# Patient Record
Sex: Female | Born: 2000 | Race: Black or African American | Hispanic: No | Marital: Single | Smoking: Never smoker
Health system: Southern US, Community
[De-identification: ages and names within clinical notes are randomized; demographics above are authoritative.]

## PROBLEM LIST (undated history)

## (undated) ENCOUNTER — Emergency Department (HOSPITAL_COMMUNITY): Admission: EM | Payer: Managed Care, Other (non HMO)

## (undated) DIAGNOSIS — Z789 Other specified health status: Secondary | ICD-10-CM

## (undated) DIAGNOSIS — R569 Unspecified convulsions: Secondary | ICD-10-CM

## (undated) DIAGNOSIS — T7840XA Allergy, unspecified, initial encounter: Secondary | ICD-10-CM

## (undated) HISTORY — DX: Unspecified convulsions: R56.9

## (undated) HISTORY — DX: Other specified health status: Z78.9

## (undated) HISTORY — DX: Allergy, unspecified, initial encounter: T78.40XA

## (undated) HISTORY — PX: NO PAST SURGERIES: SHX2092

---
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
## 2018-01-27 ENCOUNTER — Ambulatory Visit (INDEPENDENT_AMBULATORY_CARE_PROVIDER_SITE_OTHER): Payer: Self-pay | Admitting: *Deleted

## 2018-01-27 DIAGNOSIS — Z3201 Encounter for pregnancy test, result positive: Secondary | ICD-10-CM

## 2018-01-27 LAB — POCT PREGNANCY, URINE: PREG TEST UR: POSITIVE — AB

## 2018-01-27 NOTE — Progress Notes (Signed)
Chart reviewed for nurse visit. Agree with plan of care.   Marylene Land, CNM 01/27/2018 6:52 PM

## 2018-01-27 NOTE — Progress Notes (Signed)
Pt presents for possible pregnancy.  Pregnancy test resulted positive.  LMP: 01/01/18  EDD: 10/08/18  GA: [redacted]w[redacted]d Allergies and medications verified.  Safe to take medications list given to pt.  Pt advised to begin prenatal care. Proof of pregnancy letter provided by the front office.

## 2018-04-08 NOTE — L&D Delivery Note (Signed)
Patient: Stephanie Cabrera MRN: [id number]  GBS status: Positive, IAP given: PCN   Patient is a 18 y.o. now G1P1 s/p NSVD at [redacted]w[redacted]d, who was admitted for early labor with variable decels. SROM 3h 56m prior to delivery with clear fluid.    Delivery Note At 9:45 AM a viable female was delivered via Vaginal, Spontaneous (Presentation: OA ).  APGAR: 8, 9; weight 3500g.   Placenta status: spontaneous, intact.  Cord: 3 vessel  with the following complications: nuchal x1.   Anesthesia:  Epidural  Episiotomy: None Lacerations: Sulcus Bilaterally, Right Labial  Suture Repair: 3.0 vicryl rapide and 3.0 monocryl  Est. Blood Loss (mL):  495   Head delivered OA.  Nuchal x1, reduced at perineum. Shoulder and body delivered in usual fashion. Infant with spontaneous cry, placed on mother's abdomen, dried and bulb suctioned. Cord clamped x 2 after 1-minute delay, and cut by family member. Cord blood drawn. Placenta delivered spontaneously with gentle cord traction. Fundus firm with massage and Pitocin. Perineum and vagina inspected and found to have bilateral sulcus lacerations which were repaired with 3.0 vicryl rapide. Also noted to have right labial laceration that was repaired with 3.0 monocryl. Good hemostasis was achieved with repair.   Mom to postpartum.  Baby to Couplet care / Skin to Skin.  De Hollingshead 09/06/2018, 10:14 AM

## 2018-07-06 ENCOUNTER — Telehealth: Payer: Self-pay | Admitting: Licensed Clinical Social Worker

## 2018-07-06 ENCOUNTER — Other Ambulatory Visit: Payer: Self-pay | Admitting: *Deleted

## 2018-07-06 ENCOUNTER — Ambulatory Visit (INDEPENDENT_AMBULATORY_CARE_PROVIDER_SITE_OTHER): Payer: Managed Care, Other (non HMO) | Admitting: Student

## 2018-07-06 ENCOUNTER — Encounter: Payer: Self-pay | Admitting: Student

## 2018-07-06 ENCOUNTER — Other Ambulatory Visit: Payer: Self-pay

## 2018-07-06 DIAGNOSIS — Z113 Encounter for screening for infections with a predominantly sexual mode of transmission: Secondary | ICD-10-CM | POA: Diagnosis not present

## 2018-07-06 DIAGNOSIS — Z3A26 26 weeks gestation of pregnancy: Secondary | ICD-10-CM

## 2018-07-06 DIAGNOSIS — Z3482 Encounter for supervision of other normal pregnancy, second trimester: Secondary | ICD-10-CM

## 2018-07-06 DIAGNOSIS — O0932 Supervision of pregnancy with insufficient antenatal care, second trimester: Secondary | ICD-10-CM | POA: Insufficient documentation

## 2018-07-06 NOTE — Progress Notes (Signed)
Left eye blurry, started with this pregnancy.

## 2018-07-06 NOTE — Progress Notes (Signed)
Subjective:   Stephanie Cabrera is a 18 y.o. G1P0 at [redacted]w[redacted]d by unsure LMP being seen today for her first obstetrical visit.  Her obstetrical history is significant for late to care. Patient does intend to breast feed. Pregnancy history fully reviewed.  Patient reports blurred vision. Reports blurry vision of left eye for the last month. No vision loss. Has had headaches as well. Vision change is constant & doesn't change with headaches. Does not wear glasses or contacts. No eye pain or drainage. Marland Kitchen  HISTORY: OB History  Gravida Para Term Preterm AB Living  1 0 0 0 0 0  SAB TAB Ectopic Multiple Live Births  0 0 0 0 0    # Outcome Date GA Lbr Len/2nd Weight Sex Delivery Anes PTL Lv  1 Current            Past Medical History:  Diagnosis Date  . Allergy    No past surgical history on file. No family history on file. Social History   Tobacco Use  . Smoking status: Never Smoker  . Smokeless tobacco: Never Used  Substance Use Topics  . Alcohol use: Never    Alcohol/week: 0.0 standard drinks    Frequency: Never  . Drug use: Never   No Known Allergies Current Outpatient Medications on File Prior to Visit  Medication Sig Dispense Refill  . loratadine (CLARITIN) 10 MG tablet Take 10 mg by mouth daily.     No current facility-administered medications on file prior to visit.      Exam   Vitals:   07/06/18 1344  BP: 117/80  Pulse: 94  Temp: 98 F (36.7 C)  Weight: 214 lb (97.1 kg)   Fetal Heart Rate (bpm): 154  Uterus:  Fundal Height: 31 cm  System: General: well-developed, well-nourished female in no acute distress   Skin: normal coloration and turgor, no rashes   Neurologic: oriented, normal, negative, normal mood   Extremities: normal strength, tone, and muscle mass, ROM of all joints is normal   HEENT PERRLA, extraocular movement intact and sclera clear, anicteric   Mouth/Teeth mucous membranes moist, pharynx normal without lesions and dental hygiene good   Neck  supple and no masses   Cardiovascular: regular rate and rhythm   Respiratory:  no respiratory distress, normal breath sounds   Abdomen: soft, non-tender; bowel sounds normal; no masses,  no organomegaly     Assessment:   Pregnancy: G1P0 Patient Active Problem List   Diagnosis Date Noted  . Encounter for supervision of other normal pregnancy, second trimester 07/06/2018  . Limited prenatal care in second trimester 07/06/2018     Plan:  1. Encounter for supervision of other normal pregnancy, second trimester -pt not fasting today, will return tomorrow morning for all of her labs - Tdap vaccine greater than or equal to 7yo IM - [place] MFM OB DETAIL +14 WK; Future - Obstetric Panel, Including HIV - Hemoglobinopathy Evaluation - [facility] AMB BABYSCRIPTS SCHEDULE OPTIMIZATION - Culture, OB Urine - Cervicovaginal ancillary only( [place])  2. Limited prenatal care in second trimester -d/t insurance issues.    Initial labs drawn. Tdap tomorrow Continue prenatal vitamins. Genetic Screening discussed, NIPS: pt would like NIPs. Late to care for other screening. Will check with insurance prior to ordering. Marland Kitchen Ultrasound discussed; fetal anatomic survey: ordered. Problem list reviewed and updated. The nature of [facility] with multiple MDs and other Advanced Practice Providers was explained to patient; also emphasized  that residents, students are part of our team. Routine obstetric precautions reviewed. Return return tomorrow for fasting labs.   Judeth Horn 2:50 PM 07/06/18

## 2018-07-06 NOTE — Patient Instructions (Addendum)
AREA PEDIATRIC/FAMILY PRACTICE PHYSICIANS  [place] ([address]) . [facility] o Chambliss, MD; Eniola, MD; Hale, MD; Hensel, MD; McDiarmid, MD; McIntyer, MD; Neal, MD; Walden, MD o [address] [place], [place] [address] o [phone] o Mon-Fri 8:30-12:30, 1:30-5:00 o Providers come to see babies at [facility] o Accepting Medicaid . [facility] o Limited providers who accept newborns: Koirala, MD; Morrow, MD; Wolters, MD o [address], [place], [place] [address] o [phone] o Mon-Fri 8:00-5:30 o Babies seen by providers at [facility] o Does NOT accept Medicaid o Please call early in hospitalization for appointment (limited availability)  . [facility] o Mulberry, MD o [address] [place], [place] [address] o [phone] o Mon, Tue, Thur, Fri 8:30-5:00, Wed 10:00-7:00 (closed 1-2pm) o Babies seen by [facility] providers o Accepting Medicaid . [facility] - Pediatrician o Rubin, MD o [address], [place], [place] [address] o [phone] o Mon-Fri 8:30-5:00, Sat 8:30-12:00 o Provider comes to see babies at [facility] o Accepting Medicaid o Must have been referred from current patients or contacted office prior to delivery . [facility] ([facility]) o Brown, MD; Chandler, MD; Ettefagh, MD; Grant, MD; Lester, MD; McCormick, MD; McQueen, MD; Prose, MD; Simha, MD; Stanley, MD; Stryffeler, NP; Tebben, NP o [address], [place], [place] [address] o [phone] o Mon, Tue, Thur, Fri 8:30-5:30, Wed 9:30-5:30, Sat 8:30-12:30 o Babies seen by [facility] providers o Accepting Medicaid o Only accepting infants of first-time parents or siblings of current patients o Hospital discharge coordinator will make follow-up appointment . Jack Amos o [address],  [place], [place]  [address] o [phone]   Fax - [phone] . [facility] o [address], [place], [place]  [address] o Phone - [phone]   Fax - [phone] . Shilpa Gosrani o [address], [place], [place]  [address] o [phone]  [place] ([address]) . [facility] o Bates, MD; [place], MD; Cooper, Cox, MD; MD; Davis, MD; Dovico, MD; Ettefaugh, MD; Little, MD; Lowe, MD; Keiffer, MD; Melvin, MD; Sumner, MD; Williams, MD o [address], [place], [place] [address] o [phone] o Mon-Fri 8:30-5:00 (extended evenings Mon-Thur as needed), Sat-Sun 10:00-1:00 o Providers come to see babies at [facility] o Accepting Medicaid for families of first-time babies and families with all children in the household age 3 and under. Must register with office prior to making appointment (M-F only). . [facility] o Henson, NP; Knapp, MD; Lalonde, MD; Tysinger, PA o [address] [place], [place] [address] o [phone] o Mon-Fri 8:00-5:00 o Babies seen by providers at [facility] o Does NOT accept Medicaid/Commercial Insurance Only . [facility] ([facility]  o Artis, MD; Barnes, MD; Bratton, MD; Coccaro, MD; [place] Gardner, MD; Kramer, MD; Marshall, MD; Netherton, MD; Poleto, MD; Skinner, MD o [address] [place], [place] [address] o [phone] o Mon-Fri 8:30-5:30, Sat (Oct.-Mar.) 9:00-1:00 o Babies seen by providers at [facility] o Accepting Medicaid  [place] ([address]) . [facility] o Reid, MD; Warner, MD o [address], [place],  [address] o [phone] o Mon-Fri 8:30-5:00, Sat 8:30-12:00 o Providers come to see babies at [facility] o Does NOT accept Medicaid . [facility] o Becker, PA; Hagler, MD; Scifres, PA; Sun, MD; Swayne, MD o [address],  [place], [place] [address] o [phone] o Mon-Fri 8:00-5:00 o Babies seen by providers at [facility] o Does NOT accept Medicaid o Only accepting babies of parents who are patients o Please call early in hospitalization for appointment (limited availability) . [facility] o Clark, MD; Frye, MD; Kelleher, MD; Mack, NP; Miller, MD; O'Keller, MD; Patterson, NP; Pudlo, MD; Puzio, MD; Thomas, MD; Tucker, MD; Twiselton, MD o [address], [place], [place] [address] o [phone] o Mon-Fri 8:00-5:00, Sat 9:00-12:00 o Providers come to see babies at [facility] o Does NOT accept Medicaid  [place] ([address]) . [facility] o Limited providers accepting new patients: Brake, NP; Wharton, PA o [address], [place], [place] [address] o [phone] o Mon-Fri 8:00-5:00 o Babies seen by providers at [facility] o Does NOT accept Medicaid o Only accepting babies of parents who are patients o Please call early in hospitalization for appointment (limited availability) . [facility] o Gay, MD; Quinlan, MD o [address] [place], [place] [address] o [phone] (press 1 to schedule appointment) o Mon-Fri 8:00-5:00 o Providers come to see babies at [facility] o Does NOT accept Medicaid . [facility] o Mazer, MD o [address] [place], [place] [address] o [phone] o Mon-Fri 8:30-5:00 (lunch 12:30-1:00), extended hours by appointment only Wed 5:00-6:30 o Babies seen by [facility] providers o Accepting Medicaid . [facility] o Banks, MD; Jordan, MD; Koberlein, MD o [address], [place], [place] [address] o [phone] o Mon-Fri 8:00-5:00 o Babies seen by [facility] providers o Does NOT accept Medicaid . [facility] o Parker, MD; [place], MD; Wallace, DO o [address] [place], [place]  [address] o [phone] o Mon-Fri 8:00-5:00 o Babies seen by [facility] providers o Does NOT accept Medicaid . [facility] o Brandon, PA; Brecken, PA; Christy, NP; Dees, MD; DeClaire, MD; DeWeese, MD; Hansen, NP; Mills, NP; Parrish, NP; Smoot, NP; Summer, MD; Vapne, MD o [address] [place], [place] [address] o [phone] o Mon-Fri 8:30-5:00, Sat 10:00-1:00 o Providers come to see babies at [facility] o Does NOT accept Medicaid o Free prenatal information session Tuesdays at 4:45pm . [facility] o Bouska, MD; Gordon, PA; Jeffery, PA; Weber, PA o [address] [place] [address] o [phone] o Mon-Fri 7:30-5:30 o Babies seen by [facility] providers . [facility] o [address], [place], [place], [place]  [address] o [phone]   Fax - [phone]  [place] ([phone]) . [facility] o Reese, [place] o [address] [place], [place] [address] o [phone] o Mon-Thur 8:00-6:00 o Providers come to see babies at [facility] o Accepting Medicaid . [facility] o Anderson, NP; Badger, MD; Beal, PA; Spencer, PA o [address] [place],  [address] o [phone] o Mon-Thur 7:30-7:30, Fri 7:30-4:30 o Babies seen by [facility] providers o Accepting Medicaid . [facility] o Agbuya, MD; Klett, NP; Romgoolam, MD o [address], ,  [address] o [phone] o Mon-Fri 8:30-5:00, Sat 8:30-12:00 o Providers come to see babies at [facility] o Accepting Medicaid o Must have "Meet & Greet" appointment at office prior to delivery . [facility]  ([facility] ) o McCord,   MD; Juleen China, MD; Clydene Laming, [address], [place], [place] [address] o [phone] o Mon-Wed 8:00-6:00, Thur-Fri 8:00-5:00, Sat 9:00-12:00 o Providers come to  see babies at [facility] o Does NOT accept Medicaid o Only accepting siblings of current patients . [facility] [place], [place], [place], [place]  [address] o [phone]   Fax [phone] . [facility] [place] [address], [place], [place]  [address] o [phone]   Fax - Morton Gorman [phone] & [phone]) . Therapist, music at [place], DO; [place], [address] [place], [place] [address] o [phone] o Mon-Fri 7:00-5:00 o Babies seen by [facility] providers o Does NOT accept Medicaid . [place], MD; [place], [place]; [place], [place], [place], [place] [address] o [phone] o Mon-Fri 8:00-5:00 o Babies seen by [facility] providers o Accepting Medicaid . Lamont, MD; [place], [place]; [place], NP; [place], [place], [place], [place] [address] o [phone] o Mon-Fri 8:00-5:00 o Babies seen by providers at [facility] [place] [phone]) . [facility] [place], [place] o [address] [place], [place] [address] o [phone] o Mon-Fri 8:00-5:00 o Babies seen by [facility] providers o Does NOT accept Medicaid o Limited availability, please call early in hospitalization to schedule follow-up . [facility] Leilani Merl, PA; Maisie Fus, MD; [place], MD; [place], [place]; Jeannine Kitten, MD; [place], [place] [facility] [address], [place], [place] [address] o [phone] o Mon-Fri 8:30-5:00, Sat 9:00-12:00 o Babies seen by providers at [facility] o Accepting Medicaid o Please register online then schedule online or call office o [email or web address] . [place] [facility]  [place]) Kristian Covey, NP; Dwyane Dee, MD; Leonidas Romberg, PA o [address] [place], [place], [place] [address] o [phone] o Mon-Fri 8:00-5:00 o Babies seen by providers at [facility] o Accepting Medicaid . [place] [facility]) Dairl Ponder, MD; Rayvon Char, NP; Melina Modena, MD o [address] [place], [place], [place] [address] o [phone] o Mon-Fri 8:00-5:30, Sat&Sun by appointment (phones open at 8:30) o Babies seen by [facility] providers o Accepting Medicaid o Must be a first-time baby or sibling of current patient . [place], [address], [place], [place]  [address] o [phone]   Fax - [phone]  [place] [phone] & [phone]) . [place], [place]; [place], [place]; Benjamine Mola, MD; [place], [place]; Harrell Lark, MD o [address] [place], [place] [address] o [phone] o Mon-Thur 8:00-7:00, Fri 8:00-5:00, Sat 8:00-12:00, Sun 9:00-12:00 o Babies seen by [facility] providers o Accepting Medicaid . [facility] at [facility], MD; Ruthann Cancer, MD; [facility], MD o [address], [place], [place] [address] o [phone] o Mon-Thur 8:00-5:00 o Babies seen by providers at [facility] o Accepting Medicaid . [facility] [place], MD; Coe[place], MD; Amedeo Plenty, MD; Bobby Rumpf, MD; List, MD; Lavonia Drafts, MD; Ruthann Cancer, MD; Selinda Eon, MD; Audie Box, MD; Jim Like, MD; Christie Nottingham, MD; Hubbard Hartshorn, MD; Modena Nunnery, MD o [address] [place], [place]  [address] o [phone] o Mon-Fri 8:00-5:30, Sat (Oct.-Mar.) 9:00-1:00 o Babies seen by providers at [facility] o Accepting Medicaid o Must fill out new patient packet, available online at [email or web address] . [facility] [place] [facility]) Simone Curia, NP; Tiburcio Pea, NP; Tresa Endo, NP; Whitney Post, MD;  [place], [place]; Hennie Duos, MD; Wynne Dust, MD; Kavin Leech, NP o [address], [place], [place] [address] o [phone] o Mon-Thur 8:00-5:30, Fri 8:00-5:00 o Babies seen by providers at [facility] o Accepting [facility] [phone]) . [facility] o [place], [place]; Warren, MD; Tanya Nones, MD; [place], [place] o [address] [place], [place] [address] o [phone] o Mon-Fri 8:00-5:00 o Babies seen by providers at [facility] o Accepting [facility] [phone]) . [facility] o [place], DO; Lenise Arena, MD; [place], [place] o [address], [place], [place] [address] o [phone] o Mon-Fri 8:00-5:00 o Babies seen by providers at [facility] o Does NOT accept Medicaid o Limited appointment availability, please call early in hospitalization  . Nature conservation officer at [facility] o [place], DO; [place], MD o [address], [place], [place] [address] o [phone] o Mon-Fri 8:00-5:00 o Babies seen by [facility] providers o Does NOT accept Medicaid . [facility] Lorrine Kin, MD; Ninetta Lights, MD; [place], [place]; [place], MD o [address], [place], [place] [address] o [phone] o Mon-Fri 8:00-5:00 o After hours clinic [facility] [place], [place] [address]) [phone] Mon-Fri 5:00-8:00, Sat 12:00-6:00, Sun 10:00-4:00 o Babies seen by [facility] providers o Accepting Medicaid . [facility] at [facility] o [address] [place], [place]  [address] o [phone]   Fax - [phone]  [place] [phone]) . Nature conservation officer at [facility], MD o [address] [place], [place], [place] [address] o [phone] o Mon-Fri 8:00-5:00 o Babies seen by [facility] providers o Does NOT accept Medicaid . [facility] [place] [facility] [place]) Tomi Likens, MD o [address] [place] [address] [place], [place]  [address] o [phone] o Mon-Thur 8:00-7:00, Fri 8:00-5:00, Sat 8:00-12:00 o Babies seen by providers at [facility] o Accepting Medicaid - but does not have vaccinations in office (must be received elsewhere) o Limited availability, please call early in hospitalization  [place] ([address]) . [facility]  o Wyvonne Lenz, MD o [address] [place] [address] o [phone]  Fax [phone]       Third Trimester of Pregnancy The third trimester is from week 28 through week 40 (months 7 through 9). The third trimester is a time when the unborn baby (fetus) is growing rapidly. At the end of the ninth month, the fetus is about 20 inches in length and weighs 6-10 pounds. Body changes during your third trimester Your body will continue to go through many changes during pregnancy. The changes vary from woman to woman. During the third trimester:  Your weight will continue to increase. You can expect to gain 25-35 pounds (11-16 kg) by the end of the pregnancy.  You may begin to get stretch marks on your hips, abdomen, and breasts.  You may urinate more often because the fetus is moving lower into your pelvis and pressing on your bladder.  You may develop or continue to have heartburn. This is caused by increased hormones that slow down muscles in the digestive  tract.  You may develop or continue to have constipation because increased hormones slow digestion and cause the muscles that push waste through your intestines to relax.  You may develop hemorrhoids. These are swollen veins (varicose veins) in the rectum that can itch or be painful.  You may develop swollen, bulging veins (varicose veins) in your legs.  You may have increased body aches in the pelvis, back, or thighs. This is due to weight gain and increased hormones that are relaxing your joints.  You may have changes in your hair. These can include thickening of your hair, rapid growth, and changes  in texture. Some women also have hair loss during or after pregnancy, or hair that feels dry or thin. Your hair will most likely return to normal after your baby is born.  Your breasts will continue to grow and they will continue to become tender. A yellow fluid (colostrum) may leak from your breasts. This is the first milk you are producing for your baby.  Your belly button may stick out.  You may notice more swelling in your hands, face, or ankles.  You may have increased tingling or numbness in your hands, arms, and legs. The skin on your belly may also feel numb.  You may feel short of breath because of your expanding uterus.  You may have more problems sleeping. This can be caused by the size of your belly, increased need to urinate, and an increase in your body's metabolism.  You may notice the fetus "dropping," or moving lower in your abdomen (lightening).  You may have increased vaginal discharge.  You may notice your joints feel loose and you may have pain around your pelvic bone. What to expect at prenatal visits You will have prenatal exams every 2 weeks until week 36. Then you will have weekly prenatal exams. During a routine prenatal visit:  You will be weighed to make sure you and the baby are growing normally.  Your blood pressure will be taken.  Your abdomen will be measured to track your baby's growth.  The fetal heartbeat will be listened to.  Any test results from the previous visit will be discussed.  You may have a cervical check near your due date to see if your cervix has softened or thinned (effaced).  You will be tested for Group B streptococcus. This happens between 35 and 37 weeks. Your health care provider may ask you:  What your birth plan is.  How you are feeling.  If you are feeling the baby move.  If you have had any abnormal symptoms, such as leaking fluid, bleeding, severe headaches, or abdominal cramping.  If you are using any tobacco  products, including cigarettes, chewing tobacco, and electronic cigarettes.  If you have any questions. Other tests or screenings that may be performed during your third trimester include:  Blood tests that check for low iron levels (anemia).  Fetal testing to check the health, activity level, and growth of the fetus. Testing is done if you have certain medical conditions or if there are problems during the pregnancy.  Nonstress test (NST). This test checks the health of your baby to make sure there are no signs of problems, such as the baby not getting enough oxygen. During this test, a belt is placed around your belly. The baby is made to move, and its heart rate is monitored during movement. What is false labor? False labor is a condition in which you feel small, irregular tightenings of  the muscles in the womb (contractions) that usually go away with rest, changing position, or drinking water. These are called Braxton Hicks contractions. Contractions may last for hours, days, or even weeks before true labor sets in. If contractions come at regular intervals, become more frequent, increase in intensity, or become painful, you should see your health care provider. What are the signs of labor?  Abdominal cramps.  Regular contractions that start at 10 minutes apart and become stronger and more frequent with time.  Contractions that start on the top of the uterus and spread down to the lower abdomen and back.  Increased pelvic pressure and dull back pain.  A watery or bloody mucus discharge that comes from the vagina.  Leaking of amniotic fluid. This is also known as your "water breaking." It could be a slow trickle or a gush. Let your health care provider know if it has a color or strange odor. If you have any of these signs, call your health care provider right away, even if it is before your due date. Follow these instructions at home: Medicines  Follow your health care provider's  instructions regarding medicine use. Specific medicines may be either safe or unsafe to take during pregnancy.  Take a prenatal vitamin that contains at least 600 micrograms (mcg) of folic acid.  If you develop constipation, try taking a stool softener if your health care provider approves. Eating and drinking   Eat a balanced diet that includes fresh fruits and vegetables, whole grains, good sources of protein such as meat, eggs, or tofu, and low-fat dairy. Your health care provider will help you determine the amount of weight gain that is right for you.  Avoid raw meat and uncooked cheese. These carry germs that can cause birth defects in the baby.  If you have low calcium intake from food, talk to your health care provider about whether you should take a daily calcium supplement.  Eat four or five small meals rather than three large meals a day.  Limit foods that are high in fat and processed sugars, such as fried and sweet foods.  To prevent constipation: ? Drink enough fluid to keep your urine clear or pale yellow. ? Eat foods that are high in fiber, such as fresh fruits and vegetables, whole grains, and beans. Activity  Exercise only as directed by your health care provider. Most women can continue their usual exercise routine during pregnancy. Try to exercise for 30 minutes at least 5 days a week. Stop exercising if you experience uterine contractions.  Avoid heavy lifting.  Do not exercise in extreme heat or humidity, or at high altitudes.  Wear low-heel, comfortable shoes.  Practice good posture.  You may continue to have sex unless your health care provider tells you otherwise. Relieving pain and discomfort  Take frequent breaks and rest with your legs elevated if you have leg cramps or low back pain.  Take warm sitz baths to soothe any pain or discomfort caused by hemorrhoids. Use hemorrhoid cream if your health care provider approves.  Wear a good support bra to  prevent discomfort from breast tenderness.  If you develop varicose veins: ? Wear support pantyhose or compression stockings as told by your healthcare provider. ? Elevate your feet for 15 minutes, 3-4 times a day. Prenatal care  Write down your questions. Take them to your prenatal visits.  Keep all your prenatal visits as told by your health care provider. This is important. Safety  Wear your  seat belt at all times when driving.  Make a list of emergency phone numbers, including numbers for family, friends, the hospital, and police and fire departments. General instructions  Avoid cat litter boxes and soil used by cats. These carry germs that can cause birth defects in the baby. If you have a cat, ask someone to clean the litter box for you.  Do not travel far distances unless it is absolutely necessary and only with the approval of your health care provider.  Do not use hot tubs, steam rooms, or saunas.  Do not drink alcohol.  Do not use any products that contain nicotine or tobacco, such as cigarettes and e-cigarettes. If you need help quitting, ask your health care provider.  Do not use any medicinal herbs or unprescribed drugs. These chemicals affect the formation and growth of the baby.  Do not douche or use tampons or scented sanitary pads.  Do not cross your legs for long periods of time.  To prepare for the arrival of your baby: ? Take prenatal classes to understand, practice, and ask questions about labor and delivery. ? Make a trial run to the hospital. ? Visit the hospital and tour the maternity area. ? Arrange for maternity or paternity leave through employers. ? Arrange for family and friends to take care of pets while you are in the hospital. ? Purchase a rear-facing car seat and make sure you know how to install it in your car. ? Pack your hospital bag. ? Prepare the baby's nursery. Make sure to remove all pillows and stuffed animals from the baby's crib to  prevent suffocation.  Visit your dentist if you have not gone during your pregnancy. Use a soft toothbrush to brush your teeth and be gentle when you floss. Contact a health care provider if:  You are unsure if you are in labor or if your water has broken.  You become dizzy.  You have mild pelvic cramps, pelvic pressure, or nagging pain in your abdominal area.  You have lower back pain.  You have persistent nausea, vomiting, or diarrhea.  You have an unusual or bad smelling vaginal discharge.  You have pain when you urinate. Get help right away if:  Your water breaks before 37 weeks.  You have regular contractions less than 5 minutes apart before 37 weeks.  You have a fever.  You are leaking fluid from your vagina.  You have spotting or bleeding from your vagina.  You have severe abdominal pain or cramping.  You have rapid weight loss or weight gain.  You have shortness of breath with chest pain.  You notice sudden or extreme swelling of your face, hands, ankles, feet, or legs.  Your baby makes fewer than 10 movements in 2 hours.  You have severe headaches that do not go away when you take medicine.  You have vision changes. Summary  The third trimester is from week 28 through week 40, months 7 through 9. The third trimester is a time when the unborn baby (fetus) is growing rapidly.  During the third trimester, your discomfort may increase as you and your baby continue to gain weight. You may have abdominal, leg, and back pain, sleeping problems, and an increased need to urinate.  During the third trimester your breasts will keep growing and they will continue to become tender. A yellow fluid (colostrum) may leak from your breasts. This is the first milk you are producing for your baby.  False labor is a  condition in which you feel small, irregular tightenings of the muscles in the womb (contractions) that eventually go away. These are called Braxton Hicks  contractions. Contractions may last for hours, days, or even weeks before true labor sets in.  Signs of labor can include: abdominal cramps; regular contractions that start at 10 minutes apart and become stronger and more frequent with time; watery or bloody mucus discharge that comes from the vagina; increased pelvic pressure and dull back pain; and leaking of amniotic fluid. This information is not intended to replace advice given to you by your health care provider. Make sure you discuss any questions you have with your health care provider. Document Released: 03/19/2001 Document Revised: 04/30/2016 Document Reviewed: 04/30/2016 Elsevier Interactive Patient Education  2019 [employer].

## 2018-07-06 NOTE — Telephone Encounter (Signed)
CSW A. Felton Clinton called pt to conduct contraception counseling via telehealth. No answer unable to leave message.

## 2018-07-07 ENCOUNTER — Other Ambulatory Visit (INDEPENDENT_AMBULATORY_CARE_PROVIDER_SITE_OTHER): Payer: Managed Care, Other (non HMO)

## 2018-07-07 DIAGNOSIS — O0932 Supervision of pregnancy with insufficient antenatal care, second trimester: Secondary | ICD-10-CM

## 2018-07-07 DIAGNOSIS — Z3482 Encounter for supervision of other normal pregnancy, second trimester: Secondary | ICD-10-CM

## 2018-07-07 DIAGNOSIS — Z23 Encounter for immunization: Secondary | ICD-10-CM | POA: Diagnosis not present

## 2018-07-07 NOTE — Progress Notes (Unsigned)
.  td

## 2018-07-08 ENCOUNTER — Other Ambulatory Visit: Payer: Self-pay | Admitting: *Deleted

## 2018-07-08 DIAGNOSIS — Z3482 Encounter for supervision of other normal pregnancy, second trimester: Secondary | ICD-10-CM

## 2018-07-08 LAB — OBSTETRIC PANEL, INCLUDING HIV
ANTIBODY SCREEN: NEGATIVE
BASOS: 0 %
Basophils Absolute: 0 10*3/uL (ref 0.0–0.2)
EOS (ABSOLUTE): 0.1 10*3/uL (ref 0.0–0.4)
EOS: 1 %
HIV Screen 4th Generation wRfx: NONREACTIVE
Hematocrit: 37.7 % (ref 34.0–46.6)
Hemoglobin: 12.8 g/dL (ref 11.1–15.9)
Hepatitis B Surface Ag: NEGATIVE
IMMATURE GRANS (ABS): 0 10*3/uL (ref 0.0–0.1)
Immature Granulocytes: 0 %
LYMPHS ABS: 2.6 10*3/uL (ref 0.7–3.1)
Lymphs: 28 %
MCH: 30.1 pg (ref 26.6–33.0)
MCHC: 34 g/dL (ref 31.5–35.7)
MCV: 89 fL (ref 79–97)
MONOS ABS: 0.7 10*3/uL (ref 0.1–0.9)
Monocytes: 8 %
Neutrophils Absolute: 5.8 10*3/uL (ref 1.4–7.0)
Neutrophils: 63 %
Platelets: 242 10*3/uL (ref 150–450)
RBC: 4.25 x10E6/uL (ref 3.77–5.28)
RDW: 12.3 % (ref 11.7–15.4)
RH TYPE: POSITIVE
RPR: NONREACTIVE
Rubella Antibodies, IGG: 9.47 index (ref >0.99)
WBC: 9.3 10*3/uL (ref 3.4–10.8)

## 2018-07-08 LAB — HEMOGLOBINOPATHY EVALUATION
FERRITIN: 12 ng/mL — AB (ref 15–77)
HGB S: 0 %
Hgb A2 Quant: 2.7 % (ref 1.8–3.2)
Hgb A: 97.3 % (ref 96.4–98.8)
Hgb C: 0 %
Hgb F Quant: 0 % (ref 0.0–2.0)
Hgb Solubility: NEGATIVE
Hgb Variant: 0 %

## 2018-07-08 LAB — GLUCOSE TOLERANCE, 2 HOURS W/ 1HR: Glucose, Fasting: 76 mg/dL (ref 65–91)

## 2018-07-08 LAB — CERVICOVAGINAL ANCILLARY ONLY
Chlamydia: NEGATIVE
Neisseria Gonorrhea: NEGATIVE

## 2018-07-09 ENCOUNTER — Ambulatory Visit (HOSPITAL_COMMUNITY): Payer: Managed Care, Other (non HMO) | Admitting: *Deleted

## 2018-07-09 ENCOUNTER — Ambulatory Visit (HOSPITAL_COMMUNITY)
Admission: RE | Admit: 2018-07-09 | Discharge: 2018-07-09 | Disposition: A | Discharge status: Home / Self Care | Payer: Managed Care, Other (non HMO) | Source: Ambulatory Visit | Attending: Obstetrics and Gynecology | Admitting: Obstetrics and Gynecology

## 2018-07-09 ENCOUNTER — Other Ambulatory Visit: Payer: Self-pay

## 2018-07-09 ENCOUNTER — Encounter (HOSPITAL_COMMUNITY): Payer: Self-pay

## 2018-07-09 VITALS — BP 119/62 | HR 82 | Temp 98.4°F

## 2018-07-09 DIAGNOSIS — O0930 Supervision of pregnancy with insufficient antenatal care, unspecified trimester: Secondary | ICD-10-CM

## 2018-07-09 DIAGNOSIS — O0932 Supervision of pregnancy with insufficient antenatal care, second trimester: Secondary | ICD-10-CM | POA: Diagnosis not present

## 2018-07-09 DIAGNOSIS — Z363 Encounter for antenatal screening for malformations: Secondary | ICD-10-CM | POA: Diagnosis not present

## 2018-07-09 DIAGNOSIS — Z3482 Encounter for supervision of other normal pregnancy, second trimester: Secondary | ICD-10-CM

## 2018-07-09 DIAGNOSIS — Z3A32 32 weeks gestation of pregnancy: Secondary | ICD-10-CM | POA: Diagnosis not present

## 2018-07-09 LAB — CULTURE, OB URINE

## 2018-07-09 LAB — URINE CULTURE, OB REFLEX

## 2018-07-09 IMAGING — US US MFM OB DETAIL +14 WK
1 series · 13 of 28 positions shown · non-contrast
Comparison: none

[Series 1: us mfm ob detail +14 wk · 13 of 81 slices shown]
[im 3/81]
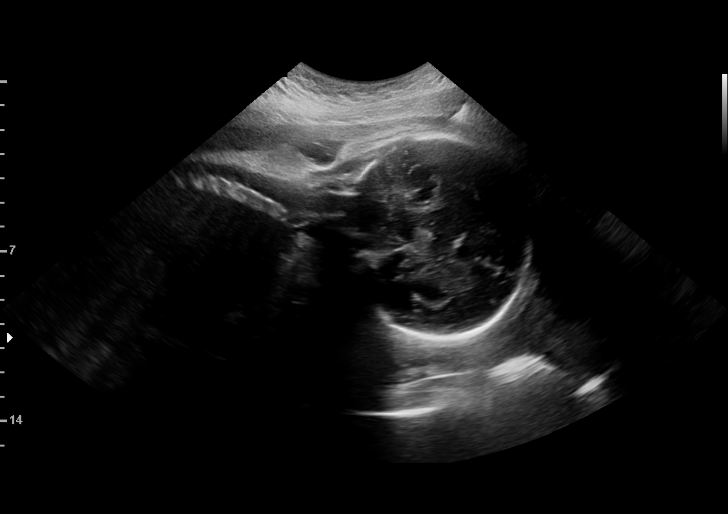
[im 9/81]
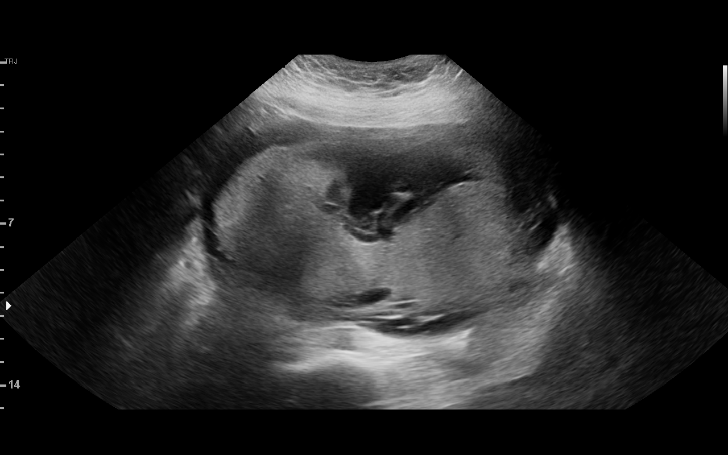
[im 15/81]
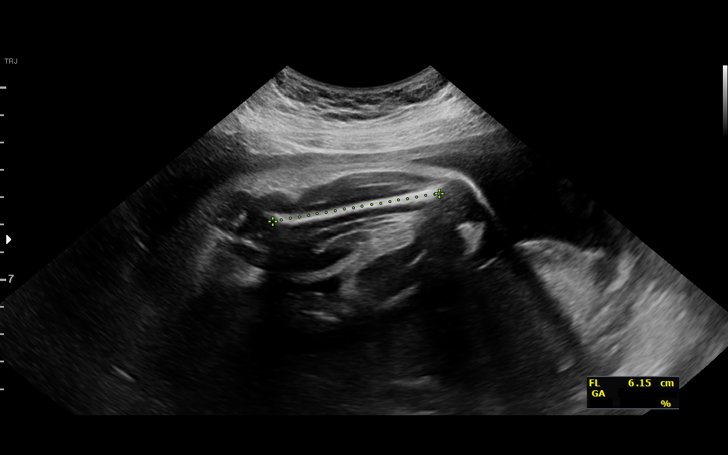
[im 21/81]
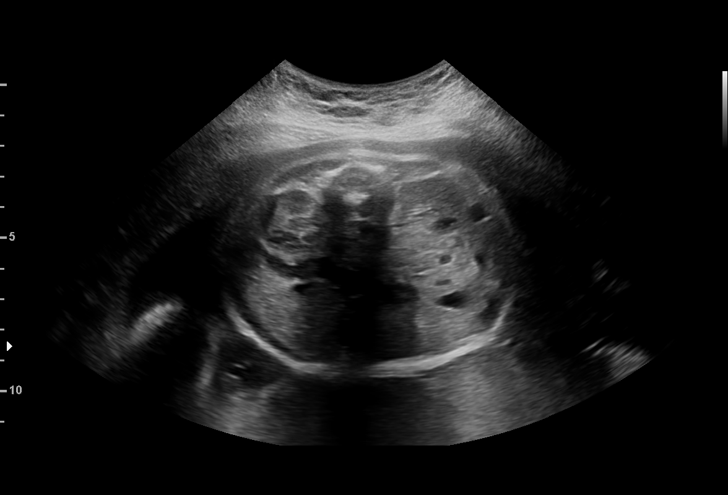
[im 27/81]
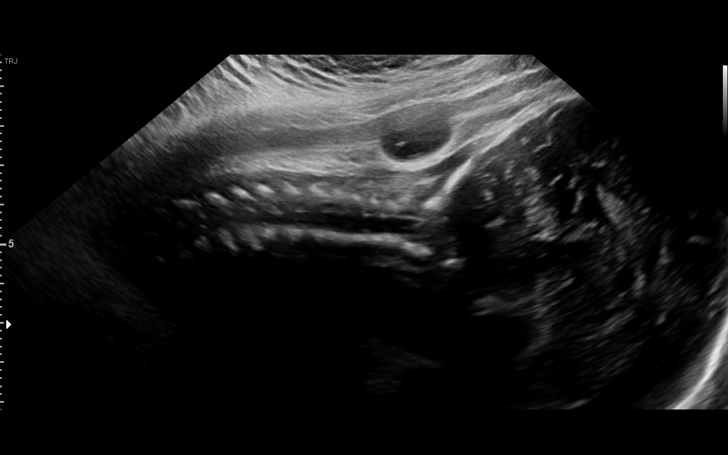
[im 33/81]
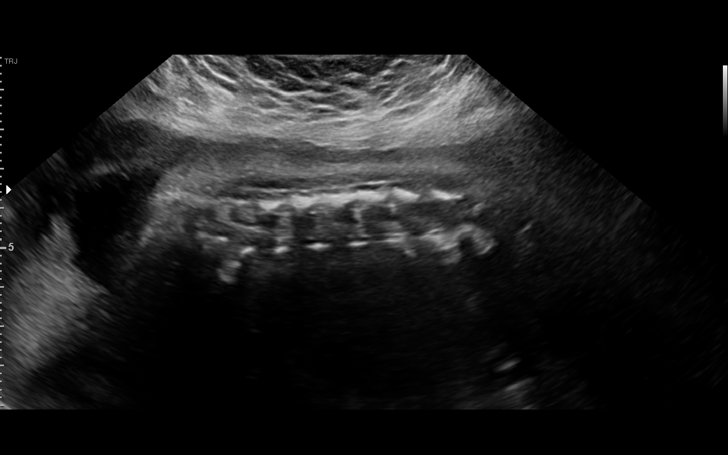
[im 42/81]
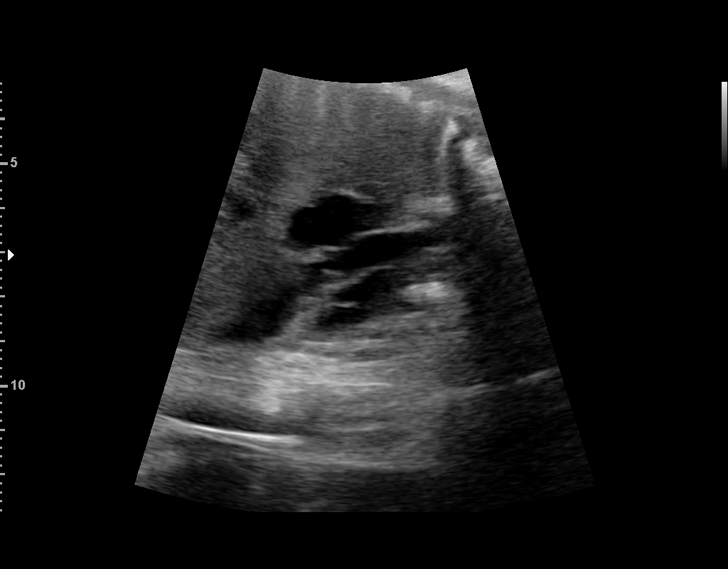
[im 48/81]
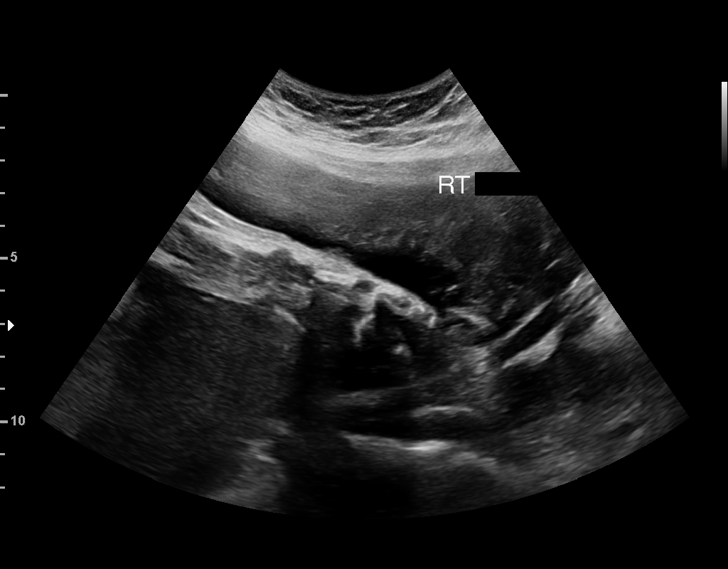
[im 54/81]
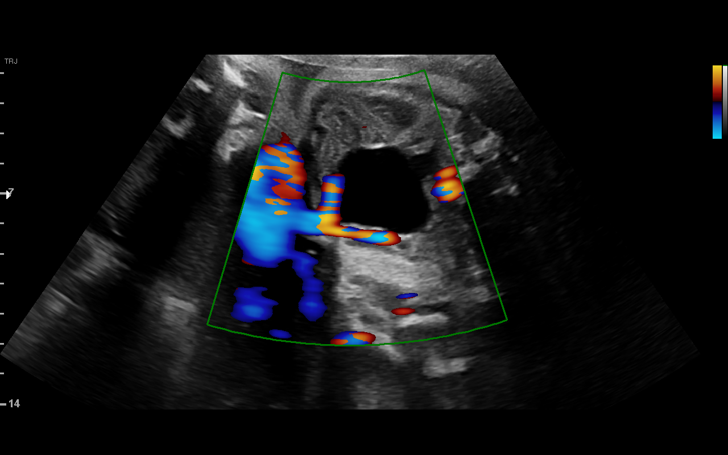
[im 60/81]
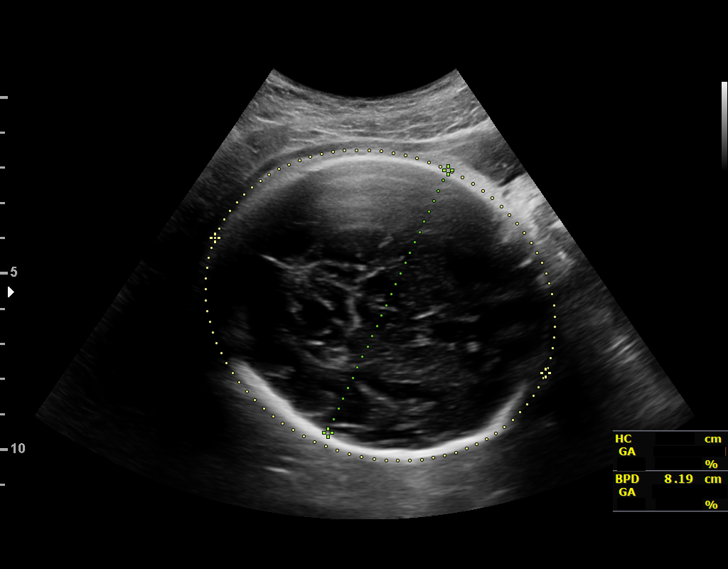
[im 66/81]
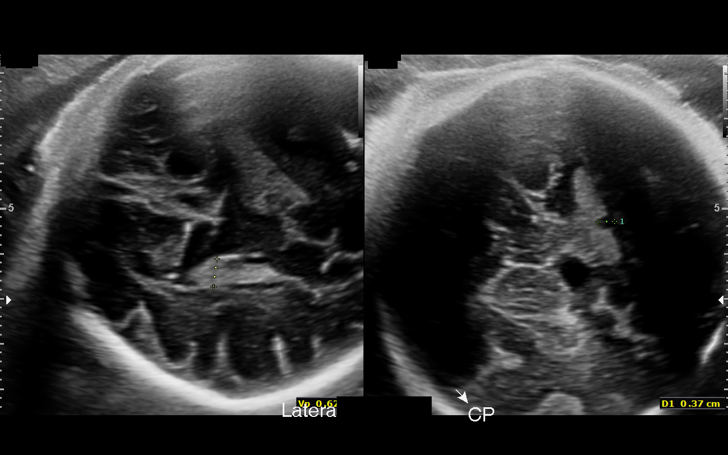
[im 72/81]
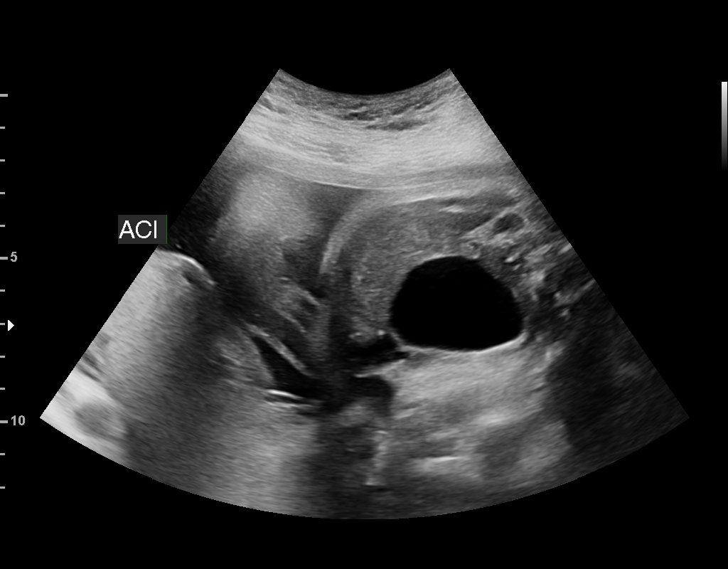
[im 78/81]
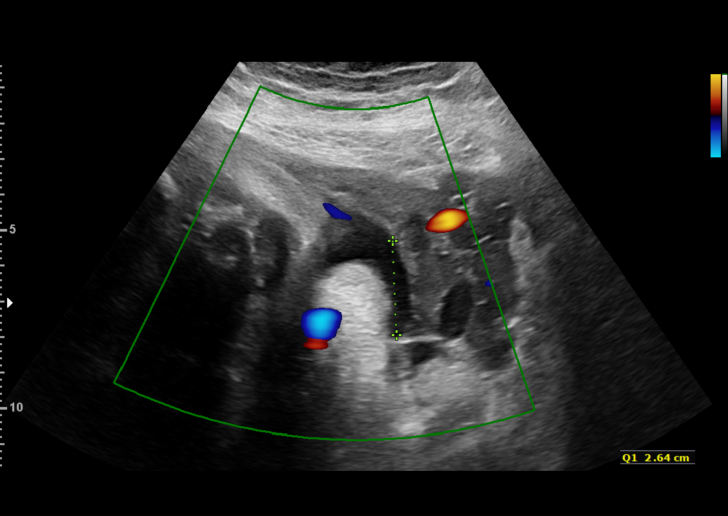

[13 of 28 positions shown; findings below may reference images not displayed]

CNM

 ----------------------------------------------------------------------

 ----------------------------------------------------------------------
Indications

  Encounter for antenatal screening for          [27]
  malformations
  Late prenatal care, second trimester           [27]
  32 weeks gestation of pregnancy
 ----------------------------------------------------------------------
Fetal Evaluation

 Num Of Fetuses:         1
 Cardiac Activity:       Observed
 Presentation:           Cephalic
 Placenta:               Posterior
 P. Cord Insertion:      Visualized

 Amniotic Fluid
 AFI FV:      Within normal limits

 AFI Sum(cm)     %Tile       Largest Pocket(cm)
 14.92           53

 RUQ(cm)       RLQ(cm)       LUQ(cm)        LLQ(cm)

Biometry

 BPD:      82.7  mm     G. Age:  33w 2d         67  %    CI:        77.77   %    70 - 86
                                                         FL/HC:      20.9   %    19.1 -
 HC:      296.8  mm     G. Age:  32w 6d         23  %    HC/AC:      1.09        0.96 -
 AC:      273.5  mm     G. Age:  31w 3d         22  %    FL/BPD:     75.1   %    71 - 87
 FL:       62.1  mm     G. Age:  32w 1d         31  %    FL/AC:      22.7   %    20 - 24
 HUM:      54.5  mm     G. Age:  31w 5d         40  %
 CER:        43  mm     G. Age:  37w 0d       > 95  %
 LV:        6.2  mm
 CM:        4.1  mm
 Est. FW:    [27]  gm      4 lb 2 oz     48  %
OB History

 Gravidity:    1
Gestational Age

 LMP:           27w 0d        Date:  [DATE]                 EDD:   [DATE]
 U/S Today:     32w 3d                                        EDD:   [DATE]
 Best:          32w 3d     Det. By:  U/S ([DATE])           EDD:   [DATE]
Anatomy

 Cranium:               Appears normal         LVOT:                   Appears normal
 Cavum:                 Appears normal         Aortic Arch:            Appears normal
 Ventricles:            Appears normal         Ductal Arch:            Appears normal
 Choroid Plexus:        Appears normal         Diaphragm:              Appears normal
 Cerebellum:            Appears normal         Stomach:                Appears normal, left
                                                                       sided
 Posterior Fossa:       Appears normal         Abdomen:                Appears normal
 Nuchal Fold:           Not applicable (>20    Abdominal Wall:         Appears nml (cord
                        wks GA)                                        insert, abd wall)
 Face:                  Not well visualized    Cord Vessels:           Appears normal (3
                                                                       vessel cord)
 Lips:                  Not well visualized    Kidneys:                Appear normal
 Palate:                Not well visualized    Bladder:                Appears normal
 Thoracic:              Appears normal         Spine:                  Appears normal
 Heart:                 Not well visualized    Upper Extremities:      Appears normal
 RVOT:                  Not well visualized    Lower Extremities:      Appears normal

 Other:  Heels visualized. Hands suboptimally visualized. Fetus appears to be
         female.
Cervix Uterus Adnexa

 Cervix
 Length:           4.06  cm.
 Normal appearance by transabdominal scan.

 Left Ovary
 Within normal limits.

 Right Ovary
 Within normal limits.
Impression

 G1 P0. Late prenatal care.

 Fetal biometry is consistent with 32-weeks' gestation.
 Amniotic fluid is normal and good fetal activity is seen. Fetal
 anatomy appears normal, but limited by advanced gestational
 age.
 We have assigned her EDD at [DATE].

 Patient will be undergoing screening for GDM tomorrow.
Recommendations

 An appointment was made for her to return in 4 weeks for
 fetal growth assessment.
                 TESSA

## 2018-07-10 ENCOUNTER — Other Ambulatory Visit: Payer: Self-pay

## 2018-07-21 ENCOUNTER — Telehealth: Payer: Self-pay | Admitting: General Practice

## 2018-07-21 NOTE — Telephone Encounter (Signed)
Patient has not entered any BP into babyscripts since appt on 3/30. Called patient to see if she has a cuff and is able to get in & log BP. Left message to call us back.  Need to ensure patient is able to get in & enter BP.

## 2018-07-24 NOTE — Telephone Encounter (Signed)
Called patient & a man answered stating she wasn't in but he would have her call us back. Told him we are checking in with her to make sure she can enter her blood pressure into the app and are trying to troubleshoot any issues with that. He states he would pass the message along.

## 2018-08-03 ENCOUNTER — Ambulatory Visit (INDEPENDENT_AMBULATORY_CARE_PROVIDER_SITE_OTHER): Payer: Managed Care, Other (non HMO) | Admitting: Advanced Practice Midwife

## 2018-08-03 ENCOUNTER — Encounter: Payer: Self-pay | Admitting: Advanced Practice Midwife

## 2018-08-03 ENCOUNTER — Other Ambulatory Visit: Payer: Self-pay

## 2018-08-03 ENCOUNTER — Telehealth: Payer: Self-pay | Admitting: Advanced Practice Midwife

## 2018-08-03 ENCOUNTER — Telehealth: Payer: Self-pay | Admitting: Licensed Clinical Social Worker

## 2018-08-03 DIAGNOSIS — Z3483 Encounter for supervision of other normal pregnancy, third trimester: Secondary | ICD-10-CM

## 2018-08-03 DIAGNOSIS — Z3480 Encounter for supervision of other normal pregnancy, unspecified trimester: Secondary | ICD-10-CM

## 2018-08-03 NOTE — Progress Notes (Signed)
I connected with  Lonny Prude on 08/03/18 at  1:35 PM EDT by telephone and verified that I am speaking with the correct person using two identifiers.   I discussed the limitations, risks, security and privacy concerns of performing an evaluation and management service by telephone and the availability of in person appointments. I also discussed with the patient that there may be a patient responsible charge related to this service. The patient expressed understanding and agreed to proceed.  Ernestina Patches, CMA 08/03/2018  1:26 PM  Needs GBS and GC/Ch MFM Updated EDD Based off 07/09/2018 was [redacted]w[redacted]d

## 2018-08-03 NOTE — Progress Notes (Signed)
   TELEHEALTH VIRTUAL OBSTETRICS VISIT ENCOUNTER NOTE  I connected with Lonny Prude on 08/03/18 at  1:35 PM EDT by telephone at home and verified that I am speaking with the correct person using two identifiers.   I discussed the limitations, risks, security and privacy concerns of performing an evaluation and management service by telephone and the availability of in person appointments. I also discussed with the patient that there may be a patient responsible charge related to this service. The patient expressed understanding and agreed to proceed.  Subjective:  Stephanie Cabrera is a 18 y.o. G1P0 at [redacted]w[redacted]d being followed for ongoing prenatal care.  She is currently monitored for the following issues for this low-risk pregnancy and has Encounter for supervision of other normal pregnancy, second trimester and Limited prenatal care in second trimester on their problem list.  Patient reports no complaints. Reports fetal movement. Denies any contractions, bleeding or leaking of fluid.   The following portions of the patient's history were reviewed and updated as appropriate: allergies, current medications, past family history, past medical history, past social history, past surgical history and problem list.   Objective:   General:  Alert, oriented and cooperative.   Mental Status: Normal mood and affect perceived. Normal judgment and thought content.  Rest of physical exam deferred due to type of encounter  Assessment and Plan:  Pregnancy: G1P0 at [redacted]w[redacted]d 1. Supervision of other normal pregnancy, antepartum - BP today at home 108/65, pulse 86 - EDC changed per MFM, next visit to be in person for GBS - Patient vomited after drinking glucola for 2 hour GTT. Due to being so far along at this time will have patient do one hour GTT at next visit.   Preterm labor symptoms and general obstetric precautions including but not limited to vaginal bleeding, contractions, leaking of fluid and fetal  movement were reviewed in detail with the patient.  I discussed the assessment and treatment plan with the patient. The patient was provided an opportunity to ask questions and all were answered. The patient agreed with the plan and demonstrated an understanding of the instructions. The patient was advised to call back or seek an in-person office evaluation/go to [facility]U at [facility] for any urgent or concerning symptoms. Please refer to After Visit Summary for other counseling recommendations.   I provided 12 minutes of non-face-to-face time during this encounter.  Return in about 1 week (around 08/10/2018) for in person visit for 36 week labs .  No future appointments.  Thressa Sheller DNP, CNM  08/03/18  1:43 PM  Center for [employer], [facility]

## 2018-08-03 NOTE — Telephone Encounter (Signed)
Called the patient to inform upcoming appointment. Educated of downloading the app and creating an account. The patient verbalized understanding.

## 2018-08-03 NOTE — Telephone Encounter (Signed)
CSW A. Felton Clinton called pt to complete contraception counseling via phone. Pt was unavailable

## 2018-08-05 ENCOUNTER — Telehealth (INDEPENDENT_AMBULATORY_CARE_PROVIDER_SITE_OTHER): Payer: Managed Care, Other (non HMO) | Admitting: *Deleted

## 2018-08-05 DIAGNOSIS — Z3490 Encounter for supervision of normal pregnancy, unspecified, unspecified trimester: Secondary | ICD-10-CM

## 2018-08-05 NOTE — Telephone Encounter (Signed)
Called pt regarding babyscripts.  Per chart review, noted that pt has never recorded a BP in the app.  Pt stated that she does not have any problems checking her blood pressure and has been doing it but has not been logging it into the app. Instructed pt how to manually enter her BP and she stated she didn't realize she had to do that.  Advised pt to check her BP weekly.  Pt verbalized understanding and stated she would take and enter a BP as soon as able today.

## 2018-08-10 ENCOUNTER — Telehealth: Payer: Self-pay | Admitting: Family Medicine

## 2018-08-10 NOTE — Telephone Encounter (Signed)
Called the patient to confirm the appointment. Left a detailed voicemail message. °

## 2018-08-11 ENCOUNTER — Other Ambulatory Visit: Payer: Self-pay

## 2018-08-11 ENCOUNTER — Ambulatory Visit (INDEPENDENT_AMBULATORY_CARE_PROVIDER_SITE_OTHER): Payer: Managed Care, Other (non HMO) | Admitting: Advanced Practice Midwife

## 2018-08-11 VITALS — BP 136/87 | HR 125 | Temp 98.2°F | Wt 207.2 lb

## 2018-08-11 DIAGNOSIS — G5600 Carpal tunnel syndrome, unspecified upper limb: Secondary | ICD-10-CM

## 2018-08-11 DIAGNOSIS — O26893 Other specified pregnancy related conditions, third trimester: Secondary | ICD-10-CM

## 2018-08-11 DIAGNOSIS — Z3403 Encounter for supervision of normal first pregnancy, third trimester: Secondary | ICD-10-CM

## 2018-08-11 DIAGNOSIS — Z113 Encounter for screening for infections with a predominantly sexual mode of transmission: Secondary | ICD-10-CM

## 2018-08-11 DIAGNOSIS — Z3A37 37 weeks gestation of pregnancy: Secondary | ICD-10-CM

## 2018-08-11 DIAGNOSIS — O26899 Other specified pregnancy related conditions, unspecified trimester: Secondary | ICD-10-CM

## 2018-08-11 NOTE — Patient Instructions (Signed)

## 2018-08-11 NOTE — Progress Notes (Addendum)
   PRENATAL VISIT NOTE  Subjective:  Stephanie Cabrera is a 18 y.o. G1P0 at [redacted]w[redacted]d being seen today for ongoing prenatal care.  She is currently monitored for the following issues for this low-risk pregnancy and has Encounter for supervision of other normal pregnancy, second trimester and Limited prenatal care in second trimester on their problem list.   Patient reports intermittent numbness in hands. This is a new problem, onset with third trimester. She states her hands are non-functional and she feels that she can no longer use her arms.  Contractions: Irritability. Vag. Bleeding: None.  Movement: Present. Denies leaking of fluid.   Patient verbalizes that she smells like marijuana in clinic today because her boyfriend smokes "all the time". She denies THC use.   The following portions of the patient's history were reviewed and updated as appropriate: allergies, current medications, past family history, past medical history, past social history, past surgical history and problem list. Problem list updated.  Objective:   Vitals:   08/11/18 1627  BP: 136/87  Pulse: (!) 125  Temp: 98.2 F (36.8 C)  Weight: 207 lb 3.2 oz (94 kg)    Fetal Status: Fetal Heart Rate (bpm): 148   Movement: Present  Presentation: Vertex  General:  Alert, oriented and cooperative. Patient is in no acute distress.  Skin: Skin is warm and dry. No rash noted.   Cardiovascular: Normal heart rate noted  Respiratory: Normal respiratory effort, no problems with respiration noted  Abdomen: Soft, gravid, appropriate for gestational age.  Pain/Pressure: Present     Pelvic: Cervical exam deferred        Extremities: Normal range of motion.  Edema: Trace  Mental Status:  Normal behavior. Normal judgment and thought content.   Assessment and Plan:  Pregnancy: G1P0 at [redacted]w[redacted]d  1. Supervision of low-risk first pregnancy, third trimester - Continue routine care - Received BP cuff yesterday. Reminded to take blood pressure  once per week.  - Provided education regarding elevated pressure, severe signs and symptoms - Patient signed in at 1623 for her 1555 appointment. Clinic was not able to accommodate her one-hour GTT.   - Patient's weight 207 lb today, down from 214 lb on 03/30. No change in behavior or eating pattern per patient. Appropriate fundal height - Explained to patient that close proximity to significant marijuana use and/or occasionally using herself may be the source of tingling in her hands.  - GC/Chlamydia probe amp ()not at [facility] - Culture, beta strep (group b only)  2. Carpal tunnel syndrome during pregnancy - Discussed normal physical exam. Extremities well-perfused and functional.  - Advised warm compresses, Tylenol for discomfort, gentle stretch, modify repetitive tasks. - Consider splint at bedtime if persists.  Preterm labor symptoms and general obstetric precautions including but not limited to vaginal bleeding, contractions, leaking of fluid and fetal movement were reviewed in detail with the patient. Please refer to After Visit Summary for other counseling recommendations.   Return in about 1 week (around 08/18/2018).  Future Appointments  Date Time Provider Department Center  08/14/2018 10:30 AM [facility]  08/18/2018  3:15 PM Kooistra, Charlesetta Garibaldi, CNM [facility]    Calvert Cantor, [place]

## 2018-08-13 LAB — GC/CHLAMYDIA PROBE AMP (~~LOC~~) NOT AT ARMC
Chlamydia: NEGATIVE
Neisseria Gonorrhea: NEGATIVE

## 2018-08-14 ENCOUNTER — Other Ambulatory Visit: Payer: Managed Care, Other (non HMO)

## 2018-08-14 ENCOUNTER — Other Ambulatory Visit: Payer: Self-pay | Admitting: *Deleted

## 2018-08-14 ENCOUNTER — Other Ambulatory Visit: Payer: Self-pay

## 2018-08-14 DIAGNOSIS — Z3403 Encounter for supervision of normal first pregnancy, third trimester: Secondary | ICD-10-CM

## 2018-08-14 LAB — CULTURE, BETA STREP (GROUP B ONLY): Strep Gp B Culture: POSITIVE — AB

## 2018-08-15 ENCOUNTER — Encounter: Payer: Self-pay | Admitting: Advanced Practice Midwife

## 2018-08-15 DIAGNOSIS — B951 Streptococcus, group B, as the cause of diseases classified elsewhere: Secondary | ICD-10-CM | POA: Insufficient documentation

## 2018-08-15 LAB — GLUCOSE, 1 HOUR GESTATIONAL: Gestational Diabetes Screen: 101 mg/dL (ref 65–139)

## 2018-08-17 ENCOUNTER — Telehealth: Payer: Self-pay | Admitting: Obstetrics & Gynecology

## 2018-08-17 NOTE — Telephone Encounter (Signed)
Called the patient to confirm the appointment, the patient verbalized understating.  Patient stated she has the app downloaded

## 2018-08-18 ENCOUNTER — Encounter: Payer: Managed Care, Other (non HMO) | Admitting: Student

## 2018-08-18 ENCOUNTER — Ambulatory Visit (INDEPENDENT_AMBULATORY_CARE_PROVIDER_SITE_OTHER): Payer: Managed Care, Other (non HMO)

## 2018-08-18 DIAGNOSIS — Z3403 Encounter for supervision of normal first pregnancy, third trimester: Secondary | ICD-10-CM

## 2018-08-18 DIAGNOSIS — Z3A38 38 weeks gestation of pregnancy: Secondary | ICD-10-CM

## 2018-08-18 DIAGNOSIS — O26893 Other specified pregnancy related conditions, third trimester: Secondary | ICD-10-CM

## 2018-08-18 DIAGNOSIS — G5600 Carpal tunnel syndrome, unspecified upper limb: Secondary | ICD-10-CM

## 2018-08-18 NOTE — Progress Notes (Signed)
   TELEHEALTH VIRTUAL OBSTETRICS VISIT ENCOUNTER NOTE  I connected with Lonny Prude on 08/18/18 at  4:15 PM EDT by telephone at home and verified that I am speaking with the correct person using two identifiers.   I discussed the limitations, risks, security and privacy concerns of performing an evaluation and management service by telephone and the availability of in person appointments. I also discussed with the patient that there may be a patient responsible charge related to this service. The patient expressed understanding and agreed to proceed.  Subjective:  Stephanie Cabrera is a 18 y.o. G1P0 at [redacted]w[redacted]d being followed for ongoing prenatal care.  She is currently monitored for the following issues for this low-risk pregnancy and has Encounter for supervision of other normal pregnancy, second trimester; Limited prenatal care in second trimester; and Group beta Strep positive on their problem list.  Patient reports carpal tunnel symptoms. Reports fetal movement. Denies any contractions, bleeding or leaking of fluid.   The following portions of the patient's history were reviewed and updated as appropriate: allergies, current medications, past family history, past medical history, past social history, past surgical history and problem list.   Objective:   General:  Alert, oriented and cooperative.   Mental Status: Normal mood and affect perceived. Normal judgment and thought content.  Rest of physical exam deferred due to type of encounter  Assessment and Plan:  Pregnancy: G1P0 at [redacted]w[redacted]d 1. Supervision of low-risk first pregnancy, third trimester -Unable to take BP because patient is in the car  2. Carpal tunnel syndrome during pregnancy -Patient reports no resolution in symptoms. Does not want to try wrist braces  Term labor symptoms and general obstetric precautions including but not limited to vaginal bleeding, contractions, leaking of fluid and fetal movement were reviewed in  detail with the patient.  I discussed the assessment and treatment plan with the patient. The patient was provided an opportunity to ask questions and all were answered. The patient agreed with the plan and demonstrated an understanding of the instructions. The patient was advised to call back or seek an in-person office evaluation/go to [facility]U at [facility] for any urgent or concerning symptoms. Please refer to After Visit Summary for other counseling recommendations.   I provided 10 minutes of non-face-to-face time during this encounter.  Return in about 1 week (around 08/25/2018) for Return OB visit.  Future Appointments  Date Time Provider Department Center  08/28/2018 10:35 AM Willodean Rosenthal, MD [facility]  09/04/2018  9:55 AM Allie Bossier, MD [facility]  09/11/2018 10:55 AM Katrinka Blazing, [place], CNM [facility]    Rolm Bookbinder, CNM Center for [employer], [facility]

## 2018-08-18 NOTE — Progress Notes (Signed)
Pt reports taking her blood pressure as directed. Pt is unable to take her blood pressure during the visit because she is in the car. Pt also unable to do a virtual visit because she is driving. In order to prevent rescheduling pt requested to do a telephone visit instead.

## 2018-08-28 ENCOUNTER — Ambulatory Visit (INDEPENDENT_AMBULATORY_CARE_PROVIDER_SITE_OTHER): Payer: Managed Care, Other (non HMO) | Admitting: Obstetrics & Gynecology

## 2018-08-28 ENCOUNTER — Telehealth (HOSPITAL_COMMUNITY): Payer: Self-pay | Admitting: *Deleted

## 2018-08-28 ENCOUNTER — Other Ambulatory Visit: Payer: Self-pay

## 2018-08-28 DIAGNOSIS — Z3A39 39 weeks gestation of pregnancy: Secondary | ICD-10-CM

## 2018-08-28 DIAGNOSIS — B951 Streptococcus, group B, as the cause of diseases classified elsewhere: Secondary | ICD-10-CM

## 2018-08-28 DIAGNOSIS — Z3482 Encounter for supervision of other normal pregnancy, second trimester: Secondary | ICD-10-CM

## 2018-08-28 DIAGNOSIS — O0932 Supervision of pregnancy with insufficient antenatal care, second trimester: Secondary | ICD-10-CM

## 2018-08-28 NOTE — Progress Notes (Signed)
Pt made aware of appointment with MFM on 5/26 @ 3:30. No questions or concerns.

## 2018-08-28 NOTE — Telephone Encounter (Signed)
Preadmission screen  

## 2018-08-28 NOTE — Progress Notes (Signed)
   TELEHEALTH VIRTUAL OBSTETRICS VISIT ENCOUNTER NOTE  I connected with Stephanie Cabrera on 08/28/18 at 10:35 AM EDT by telephone at home and verified that I am speaking with the correct person using two identifiers.   I discussed the limitations, risks, security and privacy concerns of performing an evaluation and management service by telephone and the availability of in person appointments. I also discussed with the patient that there may be a patient responsible charge related to this service. The patient expressed understanding and agreed to proceed.  Subjective:  Stephanie Cabrera is a 18 y.o. G1P0 at [redacted]w[redacted]d being followed for ongoing prenatal care.  She is currently monitored for the following issues for this low-risk pregnancy and has Encounter for supervision of other normal pregnancy, second trimester; Limited prenatal care in second trimester; and Group beta Strep positive on their problem list.  Patient reports that she saw a small spot of blood when wiping a couple of says ago. She read that it was normal. She has been having ctx.   Reports fetal movement. Denies any contractions, active bleeding or leaking of fluid.   The following portions of the patient's history were reviewed and updated as appropriate: allergies, current medications, past family history, past medical history, past social history, past surgical history and problem list.   Objective:   General:  Alert, oriented and cooperative.   Mental Status: Normal mood and affect perceived. Normal judgment and thought content.  Rest of physical exam deferred due to type of encounter  Assessment and Plan:  Pregnancy: G1P0 at [redacted]w[redacted]d 1. Limited prenatal care in second trimester I reviewed all of her prenatal  Visits. Pt will get a BPP at 40 weeks  Will schedule IOL at 41 weeks if undel.  F/u in 1 week for vistual  Pt given instructions for when to f/u sooner prn   2. Group beta Strep positive Needs atbx in labor. Pt is  aware.    3. Encounter for supervision of other normal pregnancy   Term labor symptoms and general obstetric precautions including but not limited to vaginal bleeding, contractions, leaking of fluid and fetal movement were reviewed in detail with the patient.  I discussed the assessment and treatment plan with the patient. The patient was provided an opportunity to ask questions and all were answered. The patient agreed with the plan and demonstrated an understanding of the instructions. The patient was advised to call back or seek an in-person office evaluation/go to MAU at [facility] for any urgent or concerning symptoms. Please refer to After Visit Summary for other counseling recommendations.   I provided 12 minutes of non-face-to-face time during this encounter.   Future Appointments  Date Time Provider Department Center  09/04/2018  9:55 AM Allie Bossier, MD [facility]  09/11/2018 10:55 AM Katrinka Blazing, [place], CNM [facility]    Willodean Rosenthal, MD Center for [facility], [facility]

## 2018-09-01 ENCOUNTER — Telehealth (HOSPITAL_COMMUNITY): Payer: Self-pay | Admitting: *Deleted

## 2018-09-01 ENCOUNTER — Ambulatory Visit (HOSPITAL_COMMUNITY)
Admission: RE | Admit: 2018-09-01 | Discharge: 2018-09-01 | Disposition: A | Discharge status: Home / Self Care | Payer: Managed Care, Other (non HMO) | Source: Ambulatory Visit | Attending: Obstetrics & Gynecology | Admitting: Obstetrics & Gynecology

## 2018-09-01 ENCOUNTER — Other Ambulatory Visit: Payer: Self-pay | Admitting: Obstetrics & Gynecology

## 2018-09-01 ENCOUNTER — Other Ambulatory Visit: Payer: Self-pay

## 2018-09-01 DIAGNOSIS — Z3482 Encounter for supervision of other normal pregnancy, second trimester: Secondary | ICD-10-CM | POA: Diagnosis not present

## 2018-09-01 DIAGNOSIS — B951 Streptococcus, group B, as the cause of diseases classified elsewhere: Secondary | ICD-10-CM | POA: Diagnosis not present

## 2018-09-01 DIAGNOSIS — O0933 Supervision of pregnancy with insufficient antenatal care, third trimester: Secondary | ICD-10-CM | POA: Diagnosis not present

## 2018-09-01 DIAGNOSIS — O480 Post-term pregnancy: Secondary | ICD-10-CM | POA: Diagnosis not present

## 2018-09-01 DIAGNOSIS — Z3A40 40 weeks gestation of pregnancy: Secondary | ICD-10-CM

## 2018-09-01 DIAGNOSIS — O0932 Supervision of pregnancy with insufficient antenatal care, second trimester: Secondary | ICD-10-CM | POA: Diagnosis not present

## 2018-09-01 DIAGNOSIS — Z362 Encounter for other antenatal screening follow-up: Secondary | ICD-10-CM | POA: Diagnosis not present

## 2018-09-01 IMAGING — US US MFM FETAL BPP WO NON STRESS
1 series · 12 of 28 positions shown · non-contrast
Comparison: none

[Series 1: us mfm fetal bpp wo non stress · 40 acquisitions, 12 frames shown]
[im 2/40]
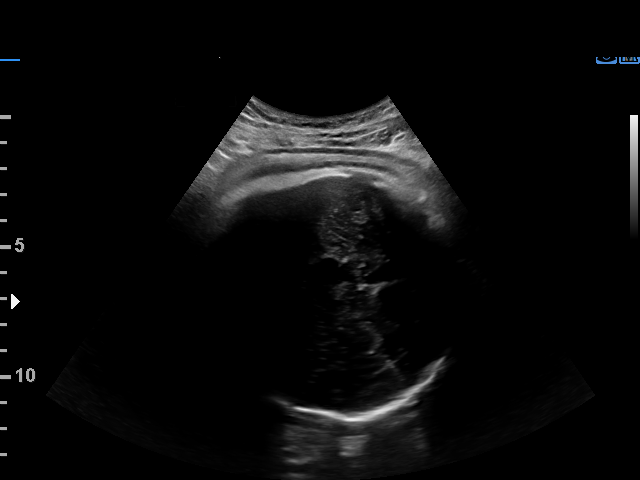
[im 5/40]
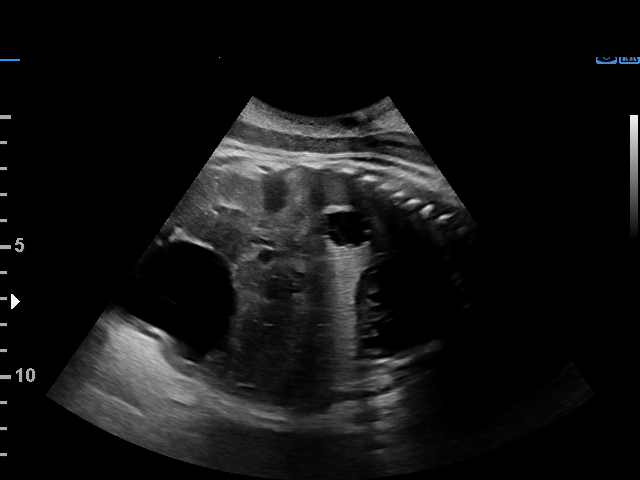
[im 8/40]
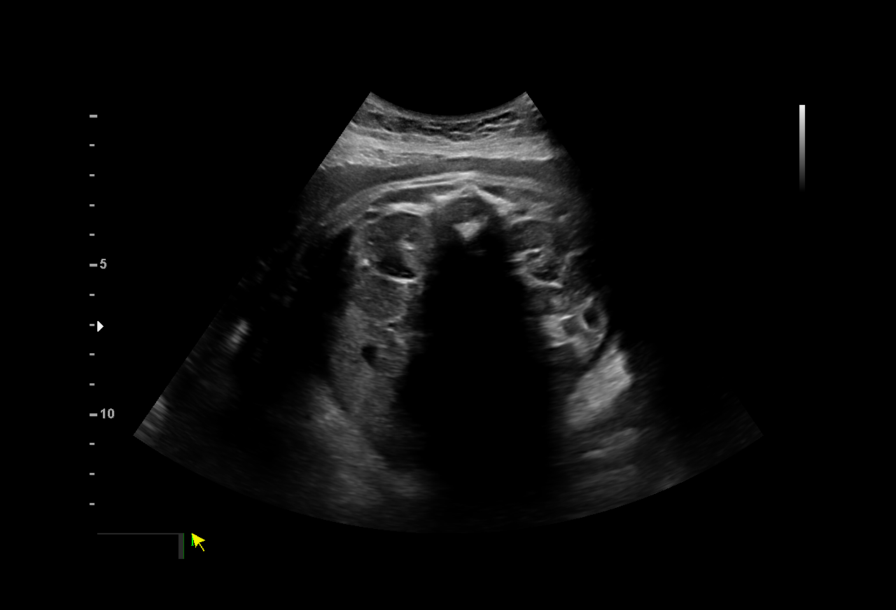
[im 12/40]
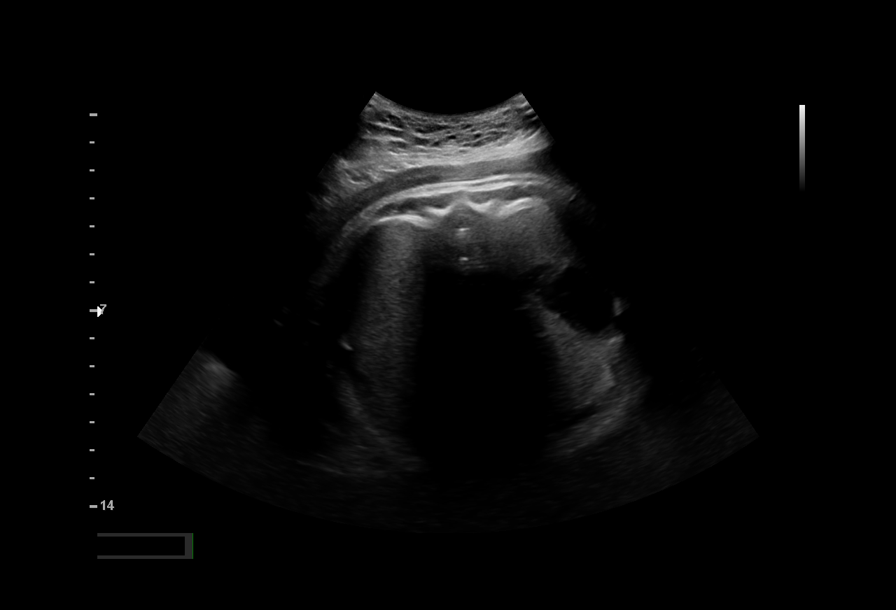
[im 15/40]
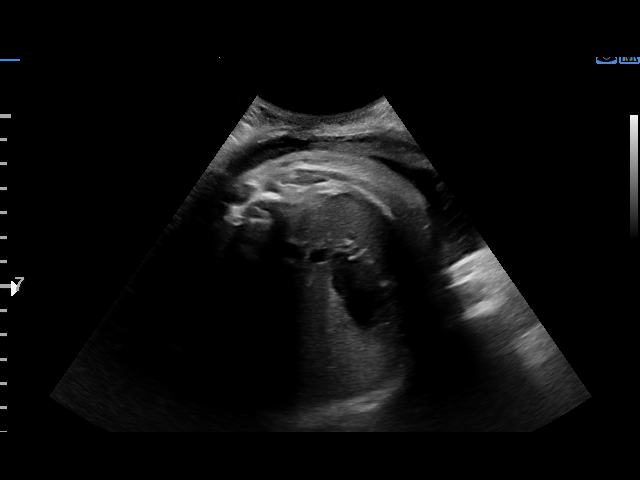
[im 18/40]
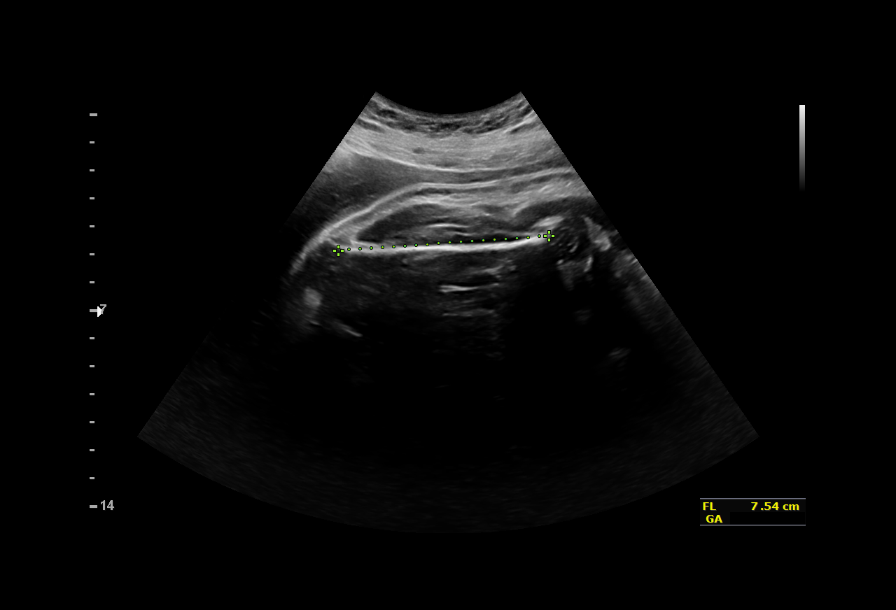
[im 22/40]
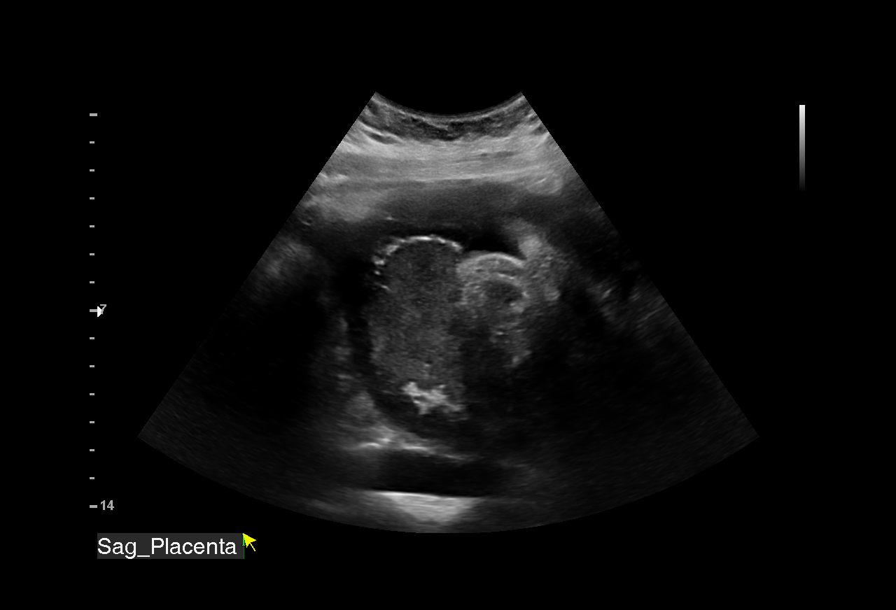
[im 25/40]
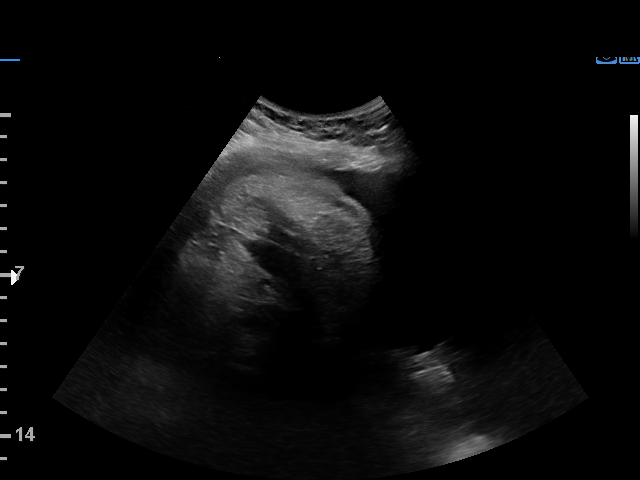
[im 28/40]
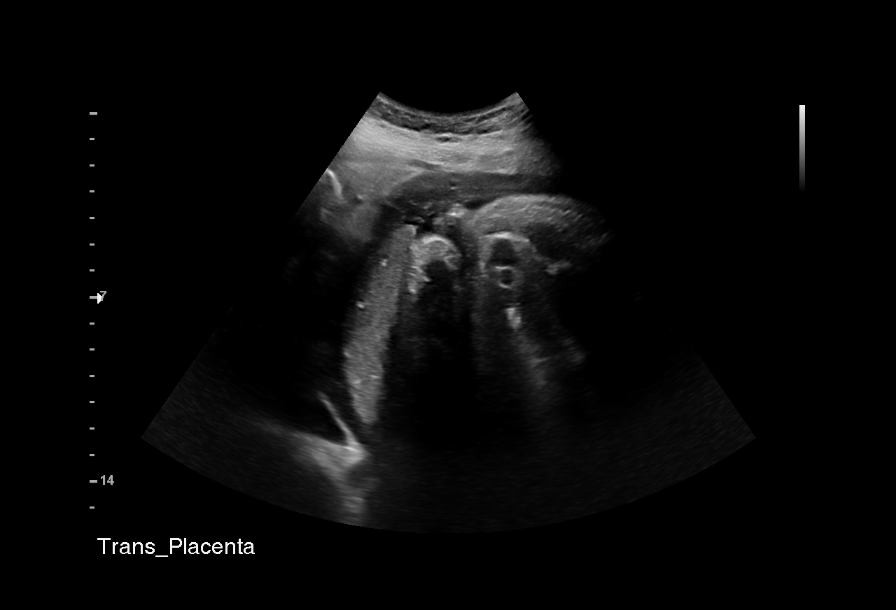
[im 32/40]
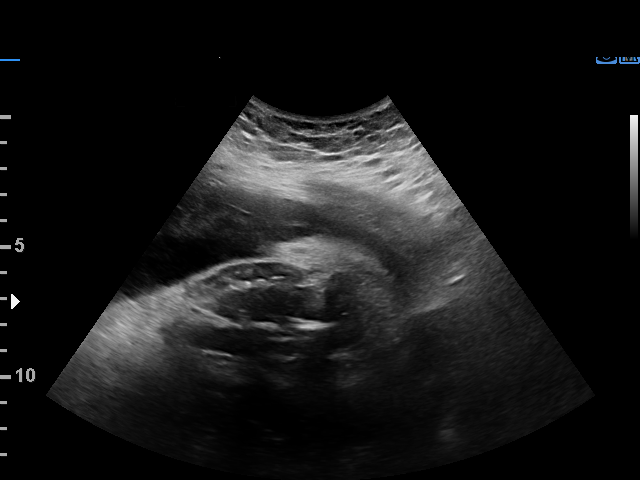
[im 35/40]
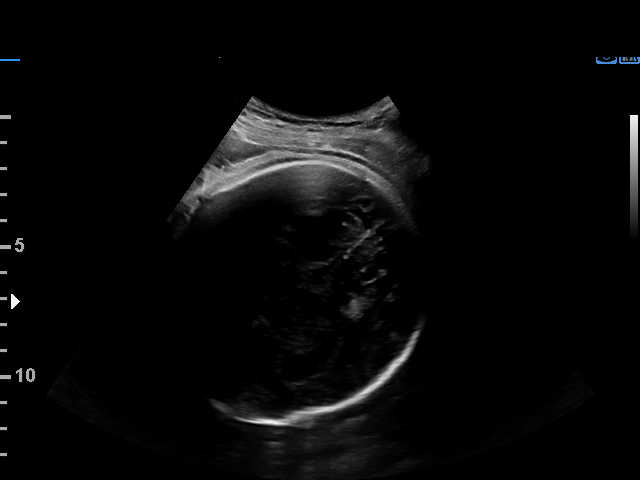
[im 38/40]
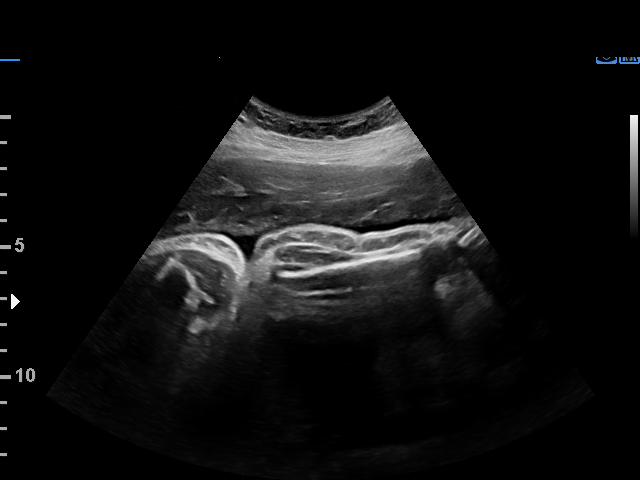

[12 of 28 positions shown; findings below may reference images not displayed]

CNM

     STRESS                                            WOLEE
                                                       WOLEE
 ----------------------------------------------------------------------

 ----------------------------------------------------------------------
Indications

  Postdate pregnancy (40-42 weeks)               [H0]
  40 weeks gestation of pregnancy
  Late to prenatal care, third trimester         [H0]
  Encounter for other antenatal screening        [H0]
  follow-up
 ----------------------------------------------------------------------
Vital Signs

 BMI:
Fetal Evaluation

 Num Of Fetuses:         1
 Fetal Heart Rate(bpm):  150
 Cardiac Activity:       Observed
 Presentation:           Cephalic
 Placenta:               Posterior
 P. Cord Insertion:      Previously Visualized

 Amniotic Fluid
 AFI FV:      Within normal limits

 AFI Sum(cm)     %Tile       Largest Pocket(cm)
 7.66            10

 RUQ(cm)       RLQ(cm)       LUQ(cm)        LLQ(cm)

Biophysical Evaluation

 Amniotic F.V:   Within normal limits       F. Tone:        Observed
 F. Movement:    Observed                   Score:          [DATE]
 F. Breathing:   Observed
Biometry

 BPD:        93  mm     G. Age:  37w 6d         33  %    CI:        81.57   %    70 - 86
                                                         FL/HC:      23.4   %    20.7 -
 HC:       325   mm     G. Age:  36w 6d                  HC/AC:      0.91        0.87 -
 AC:      356.8  mm     G. Age:  39w 4d                  FL/BPD:     81.9   %    71 - 87
 FL:       76.2  mm     G. Age:  39w 0d                  FL/AC:      21.4   %    20 - 24
 Est. FW:    [H0]  gm           8 lb     76  %
OB History

 Gravidity:    1
Gestational Age

 LMP:           34w 5d        Date:  [DATE]                 EDD:   [DATE]
 U/S Today:     38w 2d                                        EDD:   [DATE]
 Best:          40w 1d     Det. By:  U/S  ([DATE])          EDD:   [DATE]
Anatomy

 Cranium:               Appears normal         LVOT:                   Previously seen
 Cavum:                 Previously seen        Aortic Arch:            Previously seen
 Ventricles:            Previously seen        Ductal Arch:            Previously seen
 Choroid Plexus:        Previously seen        Diaphragm:              Previously seen
 Cerebellum:            Previously seen        Stomach:                Appears normal, left
                                                                       sided
 Posterior Fossa:       Previously seen        Abdomen:                Previously seen
 Nuchal Fold:           Not applicable (>20    Abdominal Wall:         Previously seen
                        wks GA)
 Face:                  Not well visualized    Cord Vessels:           Previously seen
 Lips:                  Not well visualized    Kidneys:                Appear normal
 Palate:                Not well visualized    Bladder:                Appears normal
 Thoracic:              Appears normal         Spine:                  Previously seen
 Heart:                 Not well visualized    Upper Extremities:      Previously seen
 RVOT:                  Not well visualized    Lower Extremities:      Previously seen

 Other:  Heels prev visualized. Hands prev suboptimally visualized. Fetus
         appears to be female prev seen.
Cervix Uterus Adnexa

 Cervix
 Not visualized (advanced GA >[H0])
Impression

 Post dates
 Biophysical profile [DATE]
 Suboptimal views of incomplete anatomy from prior exam
 noted secondary to gestational age.
Recommendations

 Follow up as clinically indicated.
 Consider delivery by 41 weeks

## 2018-09-01 NOTE — Telephone Encounter (Signed)
Preadmission screen  

## 2018-09-02 ENCOUNTER — Other Ambulatory Visit: Payer: Self-pay | Admitting: Advanced Practice Midwife

## 2018-09-02 ENCOUNTER — Telehealth (HOSPITAL_COMMUNITY): Payer: Self-pay | Admitting: *Deleted

## 2018-09-02 NOTE — Telephone Encounter (Signed)
Preadmission screen  

## 2018-09-03 ENCOUNTER — Telehealth (HOSPITAL_COMMUNITY): Payer: Self-pay | Admitting: *Deleted

## 2018-09-03 ENCOUNTER — Encounter (HOSPITAL_COMMUNITY): Payer: Self-pay | Admitting: *Deleted

## 2018-09-03 NOTE — Telephone Encounter (Signed)
Preadmission screen  

## 2018-09-04 ENCOUNTER — Other Ambulatory Visit: Payer: Self-pay | Admitting: Advanced Practice Midwife

## 2018-09-04 ENCOUNTER — Ambulatory Visit: Payer: Managed Care, Other (non HMO) | Admitting: Obstetrics & Gynecology

## 2018-09-04 ENCOUNTER — Other Ambulatory Visit: Payer: Self-pay

## 2018-09-04 ENCOUNTER — Other Ambulatory Visit (HOSPITAL_COMMUNITY)
Admission: RE | Admit: 2018-09-04 | Discharge: 2018-09-04 | Disposition: A | Discharge status: Home / Self Care | Payer: Managed Care, Other (non HMO) | Source: Ambulatory Visit | Attending: Obstetrics and Gynecology | Admitting: Obstetrics and Gynecology

## 2018-09-04 DIAGNOSIS — Z1159 Encounter for screening for other viral diseases: Secondary | ICD-10-CM | POA: Insufficient documentation

## 2018-09-04 NOTE — Progress Notes (Signed)
dnka

## 2018-09-04 NOTE — Progress Notes (Signed)
@  0957am  LVM to call for appointment  @1012am  No answer LVM to call to reschedule Appointment

## 2018-09-04 NOTE — MAU Note (Signed)
COVID swab completed. PT tolerated well. Pt asymptomatic

## 2018-09-05 ENCOUNTER — Encounter (HOSPITAL_COMMUNITY): Payer: Self-pay

## 2018-09-05 ENCOUNTER — Other Ambulatory Visit: Payer: Self-pay

## 2018-09-05 ENCOUNTER — Inpatient Hospital Stay (HOSPITAL_COMMUNITY)
Admission: AD | Admit: 2018-09-05 | Discharge: 2018-09-08 | DRG: 807 | Disposition: A | Discharge status: Home / Self Care | Payer: Managed Care, Other (non HMO) | Attending: Family Medicine | Admitting: Family Medicine

## 2018-09-05 DIAGNOSIS — O26893 Other specified pregnancy related conditions, third trimester: Secondary | ICD-10-CM | POA: Diagnosis present

## 2018-09-05 DIAGNOSIS — Z3A40 40 weeks gestation of pregnancy: Secondary | ICD-10-CM

## 2018-09-05 DIAGNOSIS — O480 Post-term pregnancy: Principal | ICD-10-CM | POA: Diagnosis present

## 2018-09-05 DIAGNOSIS — O99824 Streptococcus B carrier state complicating childbirth: Secondary | ICD-10-CM | POA: Diagnosis present

## 2018-09-05 DIAGNOSIS — Z1159 Encounter for screening for other viral diseases: Secondary | ICD-10-CM | POA: Diagnosis not present

## 2018-09-05 DIAGNOSIS — Z3A41 41 weeks gestation of pregnancy: Secondary | ICD-10-CM | POA: Diagnosis present

## 2018-09-05 LAB — CBC
HCT: 39.5 % (ref 36.0–46.0)
Hemoglobin: 13.4 g/dL (ref 12.0–15.0)
MCH: 29 pg (ref 26.0–34.0)
MCHC: 33.9 g/dL (ref 30.0–36.0)
MCV: 85.5 fL (ref 80.0–100.0)
Platelets: 239 10*3/uL (ref 150–400)
RBC: 4.62 MIL/uL (ref 3.87–5.11)
RDW: 12.8 % (ref 11.5–15.5)
WBC: 10.4 10*3/uL (ref 4.0–10.5)
nRBC: 0 % (ref 0.0–0.2)

## 2018-09-05 LAB — NOVEL CORONAVIRUS, NAA (HOSP ORDER, SEND-OUT TO REF LAB; TAT 18-24 HRS): SARS-CoV-2, NAA: NOT DETECTED

## 2018-09-05 LAB — TYPE AND SCREEN
ABO/RH(D): B POS
Antibody Screen: NEGATIVE

## 2018-09-05 MED ORDER — OXYTOCIN 40 UNITS IN NORMAL SALINE INFUSION - SIMPLE MED
2.5000 [IU]/h | INTRAVENOUS | Status: DC
  Administered 2018-09-06: 2.5 [IU]/h via INTRAVENOUS
  Filled 2018-09-05: qty 1000

## 2018-09-05 MED ORDER — LACTATED RINGERS IV SOLN
500.0000 mL | INTRAVENOUS | Status: DC | PRN
  Administered 2018-09-05: 1000 mL via INTRAVENOUS

## 2018-09-05 MED ORDER — FENTANYL CITRATE (PF) 100 MCG/2ML IJ SOLN: 100.0000 ug | INTRAMUSCULAR | Status: DC | PRN

## 2018-09-05 MED ORDER — EPHEDRINE 5 MG/ML INJ: 10.0000 mg | INTRAVENOUS | Status: DC | PRN

## 2018-09-05 MED ORDER — ACETAMINOPHEN 325 MG PO TABS: 650.0000 mg | ORAL_TABLET | ORAL | Status: DC | PRN

## 2018-09-05 MED ORDER — OXYTOCIN BOLUS FROM INFUSION
500.0000 mL | Freq: Once | INTRAVENOUS | Status: AC
  Administered 2018-09-06: 500 mL via INTRAVENOUS

## 2018-09-05 MED ORDER — TERBUTALINE SULFATE 1 MG/ML IJ SOLN: 0.2500 mg | Freq: Once | INTRAMUSCULAR | Status: DC | PRN

## 2018-09-05 MED ORDER — DIPHENHYDRAMINE HCL 50 MG/ML IJ SOLN: 12.5000 mg | INTRAMUSCULAR | Status: DC | PRN

## 2018-09-05 MED ORDER — LACTATED RINGERS IV SOLN
INTRAVENOUS | Status: DC
  Administered 2018-09-05: 23:00:00 via INTRAVENOUS

## 2018-09-05 MED ORDER — FENTANYL-BUPIVACAINE-NACL 0.5-0.125-0.9 MG/250ML-% EP SOLN
12.0000 mL/h | EPIDURAL | Status: DC | PRN
  Filled 2018-09-05: qty 250

## 2018-09-05 MED ORDER — LACTATED RINGERS IV SOLN
500.0000 mL | Freq: Once | INTRAVENOUS | Status: AC
  Administered 2018-09-06: 500 mL via INTRAVENOUS

## 2018-09-05 MED ORDER — PHENYLEPHRINE 40 MCG/ML (10ML) SYRINGE FOR IV PUSH (FOR BLOOD PRESSURE SUPPORT)
80.0000 ug | PREFILLED_SYRINGE | INTRAVENOUS | Status: DC | PRN
  Filled 2018-09-05: qty 10

## 2018-09-05 MED ORDER — MISOPROSTOL 25 MCG QUARTER TABLET
25.0000 ug | ORAL_TABLET | ORAL | Status: DC | PRN
  Filled 2018-09-05: qty 1

## 2018-09-05 MED ORDER — PHENYLEPHRINE 40 MCG/ML (10ML) SYRINGE FOR IV PUSH (FOR BLOOD PRESSURE SUPPORT): 80.0000 ug | PREFILLED_SYRINGE | INTRAVENOUS | Status: DC | PRN

## 2018-09-05 MED ORDER — OXYCODONE-ACETAMINOPHEN 5-325 MG PO TABS: 2.0000 | ORAL_TABLET | ORAL | Status: DC | PRN

## 2018-09-05 MED ORDER — ONDANSETRON HCL 4 MG/2ML IJ SOLN: 4.0000 mg | Freq: Four times a day (QID) | INTRAMUSCULAR | Status: DC | PRN

## 2018-09-05 MED ORDER — LIDOCAINE HCL (PF) 1 % IJ SOLN: 30.0000 mL | INTRAMUSCULAR | Status: DC | PRN

## 2018-09-05 MED ORDER — OXYCODONE-ACETAMINOPHEN 5-325 MG PO TABS: 1.0000 | ORAL_TABLET | ORAL | Status: DC | PRN

## 2018-09-05 MED ORDER — SODIUM CHLORIDE 0.9 % IV SOLN
5.0000 10*6.[IU] | Freq: Once | INTRAVENOUS | Status: AC
  Administered 2018-09-05: 5 10*6.[IU] via INTRAVENOUS
  Filled 2018-09-05: qty 5

## 2018-09-05 MED ORDER — SOD CITRATE-CITRIC ACID 500-334 MG/5ML PO SOLN: 30.0000 mL | ORAL | Status: DC | PRN

## 2018-09-05 MED ORDER — PENICILLIN G 3 MILLION UNITS IVPB - SIMPLE MED
3.0000 10*6.[IU] | INTRAVENOUS | Status: DC
  Administered 2018-09-06 (×2): 3 10*6.[IU] via INTRAVENOUS
  Filled 2018-09-05 (×4): qty 100

## 2018-09-05 MED ORDER — ZOLPIDEM TARTRATE 5 MG PO TABS: 5.0000 mg | ORAL_TABLET | Freq: Every evening | ORAL | Status: DC | PRN

## 2018-09-05 NOTE — H&P (Signed)
Stephanie Cabrera is a 18 y.o. female G1P0 at [redacted]w[redacted]d presenting for labor evaluation.  Pregnancy has been uncomplicated except for late to care.     Nursing Staff Provider  Office Location  [facility] Dating   unsure LMP, dated by 27 week [place]  Language  english Anatomy US  normal  Flu Vaccine  Declined 07/06/2018 Genetic Screen  NIPS:    AFP: late to care   First Screen:  Quad:    TDaP vaccine   07/07/2018 Hgb A1C or  GTT Early  Third trimester 1 hour in third trimester was 101  Rhogam   n/a   LAB RESULTS   Feeding Plan Breast  Blood Type   B positive  Contraception OCPs Antibody  negative  Circumcision yes Rubella  immune  Pediatrician   list given RPR   negative  Support Person Michael(FOB) HBsAg   negative  Prenatal Classes n/a HIV  negative  BTL Consent  GBS  (For PCN allergy, check sensitivities) pos  VBAC Consent  Pap  n/a    Hgb Electro   normal    CF     SMA     Waterbirth  [ ]  Class [ ]  Consent [ ]  CNM visit     OB History    Gravida  1   Para      Term      Preterm      AB      Living  0     SAB      TAB      Ectopic      Multiple      Live Births  0          Past Medical History:  Diagnosis Date  . Allergy   . Medical history non-contributory    Past Surgical History:  Procedure Laterality Date  . NO PAST SURGERIES     Family History: family history includes Diabetes in her paternal grandfather and paternal grandmother; Hypertension in her paternal grandfather. Social History:  reports that she has never smoked. She has never used smokeless tobacco. She reports that she does not drink alcohol or use drugs.     Maternal Diabetes: No Genetic Screening: Declined Maternal Ultrasounds/Referrals: Normal Fetal Ultrasounds or other Referrals:  None Maternal Substance Abuse:  No Significant Maternal Medications:  None Significant Maternal Lab Results:  Lab values include: Group B Strep positive Other Comments:  None  Review of Systems   Constitutional: Negative for chills and fever.  Respiratory: Negative for shortness of breath.   Cardiovascular: Negative for chest pain.  Gastrointestinal: Positive for abdominal pain. Negative for constipation, diarrhea and vomiting.  Neurological: Negative for dizziness and headaches.  All other systems reviewed and are negative.  Maternal Medical History:  Reason for admission: Contractions.   Contractions: Onset was 6-12 hours ago.   Frequency: regular.   Duration is approximately 1 minute.   Perceived severity is moderate.    Fetal activity: Perceived fetal activity is normal.   Last perceived fetal movement was within the past hour.    Prenatal complications: no prenatal complications Prenatal Complications - Diabetes: none.    Dilation: 1.5 Effacement (%): 70 Station: -2 Exam by:: Adah Perl RN Blood pressure 119/76, pulse 86, temperature 98.2 F (36.8 C), temperature source Oral, resp. rate 18, height 5\' 7"  (1.702 m), weight 95.7 kg, last menstrual period 01/01/2018. Maternal Exam:  Uterine Assessment: Contraction strength is moderate.  Contraction duration is 60 seconds. Contraction frequency is regular.  Abdomen: Fetal presentation: vertex  Cervix: Cervix evaluated by digital exam.     Fetal Exam Fetal Monitor Review: Mode: ultrasound.   Baseline rate: 165.  Variability: moderate (6-25 bpm).   Pattern: accelerations present and variable decelerations.    Fetal State Assessment: Category II - tracings are indeterminate.     Physical Exam  Nursing note and vitals reviewed. Constitutional: She is oriented to person, place, and time. She appears well-developed and well-nourished.  Neck: Normal range of motion.  Cardiovascular: Normal rate, regular rhythm and normal heart sounds.  Respiratory: Effort normal and breath sounds normal.  GI: Soft.  Musculoskeletal: Normal range of motion.  Neurological: She is alert and oriented to person, place, and  time.  Skin: Skin is warm and dry.  Psychiatric: She has a normal mood and affect. Her behavior is normal. Judgment and thought content normal.    Prenatal labs: ABO, Rh: B/Positive/-- (03/31 2449) Antibody: Negative (03/31 0921) Rubella: 9.47 (03/31 0921) RPR: Non Reactive (03/31 0921)  HBsAg: Negative (03/31 0921)  HIV: Non Reactive (03/31 0921)  GBS:   Positive  Assessment/Plan: 18 y.o. G1P0 at [redacted]w[redacted]d Early labor Variable decelerations COVID negative on pre-IOL testing GBS positive  Admit to L&D Expectant management PCN for GBS prophylaxis Anticipate NSVD    Sharen Counter 09/05/2018, 10:22 PM

## 2018-09-05 NOTE — MAU Note (Signed)
Presents with cxts every 4-5 min since 1700 today. No vag bldg or LOF. Pos FM.   Adah Perl RN

## 2018-09-06 ENCOUNTER — Inpatient Hospital Stay (HOSPITAL_COMMUNITY): Payer: Managed Care, Other (non HMO) | Admitting: Anesthesiology

## 2018-09-06 ENCOUNTER — Encounter (HOSPITAL_COMMUNITY): Payer: Self-pay | Admitting: *Deleted

## 2018-09-06 DIAGNOSIS — O480 Post-term pregnancy: Secondary | ICD-10-CM

## 2018-09-06 DIAGNOSIS — O99824 Streptococcus B carrier state complicating childbirth: Secondary | ICD-10-CM

## 2018-09-06 DIAGNOSIS — Z3A40 40 weeks gestation of pregnancy: Secondary | ICD-10-CM

## 2018-09-06 LAB — ABO/RH: ABO/RH(D): B POS

## 2018-09-06 LAB — RPR: RPR Ser Ql: NONREACTIVE

## 2018-09-06 MED ORDER — ONDANSETRON HCL 4 MG PO TABS: 4.0000 mg | ORAL_TABLET | ORAL | Status: DC | PRN

## 2018-09-06 MED ORDER — DIBUCAINE (PERIANAL) 1 % EX OINT: 1.0000 "application " | TOPICAL_OINTMENT | CUTANEOUS | Status: DC | PRN

## 2018-09-06 MED ORDER — MEASLES, MUMPS & RUBELLA VAC IJ SOLR: 0.5000 mL | Freq: Once | INTRAMUSCULAR | Status: DC

## 2018-09-06 MED ORDER — ACETAMINOPHEN 325 MG PO TABS: 650.0000 mg | ORAL_TABLET | ORAL | Status: DC | PRN

## 2018-09-06 MED ORDER — LACTATED RINGERS IV SOLN
INTRAVENOUS | Status: DC
  Administered 2018-09-06 (×2): via INTRAVENOUS

## 2018-09-06 MED ORDER — COCONUT OIL OIL: 1.0000 "application " | TOPICAL_OIL | Status: DC | PRN

## 2018-09-06 MED ORDER — BENZOCAINE-MENTHOL 20-0.5 % EX AERO: 1.0000 "application " | INHALATION_SPRAY | CUTANEOUS | Status: DC | PRN

## 2018-09-06 MED ORDER — SODIUM CHLORIDE (PF) 0.9 % IJ SOLN
INTRAMUSCULAR | Status: DC | PRN
  Administered 2018-09-06: 14 mL/h via EPIDURAL

## 2018-09-06 MED ORDER — ZOLPIDEM TARTRATE 5 MG PO TABS: 5.0000 mg | ORAL_TABLET | Freq: Every evening | ORAL | Status: DC | PRN

## 2018-09-06 MED ORDER — IBUPROFEN 600 MG PO TABS
600.0000 mg | ORAL_TABLET | Freq: Four times a day (QID) | ORAL | Status: DC
  Administered 2018-09-06 – 2018-09-08 (×8): 600 mg via ORAL
  Filled 2018-09-06 (×9): qty 1

## 2018-09-06 MED ORDER — LIDOCAINE-EPINEPHRINE (PF) 2 %-1:200000 IJ SOLN
INTRAMUSCULAR | Status: DC | PRN
  Administered 2018-09-06 (×2): 3 mL via EPIDURAL

## 2018-09-06 MED ORDER — SENNOSIDES-DOCUSATE SODIUM 8.6-50 MG PO TABS
2.0000 | ORAL_TABLET | ORAL | Status: DC
  Administered 2018-09-06 – 2018-09-08 (×2): 2 via ORAL
  Filled 2018-09-06 (×2): qty 2

## 2018-09-06 MED ORDER — PRENATAL MULTIVITAMIN CH
1.0000 | ORAL_TABLET | Freq: Every day | ORAL | Status: DC
  Administered 2018-09-06 – 2018-09-07 (×2): 1 via ORAL
  Filled 2018-09-06 (×3): qty 1

## 2018-09-06 MED ORDER — ONDANSETRON HCL 4 MG/2ML IJ SOLN: 4.0000 mg | INTRAMUSCULAR | Status: DC | PRN

## 2018-09-06 MED ORDER — WITCH HAZEL-GLYCERIN EX PADS: 1.0000 "application " | MEDICATED_PAD | CUTANEOUS | Status: DC | PRN

## 2018-09-06 MED ORDER — TETANUS-DIPHTH-ACELL PERTUSSIS 5-2.5-18.5 LF-MCG/0.5 IM SUSP: 0.5000 mL | Freq: Once | INTRAMUSCULAR | Status: DC

## 2018-09-06 MED ORDER — DIPHENHYDRAMINE HCL 25 MG PO CAPS: 25.0000 mg | ORAL_CAPSULE | Freq: Four times a day (QID) | ORAL | Status: DC | PRN

## 2018-09-06 MED ORDER — SIMETHICONE 80 MG PO CHEW: 80.0000 mg | CHEWABLE_TABLET | ORAL | Status: DC | PRN

## 2018-09-06 NOTE — Anesthesia Postprocedure Evaluation (Signed)
Anesthesia Post Note  Patient: Denize Hanke  Procedure(s) Performed: AN AD HOC LABOR EPIDURAL     Patient location during evaluation: L&D Anesthesia Type: Epidural Level of consciousness: awake and alert Pain management: pain level controlled Vital Signs Assessment: post-procedure vital signs reviewed and stable Respiratory status: spontaneous breathing, nonlabored ventilation and respiratory function stable Cardiovascular status: stable Postop Assessment: no headache, no backache and epidural receding Anesthetic complications: no    Last Vitals:  Vitals:   09/06/18 1320 09/06/18 1724  BP: 133/78 129/84  Pulse: 89 97  Resp: 19 18  Temp: 37.2 C 36.9 C  SpO2:      Last Pain:  Vitals:   09/06/18 1724  TempSrc: Oral  PainSc:    Pain Goal: Patients Stated Pain Goal: 3 (09/05/18 2339)                 Marrion Coy

## 2018-09-06 NOTE — Lactation Note (Signed)
This note was copied from a baby's chart. Lactation Consultation Note  Patient Name: Stephanie Cabrera EFEOF'H Date: 09/06/2018 Reason for consult: Initial assessment   P1, Baby 6 hours old and sucking pacifier upon entering. Provided education.  Pacifier use not recommended at this time.  Reviewed hand expression w/ drops expressed and assisted w/ latching baby in football hold on R breast.  Mother had minimal breast changes during pregnancy.  Reviewed basics.  Feed on demand approximately 8-12 times per day.   Lactation brochure given with our phone # for post-discharge questions.      Maternal Data Has patient been taught Hand Expression?: Yes Does the patient have breastfeeding experience prior to this delivery?: No  Feeding Feeding Type: Breast Fed  LATCH Score Latch: Grasps breast easily, tongue down, lips flanged, rhythmical sucking.  Audible Swallowing: A few with stimulation  Type of Nipple: Everted at rest and after stimulation  Comfort (Breast/Nipple): Soft / non-tender  Hold (Positioning): Assistance needed to correctly position infant at breast and maintain latch.  LATCH Score: 8  Interventions Interventions: Breast feeding basics reviewed;Support pillows;Hand express  Lactation Tools Discussed/Used     Consult Status Consult Status: Follow-up Date: 09/07/18 Follow-up type: In-patient    Dahlia Byes [facility] 09/06/2018, 3:56 PM

## 2018-09-06 NOTE — Progress Notes (Signed)
  Patient Vitals for the past 6 hrs:  BP Temp Temp src Pulse Resp SpO2 Height Weight  09/05/18 2337 133/85 98.6 F (37 C) Oral 62 18 - 5\' 7"  (1.702 m) 95.7 kg  09/05/18 2243 (!) 136/95 98.5 F (36.9 C) Oral 95 18 - - -  09/05/18 2215 119/76 - - 86 - - - -  09/05/18 2204 (!) 149/84 - - 95 - - - -  09/05/18 2157 138/84 98.2 F (36.8 C) Oral 99 18 - - -  09/05/18 2146 - - - - - - 5\' 7"  (1.702 m) 95.7 kg    Ctx more regular and increasing in strength, q 2 mi nutes.  FHR Cat 1.  cx now 2-3/90/-2.  Will not augment.

## 2018-09-06 NOTE — Anesthesia Preprocedure Evaluation (Signed)
Anesthesia Evaluation  Patient identified by MRN, date of birth, ID band Patient awake    Reviewed: Allergy & Precautions, NPO status , Patient's Chart, lab work & pertinent test results  Airway Mallampati: II  TM Distance: >3 FB Neck ROM: Full    Dental no notable dental hx.    Pulmonary neg pulmonary ROS,    Pulmonary exam normal breath sounds clear to auscultation       Cardiovascular negative cardio ROS Normal cardiovascular exam Rhythm:Regular Rate:Normal     Neuro/Psych negative neurological ROS  negative psych ROS   GI/Hepatic negative GI ROS, Neg liver ROS,   Endo/Other  negative endocrine ROS  Renal/GU negative Renal ROS  negative genitourinary   Musculoskeletal negative musculoskeletal ROS (+)   Abdominal   Peds negative pediatric ROS (+)  Hematology negative hematology ROS (+)   Anesthesia Other Findings   Reproductive/Obstetrics (+) Pregnancy                             Anesthesia Physical Anesthesia Plan  ASA: II  Anesthesia Plan: Epidural   Post-op Pain Management:    Induction:   PONV Risk Score and Plan: Treatment may vary due to age or medical condition  Airway Management Planned: Natural Airway  Additional Equipment:   Intra-op Plan:   Post-operative Plan:   Informed Consent: I have reviewed the patients History and Physical, chart, labs and discussed the procedure including the risks, benefits and alternatives for the proposed anesthesia with the patient or authorized representative who has indicated his/her understanding and acceptance.       Plan Discussed with: Anesthesiologist  Anesthesia Plan Comments: (Patient identified. Risks, benefits, options discussed with patient including but not limited to bleeding, infection, nerve damage, paralysis, failed block, incomplete pain control, headache, blood pressure changes, nausea, vomiting, reactions to  medication, itching, and post partum back pain. Confirmed with bedside nurse the patient's most recent platelet count. Confirmed with the patient that they are not taking any anticoagulation, have any bleeding history or any family history of bleeding disorders. Patient expressed understanding and wishes to proceed. All questions were answered. )        Anesthesia Quick Evaluation  

## 2018-09-06 NOTE — Discharge Summary (Signed)
OB Discharge Summary     Patient Name: Stephanie Cabrera DOB: Oct 14, 2000 MRN: [id number]  Date of admission: 09/05/2018 Delivering MD: Arvilla Market   Date of discharge: 09/08/2018  Admitting diagnosis: pregnancy Intrauterine pregnancy: [redacted]w[redacted]d     Secondary diagnosis:  Active Problems:   Post term pregnancy at [redacted] weeks gestation   SVD (spontaneous vaginal delivery)  Additional problems: None     Discharge diagnosis: Term Pregnancy Delivered                                                                                                Post partum procedures:None  Augmentation: None  Complications: None  Hospital course:  Onset of Labor With Vaginal Delivery     18 y.o. yo G1P0 at [redacted]w[redacted]d was admitted in Latent Labor on 09/05/2018. Patient had an uncomplicated labor course as follows:  Membrane Rupture Time/Date: 5:59 AM ,09/06/2018   Intrapartum Procedures: Episiotomy: None [1]                                         Lacerations:  Sulcus [9];Labial [10]  Patient had a delivery of a Viable infant. 09/06/2018  Information for the patient's newborn:  [place], [place] [[id number]]  Delivery Method: Vag-Spont    Pateint had an uncomplicated postpartum course.  She is ambulating, tolerating a regular diet, passing flatus, and urinating well. Patient is discharged home in stable condition on 09/08/18.   Physical exam  Vitals:   09/07/18 0541 09/07/18 1449 09/07/18 2127 09/08/18 0759  BP: 108/64 132/75 132/88 116/74  Pulse: 73 75 80 70  Resp: 18 18 18 18   Temp: 98 F (36.7 C) 98.4 F (36.9 C) 98.5 F (36.9 C) 98.4 F (36.9 C)  TempSrc: Oral Oral Oral Oral  SpO2: 100% 100% 98%   Weight:      Height:       General: alert, cooperative and no distress Lochia: appropriate Uterine Fundus: firm Incision: N/A DVT Evaluation: No evidence of DVT seen on physical exam. No cords or calf tenderness. No significant calf/ankle edema. Labs: Lab Results  Component  Value Date   WBC 10.4 09/05/2018   HGB 13.4 09/05/2018   HCT 39.5 09/05/2018   MCV 85.5 09/05/2018   PLT 239 09/05/2018   No flowsheet data found.  Discharge instruction: per After Visit Summary and "Baby and Me Booklet".  After visit meds:  Allergies as of 09/08/2018   No Known Allergies     Medication List    TAKE these medications   acetaminophen 325 MG tablet Commonly known as:  Tylenol Take 2 tablets (650 mg total) by mouth every 4 (four) hours as needed (for pain scale < 4).   ibuprofen 600 MG tablet Commonly known as:  ADVIL Take 1 tablet (600 mg total) by mouth every 6 (six) hours.   loratadine 10 MG tablet Commonly known as:  CLARITIN Take 10 mg by mouth daily.   norethindrone 0.35 MG tablet Commonly known as:  MICRONOR Take  1 tablet (0.35 mg total) by mouth daily.   PNV PO Take by mouth.       Diet: routine diet  Activity: Advance as tolerated. Pelvic rest for 6 weeks.   Outpatient follow up:4 weeks Follow up Appt:No future appointments. Follow up Visit:No follow-ups on file.   Please schedule this patient for Postpartum visit in: 4 weeks with the following provider: Any provider For C/S patients schedule nurse incision check in weeks 2 weeks: no Low risk pregnancy complicated by: none  Delivery mode:  SVD Anticipated Birth Control:  POPs, considering Nexplanon at [facility] visit PP Procedures needed: None   Schedule Integrated BH visit: no  Postpartum contraception: Progesterone only pills  Newborn Data: Live born female  Birth Weight:   APGAR: 8, 9  Newborn Delivery   Birth date/time:  09/06/2018 09:45:00 Delivery type:  Vaginal, Spontaneous     Baby Feeding: Breast Disposition:home with mother   09/08/2018 Vonzella Nipple, PA-C

## 2018-09-06 NOTE — Anesthesia Procedure Notes (Signed)
Epidural Patient location during procedure: OB Start time: 09/06/2018 12:50 AM End time: 09/06/2018 1:05 AM  Staffing Anesthesiologist: Elmer Picker, MD Performed: anesthesiologist   Preanesthetic Checklist Completed: patient identified, pre-op evaluation, timeout performed, IV checked, risks and benefits discussed and monitors and equipment checked  Epidural Patient position: sitting Prep: site prepped and draped and DuraPrep Patient monitoring: continuous pulse ox, blood pressure, heart rate and cardiac monitor Approach: midline Location: L3-L4 Injection technique: LOR air  Needle:  Needle type: Tuohy  Needle gauge: 17 G Needle length: 9 cm Needle insertion depth: 7 cm Catheter type: closed end flexible Catheter size: 19 Gauge Catheter at skin depth: 13 cm Test dose: negative  Assessment Sensory level: T8 Events: blood not aspirated, injection not painful, no injection resistance, negative IV test and no paresthesia  Additional Notes Patient identified. Risks/Benefits/Options discussed with patient including but not limited to bleeding, infection, nerve damage, paralysis, failed block, incomplete pain control, headache, blood pressure changes, nausea, vomiting, reactions to medication both or allergic, itching and postpartum back pain. Confirmed with bedside nurse the patient's most recent platelet count. Confirmed with patient that they are not currently taking any anticoagulation, have any bleeding history or any family history of bleeding disorders. Patient expressed understanding and wished to proceed. All questions were answered. Sterile technique was used throughout the entire procedure. Please see nursing notes for vital signs. Test dose was given through epidural catheter and negative prior to continuing to dose epidural or start infusion. Warning signs of high block given to the patient including shortness of breath, tingling/numbness in hands, complete motor block,  or any concerning symptoms with instructions to call for help. Patient was given instructions on fall risk and not to get out of bed. All questions and concerns addressed with instructions to call with any issues or inadequate analgesia.  Reason for block:procedure for pain

## 2018-09-07 NOTE — Lactation Note (Signed)
This note was copied from a baby's chart. Lactation Consultation Note  Patient Name: Stephanie Cabrera Date: 09/07/2018  P1, 38 hour female infant, ETI and  weight loss -3 %. Mom was doing STS in chair with infant when [facility] entered the room. Mom unsure of voids and stools not been documenting them today. Mom feels breastfeeding is going well. LC reviewed importance of doc stools and voids for I & O or mom keep diapers she changed for record. Mom doesn't have support person rooming in with infant only. Per mom, infant recently finished breastfeeding for 45 minutes prior to [facility] entering room. LC did not observe latch at this time/  Per mom, infant has been cluster feeding today. Mom wanted review hand expression, mom easily expressed 9 ml of colostrum that was spoon feed to infant. Mom knows to breastfeed according hunger cues, 8 or 12 times within 24 hours and on demand due cluster feeding.   Maternal Data    Feeding Feeding Type: Breast Fed  LATCH Score Latch: Grasps breast easily, tongue down, lips flanged, rhythmical sucking.  Audible Swallowing: A few with stimulation  Type of Nipple: Everted at rest and after stimulation  Comfort (Breast/Nipple): Soft / non-tender  Hold (Positioning): Assistance needed to correctly position infant at breast and maintain latch.  LATCH Score: 8  Interventions Interventions: Assisted with latch  Lactation Tools Discussed/Used     Consult Status      Danelle Earthly 09/07/2018, 11:48 PM

## 2018-09-07 NOTE — Clinical Social Work Maternal (Signed)
CLINICAL SOCIAL WORK MATERNAL/CHILD NOTE  Patient Details  Name: Stephanie Cabrera MRN: [id number] Date of Birth: 11-22-2000  Date:  09/07/2018  Clinical Social Worker Initiating Note:  Hortencia Pilar, LCSWA  Date/Time: Initiated:  09/07/18/1000     Child's Name:  Smitty Pluck    Biological Parents:  Mother, Father(Envy Mae Cardy, Cora Daniels (FOB) )   Need for Interpreter:  None   Reason for Referral:  Current Substance Use/Substance Use During Pregnancy , Late or No Prenatal Care    Address:  [address] [place] [address]    Phone number:  [phone] (home)     Additional phone number: none   Household Members/Support Persons (HM/SP):   Household Member/Support Person 8, Household Member/Support Person 5   HM/SP Name Relationship DOB or Age  HM/SP -1   Chemika Vesely (MOB)   MOB   August 03, 2000  HM/SP -2   Cora Daniels (FOB)   FOB   19 years old   HM/SP -3   Takeisha Passenger transport manager (FOB's MOM)   FOB's Mom   unknown   HM/SP -4   Jasmine Banks (FOB's Sibling)   FOB's sibling   65 years old   HM/SP -5 LaShawn Carita Pian (FOB's sibling)  Fob's sibling  39 years old  HM/SP -6   Raquel James     19  HM/SP -7        HM/SP -8       Natural Supports (not living in the home):  Immediate Family, Extended Family, Investment banker, corporate Supports: None   Employment: Unemployed   Type of Work: Pension scheme manager    Education:  [employer] school graduate   Homebound arranged:  n/a   Surveyor, quantity Resources:    Media planner   Other Resources:  [facility]   Cultural/Religious Considerations Which May Impact Care:  none reported.  Strengths:  Ability to meet basic needs , Compliance with medical plan , Home prepared for child , Pediatrician chosen   Psychotropic Medications:    none      Pediatrician:    Fish farm manager area  Pediatrician List:   [facility] [phone])   [facility]      Pediatrician Fax Number:    Risk Factors/Current Problems:  None   Cognitive State:  Alert , Insightful    Mood/Affect:  Bright , Interested , Happy , Calm , Comfortable    CSW Assessment: CSW consulted as MOB had LPC. CSW went to speak with MOB at bedside to address further needs.   Upon entering the room CSW observed that MOB was sitting on the side of bed. CSW observed that MOB was on FaceTime with someone. CSW offered to return at another time however MOB expressed that CSW could stay. CSW congratulated MOB on the birth. CSW asked that MOB hang up phone with "sister" in order to keep MOB's information private. MOB expressed that she was okay with ending call with sister as he was heading to work anyway. CSW explained CSW's role here in the hospital as well as advised MOB of the reason for the visit. MOB expressed understanding of this.   Per MOB she didn't get care earlier on in her pregnancy. But started around 20 weeks. MOB expressed that she was seen multiple times after her intial visit. CSW advised MOB that CSW can only see that MOB has had 2  visits per chart. MOB reported that she went more than that. CSW understanding and advised MOB that from out records we only have access to two at this time. MOB understanding. CSW advised MOB of the hospital drug screen policy. CSW informed MOB that if infants UDS or CDS comes back positive for any substance that MOB was not prescribed while pregnant or given while here, then a CPS report would need to be made. CSW made aware by RN that previous cotton balls had been thrown away by MOB as they had poop on them. CSW explained to MOB that even if cotton balls contain poop, they can still be used and asked that she call RN to collect urin once infant has peed. MOB expressed that she would. CSW advised MOB that if cotton balls are thrown away by her this time then CSW is obligated to make CPS report as MOB is interfering with  medical procedures. MOB expressed understanding and that she would call RN to collect urine from infant. CSW understanding and agreeable at this time.   CSW spoke with MOB about her mental health history as well as her substance use history. MOB denies ever being diagnosed with any mental health diagnoses and denies ever using any substances. CSW notified that MOB is from home with FOB as well as his family. MOB expressed that she has support from her family as well as FOB's family. MOB reported that she is currently on maternity leave for infant. MOB reported that she works at [employer].   CSW reviewed with MOB PPD as well as SIDS. MOB expressed that she and her sister have conversation a lot about PPD as well as SIDS. CSW provided MOB with Postpartum Progress Checklist for MOB to keep track of feelings. MOB expressed no further concerns.    CSW will continue to monitor UDS and CDS for needed CPS report.   CSW Plan/Description:  No Further Intervention Required/No Barriers to Discharge, CSW Will Continue to Monitor Umbilical Cord Tissue Drug Screen Results and Make Report if [facility], Hospital Drug Screen Policy Information, Other Patient/Family Education, Sudden Infant Death Syndrome (SIDS) Education    Robb MatarKierra S Abra Lingenfelter, LCSWA 09/07/2018, 10:35 AM

## 2018-09-07 NOTE — Anesthesia Postprocedure Evaluation (Signed)
Anesthesia Post Note  Patient: Stephanie Cabrera  Procedure(s) Performed: AN AD HOC LABOR EPIDURAL     Patient location during evaluation: Mother Baby Anesthesia Type: Epidural Level of consciousness: awake and alert Pain management: pain level controlled Vital Signs Assessment: post-procedure vital signs reviewed and stable Respiratory status: spontaneous breathing, nonlabored ventilation and respiratory function stable Cardiovascular status: stable Postop Assessment: no headache, no backache and epidural receding Anesthetic complications: no    Last Vitals:  Vitals:   09/07/18 0132 09/07/18 0541  BP: 123/73 108/64  Pulse: 86 73  Resp: 18 18  Temp: 36.9 C 36.7 C  SpO2: 100% 100%    Last Pain:  Vitals:   09/07/18 1300  TempSrc:   PainSc: 0-No pain   Pain Goal: Patients Stated Pain Goal: 3 (09/05/18 2339)                 Fanny Dance

## 2018-09-07 NOTE — Progress Notes (Signed)
Post Partum Day 1 Subjective: Doing well, breastfeeding going well. Pain fairly well-controlled.   Objective: Blood pressure 132/75, pulse 75, temperature 98.4 F (36.9 C), temperature source Oral, resp. rate 18, height 5\' 7"  (1.702 m), weight 95.7 kg, last menstrual period 01/01/2018, SpO2 100 %, unknown if currently breastfeeding.  Physical Exam:  General: alert, well-appearing, NAD Lochia: appropriate Uterine Fundus: firm Incision: n/a DVT Evaluation: No significant calf/ankle edema.  Recent Labs    09/05/18 2240  HGB 13.4  HCT 39.5    Assessment/Plan: Plan for discharge tomorrow Routine postpartum care   LOS: 2 days   Tamera Stands, DO 09/07/2018, 8:53 PM

## 2018-09-08 ENCOUNTER — Inpatient Hospital Stay (HOSPITAL_COMMUNITY): Payer: Managed Care, Other (non HMO)

## 2018-09-08 MED ORDER — ACETAMINOPHEN 325 MG PO TABS: 650.0000 mg | ORAL_TABLET | ORAL | 0 refills | Status: DC | PRN

## 2018-09-08 MED ORDER — NORETHINDRONE 0.35 MG PO TABS: 1.0000 | ORAL_TABLET | Freq: Every day | ORAL | 11 refills | Status: DC

## 2018-09-08 MED ORDER — IBUPROFEN 600 MG PO TABS: 600.0000 mg | ORAL_TABLET | Freq: Four times a day (QID) | ORAL | 0 refills | Status: DC

## 2018-09-08 NOTE — Discharge Instructions (Signed)
Vaginal Delivery, Care After °Refer to this sheet in the next few weeks. These instructions provide you with information about caring for yourself after vaginal delivery. Your health care provider may also give you more specific instructions. Your treatment has been planned according to current medical practices, but problems sometimes occur. Call your health care provider if you have any problems or questions. °What can I expect after the procedure? °After vaginal delivery, it is common to have: °· Some bleeding from your vagina. °· Soreness in your abdomen, your vagina, and the area of skin between your vaginal opening and your anus (perineum). °· Pelvic cramps. °· Fatigue. °Follow these instructions at home: °Medicines °· Take over-the-counter and prescription medicines only as told by your health care provider. °· If you were prescribed an antibiotic medicine, take it as told by your health care provider. Do not stop taking the antibiotic until it is finished. °Driving ° °· Do not drive or operate heavy machinery while taking prescription pain medicine. °· Do not drive for 24 hours if you received a sedative. °Lifestyle °· Do not drink alcohol. This is especially important if you are breastfeeding or taking medicine to relieve pain. °· Do not use tobacco products, including cigarettes, chewing tobacco, or e-cigarettes. If you need help quitting, ask your health care provider. °Eating and drinking °· Drink at least 8 eight-ounce glasses of water every day unless you are told not to by your health care provider. If you choose to breastfeed your baby, you may need to drink more water than this. °· Eat high-fiber foods every day. These foods may help prevent or relieve constipation. High-fiber foods include: °? Whole grain cereals and breads. °? Brown rice. °? Beans. °? Fresh fruits and vegetables. °Activity °· Return to your normal activities as told by your health care provider. Ask your health care provider what  activities are safe for you. °· Rest as much as possible. Try to rest or take a nap when your baby is sleeping. °· Do not lift anything that is heavier than your baby or 10 lb (4.5 kg) until your health care provider says that it is safe. °· Talk with your health care provider about when you can engage in sexual activity. This may depend on your: °? Risk of infection. °? Rate of healing. °? Comfort and desire to engage in sexual activity. °Vaginal Care °· If you have an episiotomy or a vaginal tear, check the area every day for signs of infection. Check for: °? More redness, swelling, or pain. °? More fluid or blood. °? Warmth. °? Pus or a bad smell. °· Do not use tampons or douches until your health care provider says this is safe. °· Watch for any blood clots that may pass from your vagina. These may look like clumps of dark red, brown, or black discharge. °General instructions °· Keep your perineum clean and dry as told by your health care provider. °· Wear loose, comfortable clothing. °· Wipe from front to back when you use the toilet. °· Ask your health care provider if you can shower or take a bath. If you had an episiotomy or a perineal tear during labor and delivery, your health care provider may tell you not to take baths for a certain length of time. °· Wear a bra that supports your breasts and fits you well. °· If possible, have someone help you with household activities and help care for your baby for at least a few days after you   leave the hospital. °· Keep all follow-up visits for you and your baby as told by your health care provider. This is important. °Contact a health care provider if: °· You have: °? Vaginal discharge that has a bad smell. °? Difficulty urinating. °? Pain when urinating. °? A sudden increase or decrease in the frequency of your bowel movements. °? More redness, swelling, or pain around your episiotomy or vaginal tear. °? More fluid or blood coming from your episiotomy or vaginal  tear. °? Pus or a bad smell coming from your episiotomy or vaginal tear. °? A fever. °? A rash. °? Little or no interest in activities you used to enjoy. °? Questions about caring for yourself or your baby. °· Your episiotomy or vaginal tear feels warm to the touch. °· Your episiotomy or vaginal tear is separating or does not appear to be healing. °· Your breasts are painful, hard, or turn red. °· You feel unusually sad or worried. °· You feel nauseous or you vomit. °· You pass large blood clots from your vagina. If you pass a blood clot from your vagina, save it to show to your health care provider. Do not flush blood clots down the toilet without having your health care provider look at them. °· You urinate more than usual. °· You are dizzy or light-headed. °· You have not breastfed at all and you have not had a menstrual period for 12 weeks after delivery. °· You have stopped breastfeeding and you have not had a menstrual period for 12 weeks after you stopped breastfeeding. °Get help right away if: °· You have: °? Pain that does not go away or does not get better with medicine. °? Chest pain. °? Difficulty breathing. °? Blurred vision or spots in your vision. °? Thoughts about hurting yourself or your baby. °· You develop pain in your abdomen or in one of your legs. °· You develop a severe headache. °· You faint. °· You bleed from your vagina so much that you fill two sanitary pads in one hour. °This information is not intended to replace advice given to you by your health care provider. Make sure you discuss any questions you have with your health care provider. °Document Released: 03/22/2000 Document Revised: 09/06/2015 Document Reviewed: 04/09/2015 °Elsevier Interactive Patient Education © 2019 Elsevier Inc. ° °

## 2018-09-08 NOTE — Lactation Note (Signed)
This note was copied from a baby's chart. Lactation Consultation Note  Patient Name: Stephanie Cabrera EWYBR'K Date: 09/08/2018 Reason for consult: Follow-up assessment;Primapara;Term Baby is 47 hours old/6% weight loss.  Mom states breastfeeding is going well.  She is currently feeding baby in cross cradle hold.  Baby is active with swallows noted.  Discussed milk coming to volume and the prevention and treatment of engorgement.  Mom has a DEBP at home.  She denies questions or concerns.  Lactation outpatient services reviewed and encouraged prn.  Maternal Data    Feeding    LATCH Score                   Interventions    Lactation Tools Discussed/Used     Consult Status Consult Status: Complete Follow-up type: Call as needed    Huston Foley 09/08/2018, 9:28 AM

## 2018-10-12 ENCOUNTER — Other Ambulatory Visit: Payer: Self-pay

## 2018-10-12 ENCOUNTER — Telehealth: Payer: Self-pay | Admitting: Advanced Practice Midwife

## 2018-10-12 ENCOUNTER — Telehealth: Payer: Managed Care, Other (non HMO) | Admitting: Advanced Practice Midwife

## 2018-10-12 ENCOUNTER — Telehealth: Payer: Self-pay | Admitting: Licensed Clinical Social Worker

## 2018-10-12 DIAGNOSIS — Z91199 Patient's noncompliance with other medical treatment and regimen due to unspecified reason: Secondary | ICD-10-CM

## 2018-10-12 DIAGNOSIS — Z5329 Procedure and treatment not carried out because of patient's decision for other reasons: Secondary | ICD-10-CM

## 2018-10-12 NOTE — Telephone Encounter (Signed)
Spoke with patient about her missed postpartum visit on 7/6 and to get her rescheduled. Patient was rescheduled for 7/7 @ 3:15. Patient downloaded the app while we were on the phone. Patient was instructed that the visit is a mychart visit.

## 2018-10-12 NOTE — Progress Notes (Signed)
@  848am no answer lvm  @855am  called [phone] number but dad answered and said to call the [phone] number again called no answer.

## 2018-10-12 NOTE — Progress Notes (Signed)
Patient did not answer for her visit.   Marcille Buffy DNP, CNM  10/12/18  9:00 AM

## 2018-10-12 NOTE — Telephone Encounter (Signed)
Left message for callback regarding rescheduling missed appt

## 2018-10-13 ENCOUNTER — Other Ambulatory Visit: Payer: Self-pay

## 2018-10-13 ENCOUNTER — Telehealth (INDEPENDENT_AMBULATORY_CARE_PROVIDER_SITE_OTHER): Payer: Managed Care, Other (non HMO)

## 2018-10-13 NOTE — Progress Notes (Signed)
I connected with  Stephanie Cabrera on 10/13/18 at  3:15 PM EDT by telephone and verified that I am speaking with the correct person using two identifiers.   I discussed the limitations, risks, security and privacy concerns of performing an evaluation and management service by telephone and the availability of in person appointments. I also discussed with the patient that there may be a patient responsible charge related to this service. The patient expressed understanding and agreed to proceed.  Alric Seton, [place] 10/13/2018  3:19 PM     Stephanie Cabrera is a 18 y.o. female who presents for a postpartum visit. She is 8 weeks postpartum following a spontaneous vaginal delivery. I have fully reviewed the prenatal and intrapartum course. The delivery was at [redacted]w[redacted]d gestational weeks. Outcome: spontaneous vaginal delivery. Anesthesia: epidural. Postpartum course has been unremarkable. Baby's course has been unremarkable. Baby is feeding by both breast and bottle - Similac with Iron. Bleeding no bleeding. Bowel function is normal. Bladder function is normal. Patient is not sexually active. Contraception method is none. Postpartum depression screening: negative.

## 2018-10-13 NOTE — Progress Notes (Signed)
TELEHEALTH VIRTUAL POSTPARTUM VISIT ENCOUNTER NOTE  I connected with@ on 10/13/18 at  3:15 PM EDT by WebEx at home and verified that I am speaking with the correct person using two identifiers.   I discussed the limitations, risks, security and privacy concerns of performing an evaluation and management service by telephone and the availability of in person appointments. I also discussed with the patient that there may be a patient responsible charge related to this service. The patient expressed understanding and agreed to proceed.  Appointment Date: 10/13/2018  OBGYN Clinic: [facility]  Chief Complaint:  Postpartum Visit  History of Present Illness: Stephanie Cabrera is a 18 y.o. African-American G1P1001 (No LMP recorded.), seen for the above chief complaint. Her past medical history is significant for n/a   She is s/p normal spontaneous vaginal delivery on 5/31 at 41 weeks; she was discharged to home on PPD#2. Pregnancy complicated by n/a. Baby is doing well.  Complains of n/a  Vaginal bleeding or discharge: No  Mode of feeding infant: Both Intercourse: No  Contraception: no method, considering options PP depression s/s: No .  Any bowel or bladder issues: No  Pap smear: n/a   Review of Systems: Positive for n/a. Her 12 point review of systems is negative or as noted in the History of Present Illness.  There are no active problems to display for this patient.   Medications Enrika Kattner had no medications administered during this visit. Current Outpatient Medications  Medication Sig Dispense Refill  . acetaminophen (TYLENOL) 325 MG tablet Take 2 tablets (650 mg total) by mouth every 4 (four) hours as needed (for pain scale < 4). 30 tablet 0  . ibuprofen (ADVIL) 600 MG tablet Take 1 tablet (600 mg total) by mouth every 6 (six) hours. 30 tablet 0  . loratadine (CLARITIN) 10 MG tablet Take 10 mg by mouth daily.    . norethindrone (MICRONOR) 0.35 MG tablet Take 1 tablet (0.35 mg  total) by mouth daily. 1 Package 11  . Prenatal Vit w/Fe-Methylfol-FA (PNV PO) Take by mouth.     No current facility-administered medications for this visit.     Allergies Patient has no known allergies.  Physical Exam:  General:  Alert, oriented and cooperative.   Mental Status: Normal mood and affect perceived. Normal judgment and thought content.  Rest of physical exam deferred due to type of encounter  PP Depression Screening:   Edinburgh Postnatal Depression Scale - 10/13/18 1522      Edinburgh Postnatal Depression Scale:  In the Past 7 Days   I have been able to laugh and see the funny side of things.  0    I have looked forward with enjoyment to things.  0    I have blamed myself unnecessarily when things went wrong.  0    I have been anxious or worried for no good reason.  0    I have felt scared or panicky for no good reason.  0    Things have been getting on top of me.  0    I have been so unhappy that I have had difficulty sleeping.  0    I have felt sad or miserable.  0    I have been so unhappy that I have been crying.  0    The thought of harming myself has occurred to me.  0    Edinburgh Postnatal Depression Scale Total  0       Assessment:Patient is a  18 y.o. G1P1001 who is 6 weeks postpartum from a normal spontaneous vaginal delivery.  She is doing well.   Plan:  1. Postpartum care and examination -Patient doing well postpartum -Considering options for birth control, trying to decide between Nexplanon and Depo. Discussed at length and encouraged patient to call office when she decides. Discussed barrier methods in the meantime.    RTC 1 year or sooner if needed  I discussed the assessment and treatment plan with the patient. The patient was provided an opportunity to ask questions and all were answered. The patient agreed with the plan and demonstrated an understanding of the instructions.   The patient was advised to call back or seek an in-person  evaluation/go to the ED for any concerning postpartum symptoms.  I provided 10 minutes of non-face-to-face time during this encounter.   Wende Mott, [place] for [employer], [place]

## 2019-04-09 DIAGNOSIS — G35 Multiple sclerosis: Secondary | ICD-10-CM

## 2019-04-09 HISTORY — DX: Multiple sclerosis: G35

## 2019-08-11 DIAGNOSIS — Z20822 Contact with and (suspected) exposure to covid-19: Secondary | ICD-10-CM | POA: Diagnosis not present

## 2019-08-27 DIAGNOSIS — R634 Abnormal weight loss: Secondary | ICD-10-CM | POA: Diagnosis not present

## 2019-08-27 DIAGNOSIS — Z20822 Contact with and (suspected) exposure to covid-19: Secondary | ICD-10-CM | POA: Diagnosis not present

## 2019-08-27 DIAGNOSIS — Z6821 Body mass index (BMI) 21.0-21.9, adult: Secondary | ICD-10-CM | POA: Diagnosis not present

## 2019-09-02 ENCOUNTER — Encounter (HOSPITAL_COMMUNITY): Payer: Self-pay | Admitting: *Deleted

## 2019-09-02 ENCOUNTER — Other Ambulatory Visit: Payer: Self-pay

## 2019-09-02 ENCOUNTER — Emergency Department (HOSPITAL_COMMUNITY)
Admission: EM | Admit: 2019-09-02 | Discharge: 2019-09-02 | Disposition: A | Discharge status: Home / Self Care | Payer: Medicaid Other | Attending: Emergency Medicine | Admitting: Emergency Medicine

## 2019-09-02 DIAGNOSIS — R4020 Unspecified coma: Secondary | ICD-10-CM | POA: Diagnosis not present

## 2019-09-02 DIAGNOSIS — R4182 Altered mental status, unspecified: Secondary | ICD-10-CM | POA: Diagnosis not present

## 2019-09-02 DIAGNOSIS — Z5321 Procedure and treatment not carried out due to patient leaving prior to being seen by health care provider: Secondary | ICD-10-CM | POA: Diagnosis not present

## 2019-09-02 DIAGNOSIS — R634 Abnormal weight loss: Secondary | ICD-10-CM | POA: Diagnosis not present

## 2019-09-02 DIAGNOSIS — T50904A Poisoning by unspecified drugs, medicaments and biological substances, undetermined, initial encounter: Secondary | ICD-10-CM | POA: Diagnosis not present

## 2019-09-02 DIAGNOSIS — T887XXA Unspecified adverse effect of drug or medicament, initial encounter: Secondary | ICD-10-CM | POA: Diagnosis not present

## 2019-09-02 DIAGNOSIS — R531 Weakness: Secondary | ICD-10-CM | POA: Diagnosis not present

## 2019-09-02 NOTE — ED Triage Notes (Signed)
BIB EMS  Due to taking 4 3mg  Melatonin due to not being able to sleep about 0720 to sleep. Upon Fire arrival pt unresponsive although responsive when EMS arrived. Pt denies taking anything else. Continues to feel sleepy. #18 L AC

## 2019-09-02 NOTE — ED Notes (Signed)
Pt walked out, as leaving gave labels to the screener.

## 2019-09-15 ENCOUNTER — Ambulatory Visit (INDEPENDENT_AMBULATORY_CARE_PROVIDER_SITE_OTHER): Payer: BC Managed Care – PPO | Admitting: Family Medicine

## 2019-09-15 ENCOUNTER — Other Ambulatory Visit: Payer: Self-pay

## 2019-09-15 ENCOUNTER — Encounter: Payer: Self-pay | Admitting: Family Medicine

## 2019-09-15 VITALS — BP 116/68 | HR 85 | Temp 97.5°F | Resp 12 | Ht 66.22 in | Wt 149.2 lb

## 2019-09-15 DIAGNOSIS — R569 Unspecified convulsions: Secondary | ICD-10-CM

## 2019-09-15 DIAGNOSIS — F419 Anxiety disorder, unspecified: Secondary | ICD-10-CM

## 2019-09-15 NOTE — Progress Notes (Signed)
HPI:  Stephanie Cabrera is a 19 y.o. female, who is here today with her mother to establish care.  Former PCP: Dr Jake Michaelis Last preventive routine visit: 12/2010. She follows with gynecologist regularly.  She lives with her mother and her daughter (new born). Exercises regularly.  Chronic medical problems: Otherwise healthy until recently when according to her mother,she had a seizure episode. On 09/02/19 around 8 Am, she had an episode of syncope and her mother witnesses generalized shaking like movement. + Tongue bitten. No associated urinary or bowel incontinence. Episode lasted about 3 minutes. She woke up when she was in ambulance , felt "sluggish." She was oriented. She left ER before she was seen. No prior Hx of seizures or head trauma. She did not eta breakfast but had dinner the night before. Her mother states that she took sleep aid because she could not sleep,took more than recommended (melatonin gummies).  Mild bitemporal headache before episode. Denies visual changes,CP,palpitations,SOB,abdominal pain,nausea,or vomiting.  Her mother thinks stress played a role. She has had some anxiety, she states that she "always" has had anxiety. She has not taken medications.  She feels like problem has been worse after delivery, 09/06/19. Denies suicidal thoughts. Depression screen PHQ 2/9 09/15/2019  Decreased Interest 0  Down, Depressed, Hopeless 2  PHQ - 2 Score 2  Altered sleeping 2  Tired, decreased energy 1  Change in appetite 3  Feeling bad or failure about yourself  0  Trouble concentrating 0  Moving slowly or fidgety/restless 0  Suicidal thoughts 0  PHQ-9 Score 8  Difficult doing work/chores Somewhat difficult   GAD 7 : Generalized Anxiety Score 09/15/2019  Nervous, Anxious, on Edge 1  Control/stop worrying 1  Worry too much - different things 1  Trouble relaxing 0  Restless 1  Easily annoyed or irritable 1  Afraid - awful might happen 1  Total GAD 7  Score 6    According to her mother, she had blood work elsewhere a few days ago.  Review of Systems  Constitutional: Positive for appetite change and fatigue. Negative for activity change and fever.  HENT: Negative for mouth sores, nosebleeds and sore throat.   Eyes: Negative for redness and visual disturbance.  Respiratory: Negative for cough and wheezing.   Cardiovascular: Negative for leg swelling.  Gastrointestinal:       Negative for changes in bowel habits.  Genitourinary: Negative for decreased urine volume, dysuria and hematuria.  Neurological: Negative for facial asymmetry, speech difficulty and weakness.  Psychiatric/Behavioral: Negative for confusion and hallucinations.  Rest see pertinent positives and negatives per HPI.  Current Outpatient Medications on File Prior to Visit  Medication Sig Dispense Refill  . acetaminophen (TYLENOL) 325 MG tablet Take 2 tablets (650 mg total) by mouth every 4 (four) hours as needed (for pain scale < 4). 30 tablet 0   No current facility-administered medications on file prior to visit.   Past Medical History:  Diagnosis Date  . Allergy   . Medical history non-contributory    No Known Allergies  Family History  Problem Relation Age of Onset  . Diabetes Paternal Grandmother   . Diabetes Paternal Grandfather   . Hypertension Paternal Grandfather     Social History   Socioeconomic History  . Marital status: Single    Spouse name: Not on file  . Number of children: Not on file  . Years of education: Not on file  . Highest education level: Not on file  Occupational  History  . Not on file  Tobacco Use  . Smoking status: Never Smoker  . Smokeless tobacco: Never Used  Substance and Sexual Activity  . Alcohol use: Never    Alcohol/week: 0.0 standard drinks  . Drug use: Never  . Sexual activity: Not Currently    Birth control/protection: None  Other Topics Concern  . Not on file  Social History Narrative  . Not on file    Social Determinants of Health   Financial Resource Strain:   . Difficulty of Paying Living Expenses:   Food Insecurity:   . Worried About Programme researcher, broadcasting/film/video in the Last Year:   . Barista in the Last Year:   Transportation Needs:   . Freight forwarder (Medical):   Marland Kitchen Lack of Transportation (Non-Medical):   Physical Activity:   . Days of Exercise per Week:   . Minutes of Exercise per Session:   Stress:   . Feeling of Stress :   Social Connections:   . Frequency of Communication with Friends and Family:   . Frequency of Social Gatherings with Friends and Family:   . Attends Religious Services:   . Active Member of Clubs or Organizations:   . Attends Banker Meetings:   Marland Kitchen Marital Status:     Vitals:   09/15/19 1436  BP: 116/68  Pulse: 85  Resp: 12  Temp: (!) 97.5 F (36.4 C)  SpO2: 98%   Body mass index is 23.93 kg/m.  Physical Exam  Nursing note and vitals reviewed. Constitutional: She is oriented to person, place, and time. She appears well-developed and well-nourished. No distress.  HENT:  Head: Normocephalic and atraumatic.  Mouth/Throat: Oropharynx is clear and moist and mucous membranes are normal.  Eyes: Pupils are equal, round, and reactive to light. Conjunctivae are normal.  Cardiovascular: Normal rate and regular rhythm.  No murmur heard. Pulses:      Dorsalis pedis pulses are 2+ on the right side and 2+ on the left side.  Respiratory: Effort normal and breath sounds normal. No respiratory distress.  GI: Soft. She exhibits no mass. There is no hepatomegaly. There is no abdominal tenderness.  Musculoskeletal:        General: No edema.  Lymphadenopathy:    She has no cervical adenopathy.  Neurological: She is alert and oriented to person, place, and time. She has normal strength. No cranial nerve deficit. Gait normal.  Reflex Scores:      Bicep reflexes are 2+ on the right side and 2+ on the left side.      Patellar reflexes  are 2+ on the right side and 2+ on the left side. Skin: Skin is warm. No rash noted. No erythema.  Psychiatric: She has a normal mood and affect.  Well groomed, good eye contact.   ASSESSMENT AND PLAN:  Kariana was seen today for establish care.  Diagnoses and all orders for this visit:  Seizure-like activity [facility]) We discussed possible etiologies, including psychiatric conditions. Explained that current recommendations is not to drive for 6 months after seizure. We will try to obtain copy of lab results done recently. Instructed about warning signs.  -     Ambulatory referral to Neurology  Anxiety disorder, unspecified type For now I am not recommending pharmacologic treatment. Information about CBT given,so she can arranged appt. She and her mother agree with plan.  Return in about 2 months (around 11/15/2019).   Tyniah Kastens G. [place], MD  [facility]  [place] office.

## 2019-09-15 NOTE — Patient Instructions (Addendum)
A few things to remember from today's visit:   Seizure-like activity [facility]) - Plan: Ambulatory referral to Neurology  For now I recommend no driving for 6 months (after episode). Appointment with neuro will be arranged.  If you need refills please call your pharmacy. Do not use My Chart to request refills or for acute issues that need immediate attention.    Please be sure medication list is accurate. If a new problem present, please set up appointment sooner than planned today.

## 2019-09-16 ENCOUNTER — Encounter: Payer: Self-pay | Admitting: Neurology

## 2019-09-23 ENCOUNTER — Emergency Department (HOSPITAL_COMMUNITY): Payer: BC Managed Care – PPO

## 2019-09-23 ENCOUNTER — Encounter (HOSPITAL_COMMUNITY): Payer: Self-pay | Admitting: Radiology

## 2019-09-23 ENCOUNTER — Emergency Department (HOSPITAL_COMMUNITY)
Admission: EM | Admit: 2019-09-23 | Discharge: 2019-09-24 | Disposition: A | Discharge status: Home / Self Care | Payer: BC Managed Care – PPO | Source: Home / Self Care | Attending: Emergency Medicine | Admitting: Emergency Medicine

## 2019-09-23 ENCOUNTER — Other Ambulatory Visit: Payer: Self-pay

## 2019-09-23 DIAGNOSIS — Z20822 Contact with and (suspected) exposure to covid-19: Secondary | ICD-10-CM | POA: Insufficient documentation

## 2019-09-23 DIAGNOSIS — Z560 Unemployment, unspecified: Secondary | ICD-10-CM | POA: Diagnosis not present

## 2019-09-23 DIAGNOSIS — Z833 Family history of diabetes mellitus: Secondary | ICD-10-CM | POA: Diagnosis not present

## 2019-09-23 DIAGNOSIS — R440 Auditory hallucinations: Secondary | ICD-10-CM

## 2019-09-23 DIAGNOSIS — Z8249 Family history of ischemic heart disease and other diseases of the circulatory system: Secondary | ICD-10-CM | POA: Diagnosis not present

## 2019-09-23 DIAGNOSIS — F919 Conduct disorder, unspecified: Secondary | ICD-10-CM | POA: Insufficient documentation

## 2019-09-23 DIAGNOSIS — R9431 Abnormal electrocardiogram [ECG] [EKG]: Secondary | ICD-10-CM | POA: Diagnosis not present

## 2019-09-23 DIAGNOSIS — R4689 Other symptoms and signs involving appearance and behavior: Secondary | ICD-10-CM | POA: Diagnosis not present

## 2019-09-23 DIAGNOSIS — Z915 Personal history of self-harm: Secondary | ICD-10-CM | POA: Diagnosis not present

## 2019-09-23 DIAGNOSIS — R4182 Altered mental status, unspecified: Secondary | ICD-10-CM | POA: Diagnosis not present

## 2019-09-23 DIAGNOSIS — F23 Brief psychotic disorder: Secondary | ICD-10-CM | POA: Diagnosis not present

## 2019-09-23 DIAGNOSIS — Z03818 Encounter for observation for suspected exposure to other biological agents ruled out: Secondary | ICD-10-CM | POA: Diagnosis not present

## 2019-09-23 LAB — RAPID URINE DRUG SCREEN, HOSP PERFORMED
Amphetamines: NOT DETECTED
Barbiturates: NOT DETECTED
Benzodiazepines: NOT DETECTED
Cocaine: NOT DETECTED
Opiates: NOT DETECTED
Tetrahydrocannabinol: NOT DETECTED

## 2019-09-23 LAB — CBC WITH DIFFERENTIAL/PLATELET
Abs Immature Granulocytes: 0.01 10*3/uL (ref 0.00–0.07)
Basophils Absolute: 0 10*3/uL (ref 0.0–0.1)
Basophils Relative: 1 %
Eosinophils Absolute: 0.1 10*3/uL (ref 0.0–0.5)
Eosinophils Relative: 1 %
HCT: 38.1 % (ref 36.0–46.0)
Hemoglobin: 12.5 g/dL (ref 12.0–15.0)
Immature Granulocytes: 0 %
Lymphocytes Relative: 33 %
Lymphs Abs: 2 10*3/uL (ref 0.7–4.0)
MCH: 29.1 pg (ref 26.0–34.0)
MCHC: 32.8 g/dL (ref 30.0–36.0)
MCV: 88.8 fL (ref 80.0–100.0)
Monocytes Absolute: 0.5 10*3/uL (ref 0.1–1.0)
Monocytes Relative: 9 %
Neutro Abs: 3.5 10*3/uL (ref 1.7–7.7)
Neutrophils Relative %: 56 %
Platelets: 284 10*3/uL (ref 150–400)
RBC: 4.29 MIL/uL (ref 3.87–5.11)
RDW: 13.2 % (ref 11.5–15.5)
WBC: 6.1 10*3/uL (ref 4.0–10.5)
nRBC: 0 % (ref 0.0–0.2)

## 2019-09-23 LAB — I-STAT BETA HCG BLOOD, ED (MC, WL, AP ONLY): I-stat hCG, quantitative: <5 m[IU]/mL (ref <5)

## 2019-09-23 LAB — COMPREHENSIVE METABOLIC PANEL
ALT: 23 U/L (ref 0–44)
AST: 19 U/L (ref 15–41)
Albumin: 3.4 g/dL — ABNORMAL LOW (ref 3.5–5.0)
Alkaline Phosphatase: 65 U/L (ref 38–126)
Anion gap: 8 (ref 5–15)
BUN: 14 mg/dL (ref 6–20)
CO2: 25 mmol/L (ref 22–32)
Calcium: 8.5 mg/dL — ABNORMAL LOW (ref 8.9–10.3)
Chloride: 104 mmol/L (ref 98–111)
Creatinine, Ser: 0.64 mg/dL (ref 0.44–1.00)
GFR calc Af Amer: >60 mL/min (ref >60)
GFR calc non Af Amer: >60 mL/min (ref >60)
Glucose, Bld: 103 mg/dL — ABNORMAL HIGH (ref 70–99)
Potassium: 4.3 mmol/L (ref 3.5–5.1)
Sodium: 137 mmol/L (ref 135–145)
Total Bilirubin: 0.6 mg/dL (ref 0.3–1.2)
Total Protein: 7.1 g/dL (ref 6.5–8.1)

## 2019-09-23 LAB — SARS CORONAVIRUS 2 BY RT PCR (HOSPITAL ORDER, PERFORMED IN ~~LOC~~ HOSPITAL LAB): SARS Coronavirus 2: NEGATIVE

## 2019-09-23 LAB — ETHANOL: Alcohol, Ethyl (B): <10 mg/dL (ref <10)

## 2019-09-23 LAB — TSH: TSH: 1.479 u[IU]/mL (ref 0.350–4.500)

## 2019-09-23 IMAGING — CT CT HEAD W/O CM
3 series · 16 of 46 positions shown, 19 images · non-contrast
Comparison: None.

CLINICAL DATA: Altered behavior for approximately 1 year since
giving birth.

EXAM:
CT HEAD WITHOUT CONTRAST
TECHNIQUE: Contiguous axial images were obtained from the base of the skull
through the vertex without intravenous contrast.

[Series 2: head wo · axial · 0.47mm/px · z∈[-150,-30]mm · 10 of 29 slices shown, 13 images]
[im 3/29  brain]
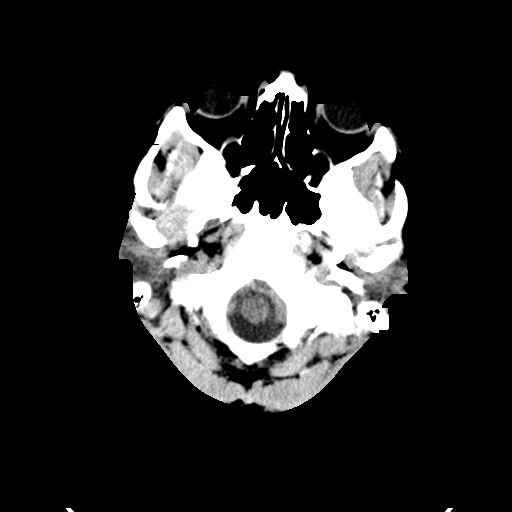
[im 3/29  bone]
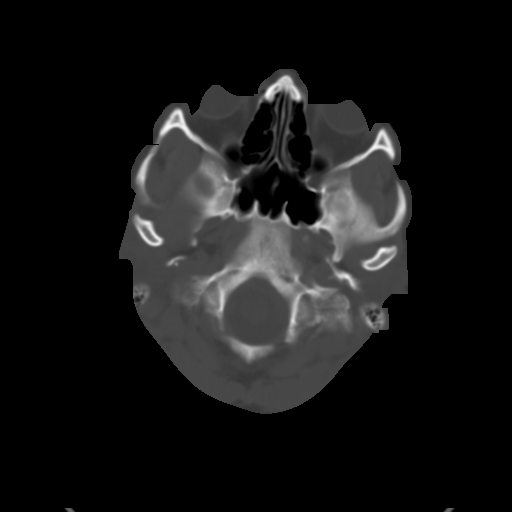
[im 6/29  brain]
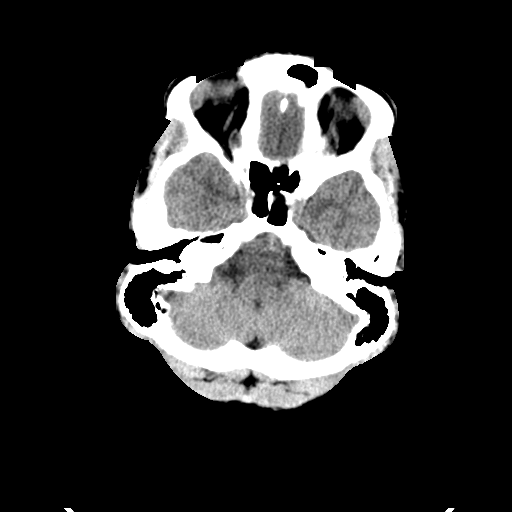
[im 8/29  brain]
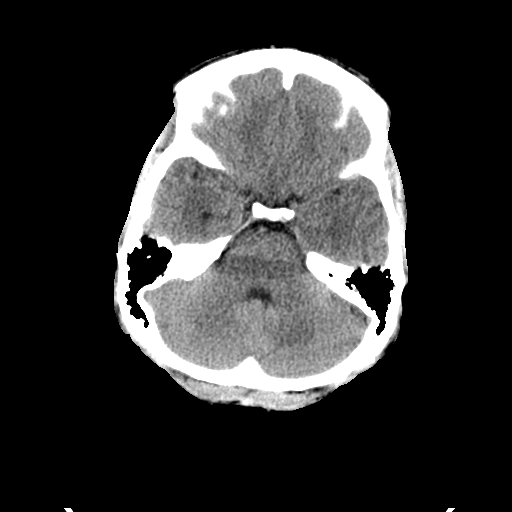
[im 11/29  brain]
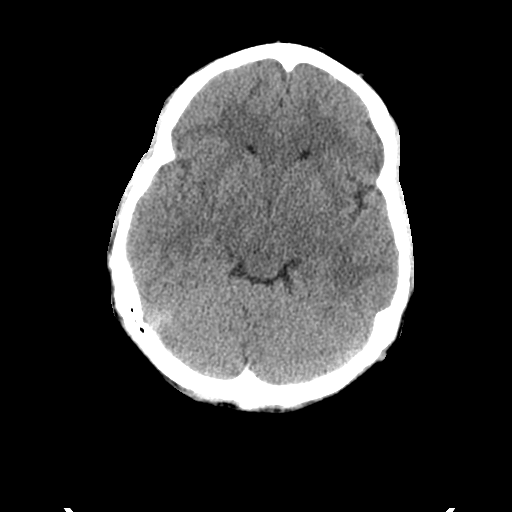
[im 14/29  brain]
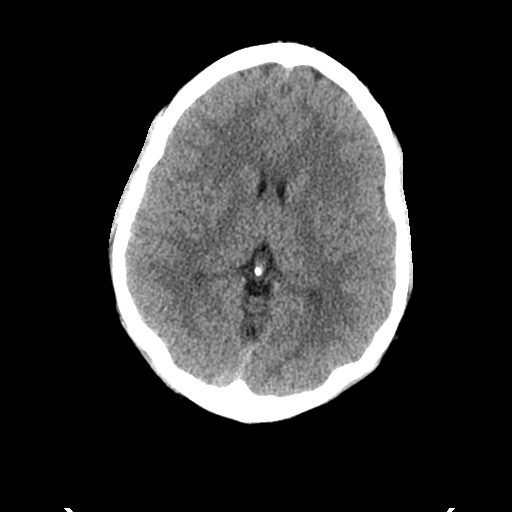
[im 14/29  bone]
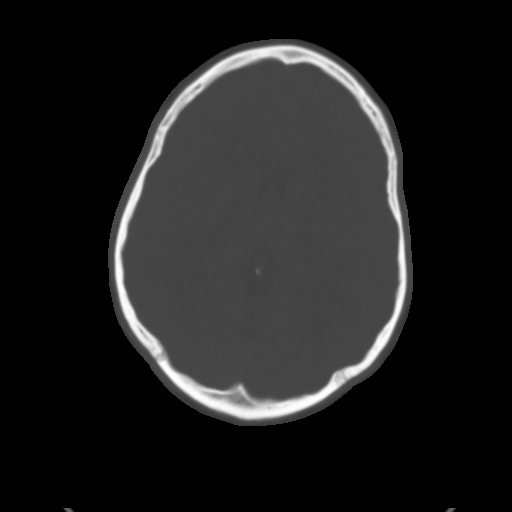
[im 16/29  brain]
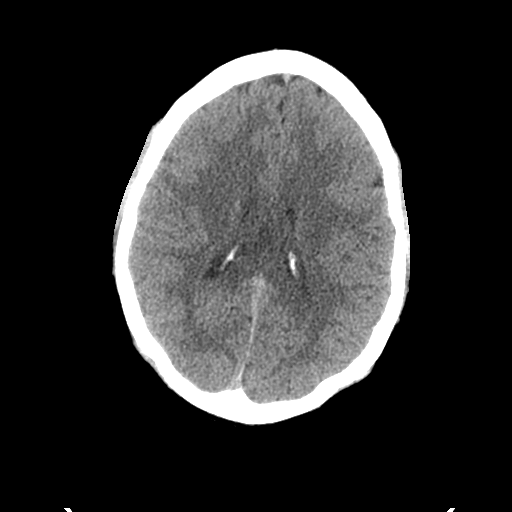
[im 19/29  brain]
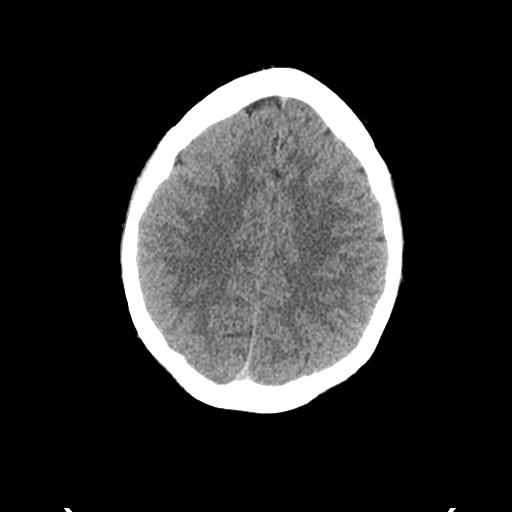
[im 22/29  brain]
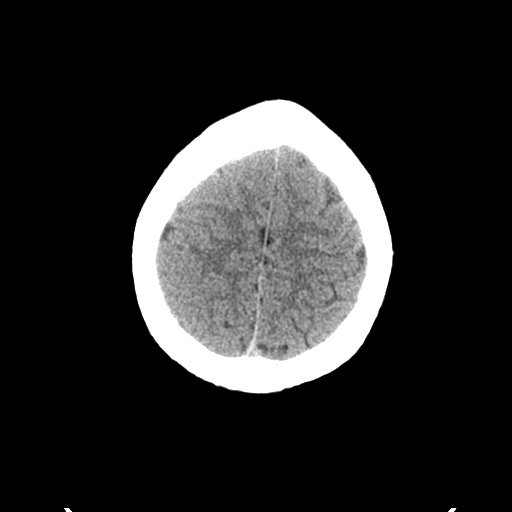
[im 24/29  brain]
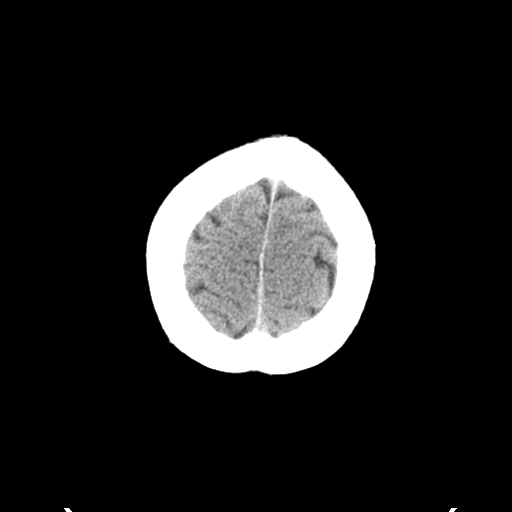
[im 24/29  bone]
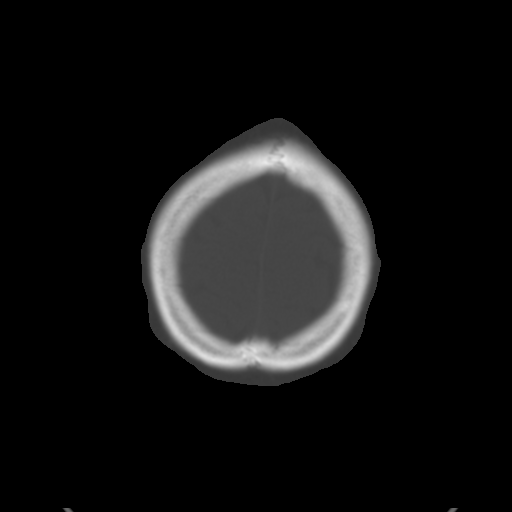
[im 27/29  brain]
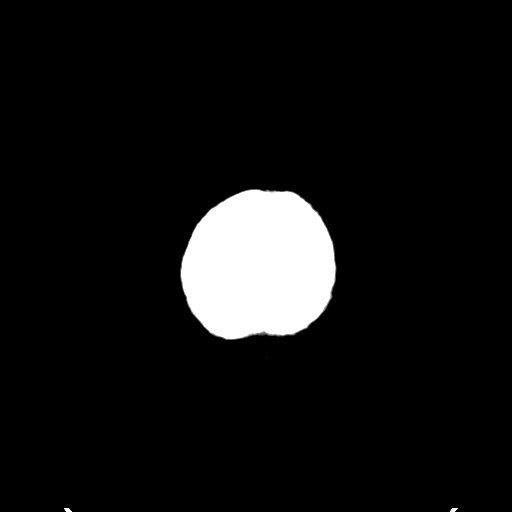

[Series 4: coronal soft tissue · coronal · 0.29mm/px · 3 of 61 slices shown]
[im 21/61  brain]
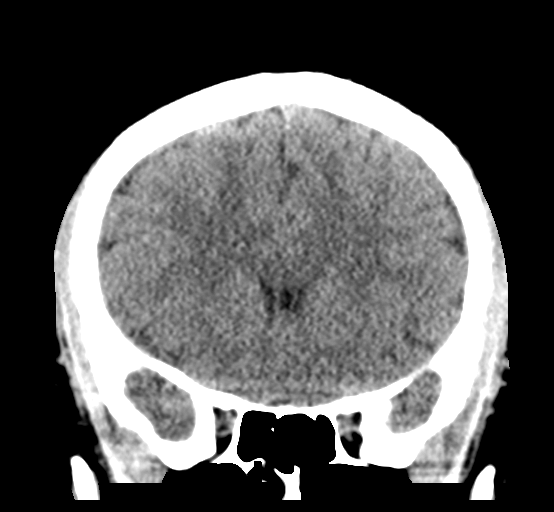
[im 27/61  brain]
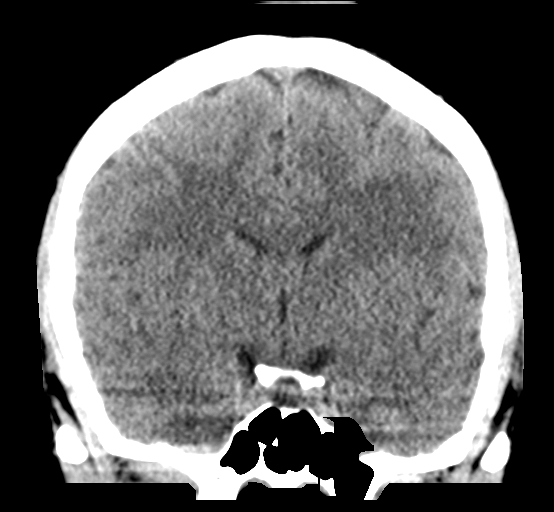
[im 34/61  brain]
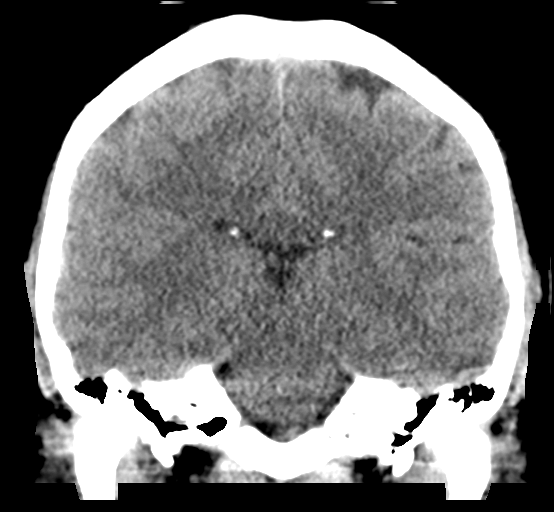

[Series 5: sagittal soft tissue · sagittal · 0.28mm/px · 3 of 50 slices shown]
[im 17/50  brain]
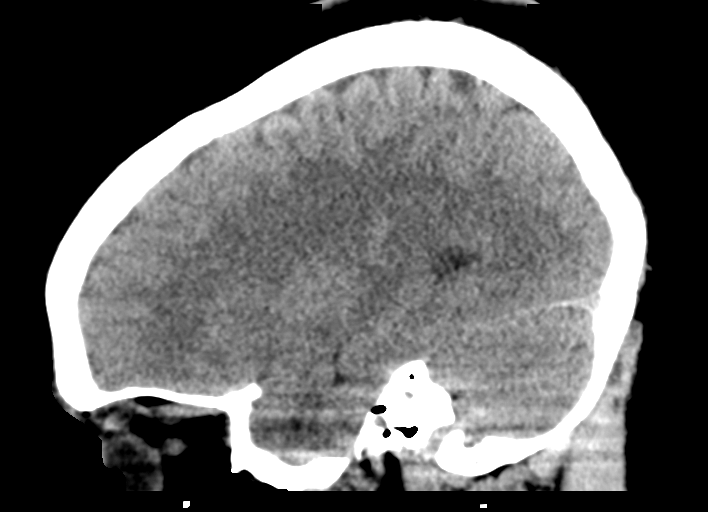
[im 25/50  brain]
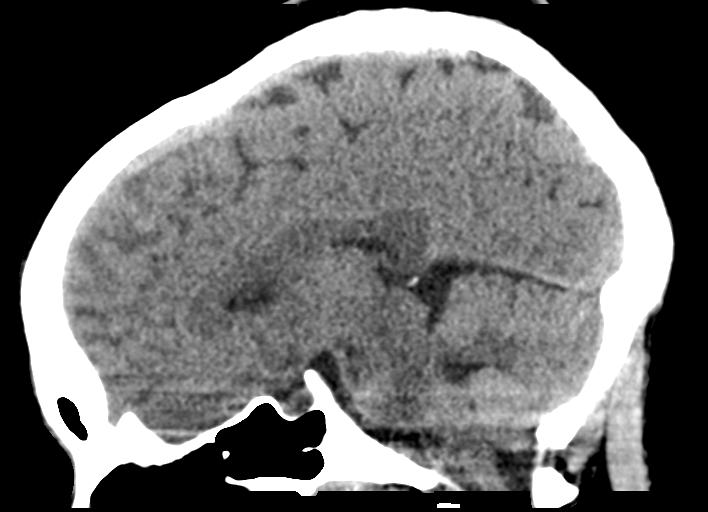
[im 33/50  brain]
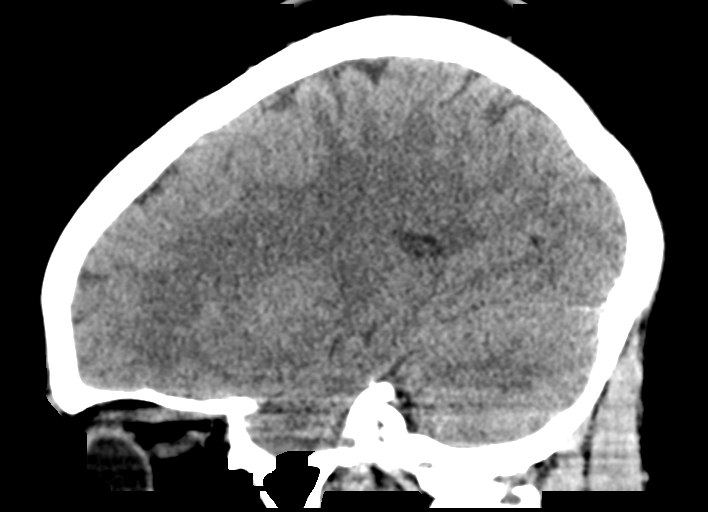

[16 of 46 positions shown; findings below may reference images not displayed]

FINDINGS: Brain: No evidence of acute infarction, hemorrhage, hydrocephalus,
extra-axial collection or mass lesion/mass effect.

Vascular: No hyperdense vessel or unexpected calcification.

Skull: Intact.  No focal lesion.

Sinuses/Orbits: Negative.

Other: None.
IMPRESSION: Normal head CT.

## 2019-09-23 MED ORDER — ZOLPIDEM TARTRATE 5 MG PO TABS: 5.0000 mg | ORAL_TABLET | Freq: Every evening | ORAL | Status: DC | PRN

## 2019-09-23 MED ORDER — ACETAMINOPHEN 325 MG PO TABS: 650.0000 mg | ORAL_TABLET | ORAL | Status: DC | PRN

## 2019-09-23 MED ORDER — OLANZAPINE 5 MG PO TABS
5.0000 mg | ORAL_TABLET | Freq: Every day | ORAL | Status: DC
  Administered 2019-09-23 (×2): 5 mg via ORAL
  Filled 2019-09-23 (×2): qty 1

## 2019-09-23 MED ORDER — ONDANSETRON HCL 4 MG PO TABS: 4.0000 mg | ORAL_TABLET | Freq: Three times a day (TID) | ORAL | Status: DC | PRN

## 2019-09-23 NOTE — ED Notes (Addendum)
Pt took shower °

## 2019-09-23 NOTE — ED Triage Notes (Signed)
Pt here with [employer] under IVC.  Parents state that she has been acting bizarre ever since having a baby one year ago.  She has been standing on dresser naked and talking to god.  And destroying her property.

## 2019-09-23 NOTE — Care Management (Signed)
  Writer referred patient to the following facilities:  [facility] Details  [facility]  [facility]  [facility]  [facility]  [facility]  [facility]  [facility]  [facility]  [facility]  [facility]  [facility]  [facility]  [facility]  [facility]

## 2019-09-23 NOTE — ED Notes (Signed)
Snack given to Pt.

## 2019-09-23 NOTE — BH Assessment (Signed)
Comprehensive Clinical Assessment (CCA) Screening, Triage and Referral Note  09/23/2019 Stephanie Cabrera [id number]   Patient is a 19 year old female presenting [facility] ED under IVC, initiated by family. Per EDP: "Patient was brought in under involuntary commitment by authorities.  According to the paperwork the police were called to the patient's house because the patient started destroying her TV and the lamp in her bedroom.  When family members walked into the room the patient was standing naked on the dresser with items and pieces around her.  Family was able to calm her down and then the patient indicated she had no memory of the event.  According to the IVC paperwork this has happened 2 times previously.  Family states the patient has told them that she has been speaking to God.  She has refused any medical evaluation.  Patient has mentioned to her mother according to the IVC paperwork about jumping off of a balcony.  She has not been sleeping well. "  Upon this counselor's exam patient is calm and cooperative. She states she does not remember what brought her to the ED. She does say she "talks to God." Patient denies SI/HI/AVH. She denies any prior psychiatric hospitalizations or outpatient treatment.   Patient is alert and oriented x 4. She is dressed in scrubs. Her speech is logical, eye contact is good, and thoughts are organized. Her mood is anxious and her affect is tearful. She has limited insight, judgement, and impulse control. She does not appear to be responding to internal stimuli at time of assessment. Patient appears mildly delusional.    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     ICD-10-CM   1. Abnormal behavior  R46.89   2. Auditory hallucination  R44.0     Patient Reported Information How did you hear about  [place]? No data recorded  Referral name: family member   Referral phone number: No data recorded Whom do you see for routine medical problems? I don't have a doctor   Practice/Facility Name: No data recorded  Practice/Facility Phone Number: No data recorded  Name of Contact: No data recorded  Contact Number: No data recorded  Contact Fax Number: No data recorded  Prescriber Name: No data recorded  Prescriber Address (if known): No data recorded What Is the Reason for Your Visit/Call Today? IVC  How Long Has This Been Causing You Problems? > than 6 months  Have You Recently Been in Any Inpatient Treatment (Hospital/Detox/Crisis Center/28-Day Program)? No   Name/Location of Program/Hospital:No data recorded  How Long Were You There? No data recorded  When Were You Discharged? No data recorded Have You Ever Received Services From [facility] Before? No   Who Do You See at [facility]? No data recorded Have You Recently Had Any Thoughts About Hurting Yourself? No   Are You Planning to Commit Suicide/Harm Yourself At This time?  No  Have you Recently Had Thoughts About Hurting Someone [name]? No   Explanation: No data recorded Have You Used Any Alcohol or Drugs in the Past 24 Hours? No   How Long Ago Did You Use Drugs or Alcohol?  No data recorded  What Did You Use and How Much? No data recorded What Do You Feel Would Help You the Most Today? Assessment Only  Do You Currently Have a Therapist/Psychiatrist?  No   Name of Therapist/Psychiatrist: No data recorded  Have You Been Recently Discharged From Any Office Practice or Programs? No   Explanation of Discharge From Practice/Program:  No data recorded    CCA Screening Triage Referral Assessment Type of Contact: Tele-Assessment   Is this Initial or Reassessment? Initial Assessment   Date Telepsych consult ordered in [facility]:  09/23/19   Time Telepsych consult ordered in [facility]:  0740  Patient Reported Information Reviewed?  Yes   Patient Left Without Being Seen? No data recorded  Reason for Not Completing Assessment: No data recorded Collateral Involvement: Mother- Ivin Booty  Does Patient Have a Nicol[place]? No data recorded  Name and Contact of Legal Guardian:  No data recorded If Minor and Not Living with Parent(s), Who has Custody? No data recorded Is CPS involved or ever been involved? Never  Is APS involved or ever been involved? Never  Patient Determined To Be At Risk for Harm To Self or Others Based on Review of Patient Reported Information or Presenting Complaint? Yes, for Self-Harm   Method: No data recorded  Availability of Means: No data recorded  Intent: No data recorded  Notification Required: No data recorded  Additional Information for Danger to Others Potential:  No data recorded  Additional Comments for Danger to Others Potential:  No data recorded  Are There Guns or Other Weapons in Your Home?  No data recorded   Types of Guns/Weapons: No data recorded   Are These Weapons Safely Secured?                              No data recorded   Who Could Verify You Are Able To Have These Secured:    No data recorded Do You Have any Outstanding Charges, Pending Court Dates, Parole/Probation? No data recorded Contacted To Inform of Risk of Harm To Self or Others: No data recorded [facility] of [facility]: [facility]  Does Patient Present under Involuntary Commitment? Yes   IVC Papers Initial File Date: 09/22/19   [place] of Residence: [place]  Patient Currently Receiving the Following Services: Not Receiving Services   Determination of Need: Emergent (2 hours)   Options For Referral: Inpatient Hospitalization   Orvis Brill, LCSW

## 2019-09-23 NOTE — ED Provider Notes (Addendum)
[facility] DEPT Provider Note   CSN: [id number] Arrival date & time: 09/23/19  2426     History Chief Complaint  Patient presents with   Medical Clearance    Stephanie Cabrera is a 19 y.o. female.  HPI   Patient presents to the ED for psychiatric evaluation.  Patient was brought in under involuntary commitment by authorities.  According to the paperwork the police were called to the patient's house because the patient started destroying her TV and the lamp in her bedroom.  When family members walked into the room the patient was standing naked on the dresser with items and pieces around her.  Family was able to calm her down and then the patient indicated she had no memory of the event.  According to the IVC paperwork this has happened 2 times previously.  Family states the patient has told them that she has been speaking to God.  She has refused any medical evaluation.  Patient has mentioned to her mother according to the IVC paperwork about jumping off of a balcony.  She has not been sleeping well.  Patient herself in the ER states she is not sure why she is here.  Initially she said she did not remember what happened last evening.  However when I asked her specifically about destroying things in her room she indicated she did remember that.  Patient states she has been having conversations with God.  The patient will not specifically say what the conversations are about.  She denies wanting to harm herself or anyone else.  She denies any substance abuse.  She denies any prior history of mental health problems    Past Surgical History:  Procedure Laterality Date   NO PAST SURGERIES       OB History    Gravida  1   Para  1   Term  1   Preterm      AB      Living  1     SAB      TAB      Ectopic      Multiple  0   Live Births  1           Family History  Problem Relation Age of Onset   Diabetes Paternal Grandmother     Diabetes Paternal Grandfather    Hypertension Paternal Grandfather     Social History   Tobacco Use   Smoking status: Never Smoker   Smokeless tobacco: Never Used  Scientific laboratory technician Use: Never used  Substance Use Topics   Alcohol use: Never    Alcohol/week: 0.0 standard drinks   Drug use: Never    Home Medications Prior to Admission medications   Medication Sig Start Date End Date Taking? Authorizing Provider  acetaminophen (TYLENOL) 325 MG tablet Take 2 tablets (650 mg total) by mouth every 4 (four) hours as needed (for pain scale < 4). Patient not taking: Reported on 09/23/2019 09/08/18   Luvenia Redden, PA-C    Allergies    Patient has no known allergies.  Review of Systems   Review of Systems  All other systems reviewed and are negative.   Physical Exam Updated Vital Signs BP 131/83 (BP Location: Right Arm)    Pulse 71    Temp 98.8 F (37.1 C) (Oral)    Resp 16    Ht 1.702 m (5\' 7" )    Wt 72.6 kg    LMP 09/16/2019 (  Exact Date)    SpO2 95%    Breastfeeding No    BMI 25.06 kg/m   Physical Exam Vitals and nursing note reviewed.  Constitutional:      General: She is not in acute distress.    Appearance: She is well-developed.  HENT:     Head: Normocephalic and atraumatic.     Right Ear: External ear normal.     Left Ear: External ear normal.  Eyes:     General: No scleral icterus.       Right eye: No discharge.        Left eye: No discharge.     Conjunctiva/sclera: Conjunctivae normal.  Neck:     Trachea: No tracheal deviation.  Cardiovascular:     Rate and Rhythm: Normal rate and regular rhythm.  Pulmonary:     Effort: Pulmonary effort is normal. No respiratory distress.     Breath sounds: Normal breath sounds. No stridor. No wheezing or rales.  Abdominal:     General: Bowel sounds are normal. There is no distension.     Palpations: Abdomen is soft.     Tenderness: There is no abdominal tenderness. There is no guarding or rebound.    Musculoskeletal:        General: No tenderness.     Cervical back: Neck supple.  Skin:    General: Skin is warm and dry.     Findings: No rash.  Neurological:     Mental Status: She is alert and oriented to person, place, and time.     Cranial Nerves: No cranial nerve deficit (no facial droop, extraocular movements intact, no slurred speech).     Sensory: No sensory deficit.     Motor: No abnormal muscle tone or seizure activity.     Coordination: Coordination normal.     Comments: Steady gait  Psychiatric:        Mood and Affect: Mood is not anxious. Affect is not labile, blunt or angry.        Speech: Speech is not rapid and pressured or slurred.        Behavior: Behavior is not agitated or aggressive. Behavior is cooperative.        Thought Content: Thought content does not include homicidal or suicidal ideation.     ED Results / Procedures / Treatments   Labs (all labs ordered are listed, but only abnormal results are displayed) Labs Reviewed  COMPREHENSIVE METABOLIC PANEL - Abnormal; Notable for the following components:      Result Value   Glucose, Bld 103 (*)    Calcium 8.5 (*)    Albumin 3.4 (*)    All other components within normal limits  SARS CORONAVIRUS 2 BY RT PCR (HOSPITAL ORDER, PERFORMED IN [facility])  ETHANOL  CBC WITH DIFFERENTIAL/PLATELET  TSH  RAPID URINE DRUG SCREEN, HOSP PERFORMED  I-STAT BETA HCG BLOOD, ED ([facility], [facility], [facility] ONLY)    EKG EKG Interpretation  Date/Time:  Thursday September 23 2019 08:18:28 EDT Ventricular Rate:  67 PR Interval:  216 QRS Duration: 76 QT Interval:  362 QTC Calculation: 382 R Axis:   53 Text Interpretation: Sinus rhythm with 1st degree A-V block Otherwise normal ECG No old tracing to compare Confirmed by Linwood Dibbles [id number]) on 09/23/2019 8:25:43 AM   Radiology CT Head Wo Contrast  Result Date: 09/23/2019 CLINICAL DATA:  Altered behavior for approximately 1 year since giving birth. EXAM: CT HEAD WITHOUT  CONTRAST TECHNIQUE: Contiguous axial images were  obtained from the base of the skull through the vertex without intravenous contrast. COMPARISON:  None. FINDINGS: Brain: No evidence of acute infarction, hemorrhage, hydrocephalus, extra-axial collection or mass lesion/mass effect. Vascular: No hyperdense vessel or unexpected calcification. Skull: Intact.  No focal lesion. Sinuses/Orbits: Negative. Other: None. IMPRESSION: Normal head CT. Electronically Signed   By: Drusilla Kanner M.D.   On: 09/23/2019 08:40    Procedures Procedures (including critical care time)  Medications Ordered in ED Medications  acetaminophen (TYLENOL) tablet 650 mg (has no administration in time range)  zolpidem (AMBIEN) tablet 5 mg (has no administration in time range)  ondansetron (ZOFRAN) tablet 4 mg (has no administration in time range)    ED Course  I have reviewed the triage vital signs and the nursing notes.  Pertinent labs & imaging results that were available during my care of the patient were reviewed by me and considered in my medical decision making (see chart for details).  Clinical Course as of Sep 22 1412  Thu Sep 23, 2019  0745 Patient is currently calm and cooperative.  Sounds like she has some memory of the event but is not able to expand on it at this time.  We will proceed with medical clearance evaluation   [JK]  0855 CBC normal.  Metabolic panel remarkable.   [JK]  0856 Head CT without acute findings.   [JK]  1009 TSH normal.   [JK]  1122 Plan is for inpatient treatment.  Initiate medications   [JK]    Clinical Course User Index [JK] Linwood Dibbles, MD   MDM Rules/Calculators/A&P                         The patient has been placed in psychiatric observation due to the need to provide a safe environment for the patient while obtaining psychiatric consultation and evaluation, as well as ongoing medical and medication management to treat the patient's condition.  The patient has been placed  under full IVC at this time.  Labs reviewed.  No acute abnormalities noted.  TSH is normal.  CT is also normal.  No acute medical findings to explain her behavior changes.  Suspect psychiatric etiology.  Patient is medically cleared for psychiatric evaluation. Final Clinical Impression(s) / ED Diagnoses Final diagnoses:  Abnormal behavior  Auditory hallucination      Linwood Dibbles, MD 09/23/19 1011 Ed workup addendum   Linwood Dibbles, MD 09/23/19 1414

## 2019-09-24 ENCOUNTER — Other Ambulatory Visit: Payer: Self-pay

## 2019-09-24 ENCOUNTER — Inpatient Hospital Stay (HOSPITAL_COMMUNITY)
Admit: 2019-09-24 | Discharge: 2019-09-28 | DRG: 885 | Disposition: A | Discharge status: Home / Self Care | Payer: BC Managed Care – PPO | Source: Intra-hospital | Attending: Psychiatry | Admitting: Psychiatry

## 2019-09-24 ENCOUNTER — Encounter (HOSPITAL_COMMUNITY): Payer: Self-pay | Admitting: Psychiatry

## 2019-09-24 DIAGNOSIS — F23 Brief psychotic disorder: Principal | ICD-10-CM | POA: Diagnosis present

## 2019-09-24 DIAGNOSIS — Z915 Personal history of self-harm: Secondary | ICD-10-CM | POA: Diagnosis not present

## 2019-09-24 DIAGNOSIS — Z833 Family history of diabetes mellitus: Secondary | ICD-10-CM

## 2019-09-24 DIAGNOSIS — Z20822 Contact with and (suspected) exposure to covid-19: Secondary | ICD-10-CM | POA: Diagnosis present

## 2019-09-24 DIAGNOSIS — Z8249 Family history of ischemic heart disease and other diseases of the circulatory system: Secondary | ICD-10-CM | POA: Diagnosis not present

## 2019-09-24 DIAGNOSIS — Z560 Unemployment, unspecified: Secondary | ICD-10-CM

## 2019-09-24 DIAGNOSIS — F060 Psychotic disorder with hallucinations due to known physiological condition: Secondary | ICD-10-CM | POA: Diagnosis present

## 2019-09-24 MED ORDER — MAGNESIUM HYDROXIDE 400 MG/5ML PO SUSP: 30.0000 mL | Freq: Every day | ORAL | Status: DC | PRN

## 2019-09-24 MED ORDER — ACETAMINOPHEN 325 MG PO TABS: 650.0000 mg | ORAL_TABLET | Freq: Four times a day (QID) | ORAL | Status: DC | PRN

## 2019-09-24 MED ORDER — TRAZODONE HCL 50 MG PO TABS
50.0000 mg | ORAL_TABLET | Freq: Every evening | ORAL | Status: DC | PRN
  Administered 2019-09-25: 50 mg via ORAL

## 2019-09-24 MED ORDER — HYDROXYZINE HCL 25 MG PO TABS
25.0000 mg | ORAL_TABLET | Freq: Three times a day (TID) | ORAL | Status: DC | PRN
  Administered 2019-09-25: 25 mg via ORAL

## 2019-09-24 MED ORDER — ALUM & MAG HYDROXIDE-SIMETH 200-200-20 MG/5ML PO SUSP: 30.0000 mL | ORAL | Status: DC | PRN

## 2019-09-24 NOTE — ED Notes (Signed)
Call [facility] to give report. A nurse was not available. Was asked to call back in 15 minutes

## 2019-09-24 NOTE — ED Notes (Signed)
Pt off unit to [facility] per provider. Pt alert, cooperative, calm, no s/s of distress. Pt smiling. DC information given to [employer] for [facility]. Belongings given to [employer] for facility . Pt ambulatory off unit, escorted and transported by [employer].

## 2019-09-24 NOTE — Progress Notes (Signed)
Pt visible on the unit this evening, pt stated she blacked out when she did the things on the IVC. IVC process and unit process was explained to pt with appeared understanding.     09/24/19 2100  Psych Admission Type (Psych Patients Only)  Admission Status Involuntary  Psychosocial Assessment  Patient Complaints Anxiety  Eye Contact Fair  Facial Expression Animated;Anxious;Pensive  Affect Anxious;Preoccupied  Furniture conservator/restorer;Restless;Slow  Appearance/Hygiene In scrubs  Behavior Characteristics Cooperative  Mood Anxious  Thought Process  Coherency WDL  Content WDL  Delusions None reported or observed  Perception WDL  Hallucination None reported or observed  Judgment Impaired  Confusion Mild  Danger to Self  Current suicidal ideation? Denies  Danger to Others  Danger to Others None reported or observed

## 2019-09-24 NOTE — Progress Notes (Signed)
Patient accepted to [facility], room 504-1. Please call report to adult unit [phone].

## 2019-09-24 NOTE — ED Notes (Signed)
Pt alert, cooperative, no s/s of distress. Pt labile with feelings at times, tearful at times.

## 2019-09-24 NOTE — Tx Team (Cosign Needed)
Initial Treatment Plan 09/24/2019 6:54 PM Stephanie Cabrera CVE:[id number]    PATIENT STRESSORS: Health problems Marital or family conflict Traumatic event   PATIENT STRENGTHS: Ability for insight Capable of independent living Communication skills Motivation for treatment/growth Supportive family/friends   PATIENT IDENTIFIED PROBLEMS: "break from reality"  anxiety  confusion                 DISCHARGE CRITERIA:  Ability to meet basic life and health needs Improved stabilization in mood, thinking, and/or behavior Medical problems require only outpatient monitoring Motivation to continue treatment in a less acute level of care  PRELIMINARY DISCHARGE PLAN: Attend PHP/IOP Outpatient therapy Return to previous living arrangement  PATIENT/FAMILY INVOLVEMENT: This treatment plan has been presented to and reviewed with the patient, Stephanie Cabrera.  The patient and family have been given the opportunity to ask questions and make suggestions.  Raylene Miyamoto, RN 09/24/2019, 6:54 PM

## 2019-09-24 NOTE — Progress Notes (Signed)
Nursing Admission Note:   Pt is a 19 y/o female, IVC. Per [facility] ED report, pt had a brief psychotic episode, which is the 3rd similar episode since pt gave birth about a year ago. Per nurse report, pt was at her mother's house, was found nude on her dresser, and had destroyed a TV. Pt reports she does not remember this. Per report pt's thoughts are disorganized, has been labile, tearful, then laughing, and overall calm and cooperative. Pt was received in the search room, was cooperative with the admission process, is noted to be pleasant, alert and oriented to person, place, and time, but not to situation. Pt has poor insight, when asked why pt thinks she is here being admitted, pt states, "I have no idea." Pt's mood is elevated, pt is hyperverbal, affect overly bright, speech is loud and pressured. Pt's UDS is negative, Covid negative, HCG negative, BAL negative. Pt denies any history of medical issues. Pt reports she is unemployed, lives with her mother and her 20 year old child and her child is now with her sister while pt is in the hospital. Pt denies suicidal and homicidal ideation, denies hallucinations, denies feelings of depression and anxiety. Pt reports this is her first psychiatric hospitalization. Will continue to monitor pt per Q15 minute face checks and monitor for safety and progress.

## 2019-09-25 DIAGNOSIS — F23 Brief psychotic disorder: Principal | ICD-10-CM

## 2019-09-25 LAB — TSH: TSH: 1.195 u[IU]/mL (ref 0.350–4.500)

## 2019-09-25 LAB — HEMOGLOBIN A1C
Hgb A1c MFr Bld: 5.3 % (ref 4.8–5.6)
Mean Plasma Glucose: 105.41 mg/dL

## 2019-09-25 LAB — LIPID PANEL
Cholesterol: 183 mg/dL (ref 0–200)
HDL: 67 mg/dL (ref >40)
LDL Cholesterol: 104 mg/dL — ABNORMAL HIGH (ref 0–99)
Total CHOL/HDL Ratio: 2.7 RATIO
Triglycerides: 60 mg/dL (ref <150)
VLDL: 12 mg/dL (ref 0–40)

## 2019-09-25 MED ORDER — OLANZAPINE 5 MG PO TABS: 5.0000 mg | ORAL_TABLET | Freq: Every day | ORAL | Status: DC

## 2019-09-25 MED ORDER — ZIPRASIDONE MESYLATE 20 MG IM SOLR: 10.0000 mg | INTRAMUSCULAR | Status: DC | PRN

## 2019-09-25 MED ORDER — OLANZAPINE 5 MG PO TBDP: 5.0000 mg | ORAL_TABLET | Freq: Three times a day (TID) | ORAL | Status: DC | PRN

## 2019-09-25 MED ORDER — RISPERIDONE 1 MG PO TABS
1.0000 mg | ORAL_TABLET | Freq: Two times a day (BID) | ORAL | Status: DC
  Administered 2019-09-25 – 2019-09-26 (×2): 1 mg via ORAL
  Filled 2019-09-25 (×8): qty 1

## 2019-09-25 MED ORDER — LORAZEPAM 1 MG PO TABS: 1.0000 mg | ORAL_TABLET | ORAL | Status: DC | PRN

## 2019-09-25 MED ORDER — RISPERIDONE 2 MG PO TBDP: 2.0000 mg | ORAL_TABLET | Freq: Three times a day (TID) | ORAL | Status: DC | PRN

## 2019-09-25 MED ORDER — LORAZEPAM 0.5 MG PO TABS: 0.5000 mg | ORAL_TABLET | Freq: Four times a day (QID) | ORAL | Status: DC | PRN

## 2019-09-25 NOTE — Progress Notes (Signed)
Pt presents with anxiety. Denied SI/HI/AVH.  Pt presents with labile affect and mood.  RN established rapport with pt and encouraged pt to express her feelings and concerns.  RN administered medications per MD orders.  Q 15 min safety checks remained in place.  Pt is interacting with peers on the unit, has been eating her meals and took a shower this afternoon.  Pt is calm at this time.  RN will continue to monitor and provide support as needed.

## 2019-09-25 NOTE — H&P (Addendum)
Psychiatric Admission Assessment Adult  Patient Identification: Stephanie Cabrera MRN:  [id number] Date of Evaluation:  09/25/2019 Chief Complaint:  " I was standing on a dresser" Principal Diagnosis: Psychosis, Unspecified  Diagnosis:  Active Problems:   Brief psychotic disorder (HCC)  History of Present Illness: 19 year old female, lives with her mother and her one year old child. She presented to ED on 6/17 under IVC. Parent reported that patient had been acting strangely , bizarrely, standing on a dresser  internally preoccupied . Currently she states she does not recollect above events and that last thing she remembers " I was telling my mother I was doing OK". According to IVC paperwork patient has had prior similar episodes . Today she acknowledges auditory hallucinations , which she states are conversations with God and angels. States she is not concerned about these because " they say good things " to her.  She denies drug or alcohol abuse . Admission BAL and UDS are negative. She denies mood symptoms or depression leading up to admission and states her mood has been " all right". Currently denies neuro-vegetative symptoms and reports sleep, appetite, energy level as normal and denies anhedonia. She also denies having had any suicidal ideations or self injurious ideations leading up to admission. She does endorse anxiety, and states she tends to worry excessively.  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 She also states she was advised not to work after seizure like episode about a month ago. States she had a seizure like episode ( describes as " shaking and passing out") one month ago. Denies any seizures or seizure like episodes before or since this episode . With patient's consent I spoke with her mother via phone ( [phone]). Mother reports that she feels patient has had some behavioral changes for  a few weeks. For example, she had recently expressed fear that the tree outside of her home was falling on the house and required significant reassurance from mother. She has also been reporting auditory hallucinations, occurring mainly at nighttime. Mother states that on day of admission she was standing on a dresser, banging wall, for no apparent reason. (The TV was knocked over/ broke  but reportedly this was not on purpose ). Mother also confirms that patient had a seizure episode a month ago. At the time went to ED but left without being seen .She has had no further episodes since then.  * Of note , a head CT scan was done on 09/23/2019- normal   Associated Signs/Symptoms: Depression Symptoms:  Does not endorse (Hypo) Manic Symptoms: none noted and reported . Anxiety Symptoms:  Reports history of anxiety and states she feels she worries excessively Psychotic Symptoms:  Reports auditory hallucinations as above. Currently not internally preoccupied . PTSD Symptoms: Denies  Total Time spent with patient: 45 minutes  Past Psychiatric History: denies prior psychiatric admissions . Denies past history of suicide attempts. Remote history of self cutting episodes when  in Middle School but not since then, denies prior history of psychosis, but describes auditory hallucinations for several weeks to months.  Denies history of severe depressive episodes, does report mild depression following having her child , who is now one year old. Denies prior history of mania or hypomania. Endorses history of anxiety, described as " worrying a lot".   Is the patient at risk to self? Yes.    Has the patient  been a risk to self in the past 6 months? No.  Has the patient been a risk to self within the distant past? No.  Is the patient a risk to others? No.  Has the patient been a risk to others in the past 6 months? No.  Has the patient been a risk to others within the distant past? No.   Prior Inpatient Therapy:   denies  Prior Outpatient Therapy:  none   Alcohol Screening: 1. How often do you have a drink containing alcohol?: Never 2. How many drinks containing alcohol do you have on a typical day when you are drinking?: 1 or 2 3. How often do you have six or more drinks on one occasion?: Never AUDIT-C Score: 0 4. How often during the last year have you found that you were not able to stop drinking once you had started?: Never 5. How often during the last year have you failed to do what was normally expected from you because of drinking?: Never 6. How often during the last year have you needed a first drink in the morning to get yourself going after a heavy drinking session?: Never 7. How often during the last year have you had a feeling of guilt of remorse after drinking?: Never 8. How often during the last year have you been unable to remember what happened the night before because you had been drinking?: Never 9. Have you or someone else been injured as a result of your drinking?: No 10. Has a relative or friend or a doctor or another health worker been concerned about your drinking or suggested you cut down?: No Alcohol Use Disorder Identification Test Final Score (AUDIT): 0 Substance Abuse History in the last 12 months:  Denies alcohol or drug abuse  Consequences of Substance Abuse: Denies  Previous Psychotropic Medications: reports she has not taken psychiatric medications in the past  Psychological Evaluations: No  Past Medical History: Denies medical illnesses. States she had a seizure like episode about a month ago . She has had no further seizure like episodes since then. Denies history of head trauma. Past Medical History:  Diagnosis Date  . Allergy   . Medical history non-contributory     Past Surgical History:  Procedure Laterality Date  . NO PAST SURGERIES     Family History: patient lives with mother, and reports she has a good relationship with her father . Has two sisters   Family History  Problem Relation Age of Onset  . Diabetes Paternal Grandmother   . Diabetes Paternal Grandfather   . Hypertension Paternal Grandfather    Family Psychiatric  History: denies history of mental illness in family  Tobacco Screening:  does not smoke or vape  Social History: 19 year old female, has a one year old child, lives with mother. Currently unemployed  Social History   Substance and Sexual Activity  Alcohol Use Never  . Alcohol/week: 0.0 standard drinks     Social History   Substance and Sexual Activity  Drug Use Never    Additional Social History:  Allergies:  No Known Allergies Lab Results:  Results for orders placed or performed during the hospital encounter of 09/24/19 (from the past 48 hour(s))  Hemoglobin A1c     Status: None   Collection Time: 09/25/19  6:39 AM  Result Value Ref Range   Hgb A1c MFr Bld 5.3 4.8 - 5.6 %    Comment: (NOTE) Pre diabetes:  5.7%-6.4%  Diabetes:              >6.4%  Glycemic control for   <7.0% adults with diabetes    Mean Plasma Glucose 105.41 mg/dL    Comment: Performed at [facility], [address], [place], [place] [address]  Lipid panel     Status: Abnormal   Collection Time: 09/25/19  6:39 AM  Result Value Ref Range   Cholesterol 183 0 - 200 mg/dL   Triglycerides 60 <440 mg/dL   HDL 67 >10 mg/dL   Total CHOL/HDL Ratio 2.7 RATIO   VLDL 12 0 - 40 mg/dL   LDL Cholesterol 272 (H) 0 - 99 mg/dL    Comment:        Total Cholesterol/HDL:CHD Risk Coronary Heart Disease Risk Table                     Men   Women  1/2 Average Risk   3.4   3.3  Average Risk       5.0   4.4  2 X Average Risk   9.6   7.1  3 X Average Risk  23.4   11.0        Use the calculated Patient Ratio above and the CHD Risk Table to determine the patient's CHD Risk.        ATP III CLASSIFICATION (LDL):  <100     mg/dL   Optimal  536-644  mg/dL   Near or Above                    Optimal  130-159  mg/dL   Borderline   034-742  mg/dL   High  >595     mg/dL   Very High Performed at [facility], [address] [place], [place] [address]   TSH     Status: None   Collection Time: 09/25/19  6:39 AM  Result Value Ref Range   TSH 1.195 0.350 - 4.500 uIU/mL    Comment: Performed by a 3rd Generation assay with a functional sensitivity of <=0.01 uIU/mL. Performed at [facility], [address], [place], [place] [address]     Blood Alcohol level:  Lab Results  Component Value Date   ETH <10 09/23/2019    Metabolic Disorder Labs:  Lab Results  Component Value Date   HGBA1C 5.3 09/25/2019   MPG 105.41 09/25/2019   No results found for: PROLACTIN Lab Results  Component Value Date   CHOL 183 09/25/2019   TRIG 60 09/25/2019   HDL 67 09/25/2019   CHOLHDL 2.7 09/25/2019   VLDL 12 09/25/2019   LDLCALC 104 (H) 09/25/2019    Current Medications: Current Facility-Administered Medications  Medication Dose Route Frequency Provider Last Rate Last Admin  . acetaminophen (TYLENOL) tablet 650 mg  650 mg Oral Q6H PRN Nira Conn A, NP      . alum & mag hydroxide-simeth (MAALOX/MYLANTA) 200-200-20 MG/5ML suspension 30 mL  30 mL Oral Q4H PRN Nira Conn A, NP      . hydrOXYzine (ATARAX/VISTARIL) tablet 25 mg  25 mg Oral TID PRN Nira Conn A, NP   25 mg at 09/25/19 0226  . magnesium hydroxide (MILK OF MAGNESIA) suspension 30 mL  30 mL Oral Daily PRN Nira Conn A, NP      . traZODone (DESYREL) tablet 50 mg  50 mg Oral QHS PRN Nira Conn A, NP   50 mg at  09/25/19 0226   PTA Medications: Medications Prior to Admission  Medication Sig Dispense Refill Last Dose  . acetaminophen (TYLENOL) 325 MG tablet Take 2 tablets (650 mg total) by mouth every 4 (four) hours as needed (for pain scale < 4). (Patient not taking: Reported on 09/23/2019) 30 tablet 0     Musculoskeletal: Strength & Muscle Tone: within normal limits Gait & Station: normal Patient leans:  N/A  Psychiatric Specialty Exam: Physical Exam  Review of Systems  Constitutional: Negative.   HENT: Negative.   Eyes: Negative.   Respiratory: Negative.   Cardiovascular: Negative.   Gastrointestinal: Negative.   Endocrine: Negative.   Genitourinary: Negative.   Musculoskeletal: Negative.   Neurological: Positive for seizures.       Reports isolated seizure like episode about one month ago  Hematological: Negative.   Psychiatric/Behavioral: Positive for hallucinations.    Blood pressure 100/62, pulse 91, temperature 98.2 F (36.8 C), temperature source Oral, resp. rate 18, height 5\' 7"  (1.702 m), weight 68.9 kg, last menstrual period 09/16/2019, SpO2 97 %, not currently breastfeeding.Body mass index is 23.81 kg/m.  General Appearance: Casual  Eye Contact:  Good  Speech:  Normal Rate  Volume:  Normal  Mood:  reports " I feel OK", denies feeling depressed at this time  Affect:  somewhat labile   Thought Process:  Linear and Descriptions of Associations: Circumstantial  Orientation:  Other:  oriented to " hospital in [place]" and to June /2021, not to day of week  Thought Content:  reports auditory hallucinations, described as conversations with God and angels, currently does not present internally preoccupied   Suicidal Thoughts:  No denies suicidal or self injurious ideations, denies homicidal or violent ideations, contracts for safety on unit   Homicidal Thoughts:  No  Memory:  recent and remote fair- as noted, reports having little recollection of event that led to admission  Judgement:  Fair  Insight:  limited   Psychomotor Activity:  Normal- no current psychomotor agitation or restlessness   Concentration:  Concentration: Good and Attention Span: Good  Recall:  Fair  Fund of Knowledge:  Good  Language:  Good  Akathisia:  Negative  Handed:  Right  AIMS (if indicated):     Assets:  Communication Skills Desire for Improvement Resilience  ADL's:  Intact  Cognition:   WNL  Sleep:  Number of Hours: 6.25    Treatment Plan Summary: Daily contact with patient to assess and evaluate symptoms and progress in treatment, Medication management, Plan inpatient treatment and medications as below  Observation Level/Precautions:  15 minute checks  Laboratory:  as needed- labs reviewed ( 6/18 EKG QTc 382)   Psychotherapy:  Milieu, group therapy   Medications:  Reviewed options . Will start Risperidone trial- this antipsychotic may be considered lower in so far as risk of lowering seizure threshold  Risperidone 1 mgr BID. Ativan 0.5 mgrs Q 6 hours PRN for anxiety Agitation protocol for acute psychotic agitation if needed   Consultations:  As needed   Discharge Concerns:  -  Estimated LOS: 3-4 days   Other:     Physician Treatment Plan for Primary Diagnosis: psychosis, unspecified  Long Term Goal(s): Improvement in symptoms so as ready for discharge  Short Term Goals: Ability to identify changes in lifestyle to reduce recurrence of condition will improve, Ability to verbalize feelings will improve, Ability to disclose and discuss suicidal ideas, Ability to demonstrate self-control will improve, Ability to identify and develop effective coping behaviors will improve,  Ability to maintain clinical measurements within normal limits will improve and Compliance with prescribed medications will improve  Physician Treatment Plan for Secondary Diagnosis: psychosis, unspecified  Long Term Goal(s): Improvement in symptoms so as ready for discharge  Short Term Goals: Ability to identify changes in lifestyle to reduce recurrence of condition will improve, Ability to verbalize feelings will improve, Ability to disclose and discuss suicidal ideas, Ability to demonstrate self-control will improve, Ability to identify and develop effective coping behaviors will improve and Ability to maintain clinical measurements within normal limits will improve  I certify that inpatient services  furnished can reasonably be expected to improve the patient's condition.    Craige Cotta, MD 6/19/20211:38 PM

## 2019-09-25 NOTE — [facility] Group Notes (Signed)
  [facility] LCSW Group Therapy Note  Date/Time:  09/25/2019 11:15AM-12:00PM  Type of Therapy and Topic:  Group Therapy:  Feelings About Hospitalization  Participation Level:  Active   Description of Group This process group involved patients discussing their feelings related to being hospitalized, as well as the benefits they see to being in the hospital.  These feelings and benefits were itemized.  The group then brainstormed specific ways in which they could seek those same benefits when they discharge and return home.  Therapeutic Goals Patient will identify and describe positive and negative feelings related to hospitalization Patient will verbalize benefits of hospitalization to themselves personally Patients will brainstorm together ways they can obtain similar benefits in the outpatient setting, identify barriers to wellness and possible solutions  Summary of Patient Progress:  The patient expressed her primary feelings about being hospitalized are that she is tired of being here, and even bored, so wants to go home.  She misses her 1yo daughter.  She did not talk much during group but was attentive to others.  Therapeutic Modalities Cognitive Behavioral Therapy Motivational Interviewing    Stephanie Mantle, LCSW 09/25/2019, 2:35 PM

## 2019-09-25 NOTE — Progress Notes (Signed)
Pt standing on the other bed in her room , pt talking loudly to people not seen by staff. Pt appeared to be praying / talking to GOD at times. Pt was informed that she needed to quiet down , so she would not wake up other patients. Pt was given PRN Trazodone / Vistaril per [facility] and pt made no roommate at this time

## 2019-09-25 NOTE — [facility] Suicide Risk Assessment (Signed)
[facility] Admission Suicide Risk Assessment   Nursing information obtained from:  Patient Demographic factors:  Low socioeconomic status, Adolescent or young adult Current Mental Status:  NA Loss Factors:  Decline in physical health Historical Factors:  Impulsivity Risk Reduction Factors:  Positive therapeutic relationship, Positive social support, Sense of responsibility to family, Living with another person, especially a relative, Positive coping skills or problem solving skills  Total Time spent with patient: 45 minutes Principal Problem: Psychosis, Unspecified  Diagnosis:  Active Problems:   Brief psychotic disorder (HCC)  Subjective Data:   Continued Clinical Symptoms:  Alcohol Use Disorder Identification Test Final Score (AUDIT): 0 The "Alcohol Use Disorders Identification Test", Guidelines for Use in Primary Care, Second Edition.  World Science writer [facility]). Score between 0-7:  no or low risk or alcohol related problems. Score between 8-15:  moderate risk of alcohol related problems. Score between 16-19:  high risk of alcohol related problems. Score 20 or above:  warrants further diagnostic evaluation for alcohol dependence and treatment.   CLINICAL FACTORS:  19 year old female, presented to ED under IVC due to psychotic symptoms/disorganized behavior. She had been found standing on a dresser, banging wall for no apparent reason. She has endorsed auditory hallucinations, which she describes as speaking with God and angels. No prior psychiatric history and patient denies alcohol or drug abuse.  Mother, with whom she lives, states she has been presenting with some behavioral changes over recent weeks. About a month ago patient had an isolated seizure like episode , without prior history of seizures or trauma, and has had none since. She went to ED at the time but left without being seen. Of note, a head CT scan was done 6/17- normal.  Psychiatric Specialty Exam: Physical Exam   Review of Systems  Blood pressure 100/62, pulse 91, temperature 98.2 F (36.8 C), temperature source Oral, resp. rate 18, height 5\' 7"  (1.702 m), weight 68.9 kg, last menstrual period 09/16/2019, SpO2 97 %, not currently breastfeeding.Body mass index is 23.81 kg/m.  See admit note MSE   COGNITIVE FEATURES THAT CONTRIBUTE TO RISK:  Closed-mindedness and Loss of executive function    SUICIDE RISK:   Moderate:  Frequent suicidal ideation with limited intensity, and duration, some specificity in terms of plans, no associated intent, good self-control, limited dysphoria/symptomatology, some risk factors present, and identifiable protective factors, including available and accessible social support.  PLAN OF CARE: Patient will be admitted to inpatient psychiatric unit for stabilization and safety. Will provide and encourage milieu participation. Provide medication management and maked adjustments as needed.  Will follow daily.    I certify that inpatient services furnished can reasonably be expected to improve the patient's condition.   11/16/2019, MD 09/25/2019, 2:35 PM

## 2019-09-26 MED ORDER — RISPERIDONE 2 MG PO TABS
2.0000 mg | ORAL_TABLET | Freq: Every day | ORAL | Status: DC
  Administered 2019-09-26 – 2019-09-27 (×2): 2 mg via ORAL
  Filled 2019-09-26 (×4): qty 1

## 2019-09-26 MED ORDER — RISPERIDONE 1 MG PO TABS
1.0000 mg | ORAL_TABLET | ORAL | Status: DC
  Administered 2019-09-27 – 2019-09-28 (×2): 1 mg via ORAL
  Filled 2019-09-26 (×5): qty 1

## 2019-09-26 NOTE — [facility] Suicide Risk Assessment (Signed)
[facility] INPATIENT:  Family/Significant Other Suicide Prevention Education  Suicide Prevention Education:  Education Completed; Stephanie Cabrera, mother of the patient  has been identified by the patient as the family member/significant other with whom the patient will be residing, and identified as the person(s) who will aid the patient in the event of a mental health crisis (suicidal ideations/suicide attempt).  With written consent from the patient, the family member/significant other has been provided the following suicide prevention education, prior to the and/or following the discharge of the patient.  The suicide prevention education provided includes the following:  Suicide risk factors  Suicide prevention and interventions  National Suicide Hotline telephone number  [facility] assessment telephone number  [facility] Emergency Assistance 911  [facility] and/or Residential Mobile Crisis Unit telephone number  Request made of family/significant other to:  Remove weapons (e.g., guns, rifles, knives), all items previously/currently identified as safety concern.    Remove drugs/medications (over-the-counter, prescriptions, illicit drugs), all items previously/currently identified as a safety concern.  The family member/significant other verbalizes understanding of the suicide prevention education information provided.  The family member/significant other agrees to remove the items of safety concern listed above.  Stephanie Cabrera 09/26/2019, 10:41 AM

## 2019-09-26 NOTE — [facility] Counselor (Signed)
Adult Comprehensive Assessment  Patient ID: Stephanie Cabrera, female   DOB: [birth year], 19 y.o.   MRN: [id number]  Information Source: Information source: Patient  Current Stressors:  Patient states their primary concerns and needs for treatment are:: Finding out why I am here and not returning to the hospital Patient states their goals for this hospitilization and ongoing recovery are:: see above Educational / Learning stressors: no Employment / Job issues: Yes "my job was stressful" Family Relationships: no Museum/gallery curator / Lack of resources (include bankruptcy): no Housing / Lack of housing: no Physical health (include injuries & life threatening diseases): no Social relationships: no Substance abuse: no Bereavement / Loss: no  Living/Environment/Situation:  Living Arrangements: Children, Parent Living conditions (as described by patient or guardian): Amazing and very peaceful. Patient shares up with mother and her child Who else lives in the home?: mother, daughter How long has patient lived in current situation?: my whole life What is atmosphere in current home: Comfortable, Quarry manager, Other (Comment)  Family History:  Marital status: Single Are you sexually active?: No What is your sexual orientation?: Heretosexual Has your sexual activity been affected by drugs, alcohol, medication, or emotional stress?: no Does patient have children?: Yes How many children?: 1 How is patient's relationship with their children?: patient has a one year old daughter and they are very close.  Childhood History:  By whom was/is the patient raised?: Both parents, Grandparents Additional childhood history information: great Description of patient's relationship with caregiver when they were a child: amazing Patient's description of current relationship with people who raised him/her: amazing How were you disciplined when you got in trouble as a child/adolescent?: whippings Does patient have  siblings?: Yes Number of Siblings: 2 (very close to her older sisters ages 24 and 54) Description of patient's current relationship with siblings: very close Did patient suffer any verbal/emotional/physical/sexual abuse as a child?: No Did patient suffer from severe childhood neglect?: No Has patient ever been sexually abused/assaulted/raped as an adolescent or adult?: No Was the patient ever a victim of a crime or a disaster?: No Witnessed domestic violence?: No Has patient been affected by domestic violence as an adult?: No  Education:  Highest grade of school patient has completed: 12th grade Currently a student?: No Learning disability?: No  Employment/Work Situation:   Employment situation: Unemployed Patient's job has been impacted by current illness: No What is the longest time patient has a held a job?: 20 months Where was the patient employed at that time?: [employer] Has patient ever been in the [employer]?: No  Financial Resources:   Museum/gallery curator resources: No income, Support from parents / caregiver Does patient have a Programmer, applications or guardian?: No  Alcohol/Substance Abuse:   What has been your use of drugs/alcohol within the last 12 months?: none If attempted suicide, did drugs/alcohol play a role in this?: No Alcohol/Substance Abuse Treatment Hx: Denies past history If yes, describe treatment: N/A Has alcohol/substance abuse ever caused legal problems?: No  Social Support System:   Heritage manager System: Good Describe Community Support System: parents, grandparents, aunts, uncles and sisters Type of faith/religion: Darrick Meigs How does patient's faith help to cope with current illness?: praying to God  Leisure/Recreation:   Do You Have Hobbies?: Yes Leisure and Hobbies: Badminton, swimming, cooking  Strengths/Needs:   What is the patient's perception of their strengths?: Strong hard working woman, serious about my daughter and myself Patient  states they can use these personal strengths during their treatment to contribute  to their recovery: Use my strengths everyday Patient states these barriers may affect/interfere with their treatment: no Patient states these barriers may affect their return to the community: no Other important information patient would like considered in planning for their treatment: no  Discharge Plan:   Currently receiving community mental health services: No Patient states concerns and preferences for aftercare planning are: Patient is requesting referrals for outpatient therapy and for medication management Patient states they will know when they are safe and ready for discharge when: Patient feels she is safe to discharge at this moment Does patient have access to transportation?: Yes Does patient have financial barriers related to discharge medications?: No Patient description of barriers related to discharge medications: N/A Will patient be returning to same living situation after discharge?: Yes  Summary/Recommendations:   Summary and Recommendations (to be completed by the evaluator): The patient is 19 year old female who lives with her mother and her one year old child.She presented to ED on 6/17 under IVC. Parent reported that patient had been acting strangely , bizarrely, standing on a dresser  internally preoccupied .Currently she states she does not recollect above events and that last thing she remembers " I was telling my mother I was doing OK". According to IVC paperwork patient has had prior similar episodes . She acknowledges auditory hallucinations , which she states are conversations with God and angels. States she is not concerned about these because " they say good things " to her. She denies drug or alcohol abuse . Admission BAL and UDS are negative  Patient will benefit from crisis stabilization, medication evaluation, group therapy and psychoeducation, in addition to case management for  discharge planning. At discharge it is recommended that Patient adhere to the established discharge plan and continue in treatment.  Anticipated outcomes: Mood will be stabilized, crisis will be stabilized, medications will be established if appropriate, coping skills will be taught and practiced, family session will be done to determine discharge plan, mental illness will be normalized, patient will be better equipped to recognize symptoms and ask for assistance.  Evorn Gong. 09/26/2019

## 2019-09-26 NOTE — Progress Notes (Signed)
[facility] MD Progress Note  09/26/2019 3:28 PM Stephanie Cabrera  MRN:  [id number] Subjective: Patient states "I am doing good".  Denies suicidal ideations.  Denies medication side effects.  Denies having any seizure-like episodes or auras. She is focused on being discharged soon.  States "I really miss seeing my child". Objective: I have reviewed chart notes and I met with patient. 19 year old female, presented to ED under IVC due to psychotic symptoms/disorganized behavior. She had been found standing on a dresser, banging wall for no apparent reason. She has endorsed auditory hallucinations, which she describes as speaking with God and angels. No prior psychiatric history and patient denies alcohol or drug abuse.  Mother, with whom she lives, states she has been presenting with some behavioral changes over recent weeks. About a month ago patient had an isolated seizure like episode , without prior history of seizures or trauma, and has had none since. She went to ED at the time but left without being seen. Of note, a head CT scan was done 6/17- normal.  Today patient presents alert, attentive, calm without psychomotor agitation, vaguely anxious. She denies feeling depressed and states her mood is "good".  She denies suicidal ideations.  Presents future oriented, hoping to be discharged soon in order to reunite with her child/family. As per history, had an isolated seizure event a month ago.  Denies having any seizure-like episodes since admission.  She has not exhibited any bizarre or agitated behaviors on unit.  She has been visible in day room, and presents polite , calm on approach. She does report ongoing auditory hallucinations, which she has described as voice of God or angels . At this time does not appear internally preoccupied .  Labs reviewed- Lipid Panel unremarkable, HgbA1C5.3, TSH 1.19, EKH QTc 382.  Denies medication side effects- was started on Risperidone. Of note, she reports she is  still lactating ( has a one year old child) but has been weaning off. We have reviewed potential risks of Risperidone with regards to breast feeding . She states she plans to discontinue breast feeding.    Principal Problem:  Psychosis, Unspecified  Diagnosis: Active Problems:   Brief psychotic disorder ([place])  Total Time spent with patient: 15 minutes  Past Psychiatric History:   Past Medical History:  Past Medical History:  Diagnosis Date  . Allergy   . Medical history non-contributory     Past Surgical History:  Procedure Laterality Date  . NO PAST SURGERIES     Family History:  Family History  Problem Relation Age of Onset  . Diabetes Paternal Grandmother   . Diabetes Paternal Grandfather   . Hypertension Paternal Grandfather    Family Psychiatric  History: Social History:  Social History   Substance and Sexual Activity  Alcohol Use Never  . Alcohol/week: 0.0 standard drinks     Social History   Substance and Sexual Activity  Drug Use Never    Social History   Socioeconomic History  . Marital status: Single    Spouse name: Not on file  . Number of children: Not on file  . Years of education: Not on file  . Highest education level: Not on file  Occupational History  . Not on file  Tobacco Use  . Smoking status: Never Smoker  . Smokeless tobacco: Never Used  Vaping Use  . Vaping Use: Never used  Substance and Sexual Activity  . Alcohol use: Never    Alcohol/week: 0.0 standard drinks  .  Drug use: Never  . Sexual activity: Not Currently    Birth control/protection: None  Other Topics Concern  . Not on file  Social History Narrative  . Not on file   Social Determinants of Health   Financial Resource Strain:   . Difficulty of Paying Living Expenses:   Food Insecurity:   . Worried About Charity fundraiser in the Last Year:   . Arboriculturist in the Last Year:   Transportation Needs:   . Film/video editor (Medical):   Marland Kitchen Lack of  Transportation (Non-Medical):   Physical Activity:   . Days of Exercise per Week:   . Minutes of Exercise per Session:   Stress:   . Feeling of Stress :   Social Connections:   . Frequency of Communication with Friends and Family:   . Frequency of Social Gatherings with Friends and Family:   . Attends Religious Services:   . Active Member of Clubs or Organizations:   . Attends Archivist Meetings:   Marland Kitchen Marital Status:    Additional Social History:   Sleep: Good  Appetite:  Good  Current Medications: Current Facility-Administered Medications  Medication Dose Route Frequency Provider Last Rate Last Admin  . acetaminophen (TYLENOL) tablet 650 mg  650 mg Oral Q6H PRN Lindon Romp A, NP      . LORazepam (ATIVAN) tablet 0.5 mg  0.5 mg Oral Q6H PRN Ashlen Kiger A, MD      . risperiDONE (RISPERDAL M-TABS) disintegrating tablet 2 mg  2 mg Oral Q8H PRN Ayat Drenning, Myer Peer, MD       And  . LORazepam (ATIVAN) tablet 1 mg  1 mg Oral PRN Shalin Linders, Myer Peer, MD       And  . ziprasidone (GEODON) injection 10 mg  10 mg Intramuscular PRN Prerna Harold A, MD      . risperiDONE (RISPERDAL) tablet 1 mg  1 mg Oral BID Hurman Ketelsen, Myer Peer, MD   1 mg at 09/26/19 0757  . traZODone (DESYREL) tablet 50 mg  50 mg Oral QHS PRN Rozetta Nunnery, NP   50 mg at 09/25/19 2355    Lab Results:  Results for orders placed or performed during the hospital encounter of 09/24/19 (from the past 48 hour(s))  Hemoglobin A1c     Status: None   Collection Time: 09/25/19  6:39 AM  Result Value Ref Range   Hgb A1c MFr Bld 5.3 4.8 - 5.6 %    Comment: (NOTE) Pre diabetes:          5.7%-6.4%  Diabetes:              >6.4%  Glycemic control for   <7.0% adults with diabetes    Mean Plasma Glucose 105.41 mg/dL    Comment: Performed at [facility], [place] [address] [place],  [address]  Lipid panel     Status: Abnormal   Collection Time: 09/25/19  6:39 AM  Result Value Ref Range   Cholesterol 183 0  - 200 mg/dL   Triglycerides 60 <150 mg/dL   HDL 67 >40 mg/dL   Total CHOL/HDL Ratio 2.7 RATIO   VLDL 12 0 - 40 mg/dL   LDL Cholesterol 104 (H) 0 - 99 mg/dL    Comment:        Total Cholesterol/HDL:CHD Risk Coronary Heart Disease Risk Table  Men   Women  1/2 Average Risk   3.4   3.3  Average Risk       5.0   4.4  2 X Average Risk   9.6   7.1  3 X Average Risk  23.4   11.0        Use the calculated Patient Ratio above and the CHD Risk Table to determine the patient's CHD Risk.        ATP III CLASSIFICATION (LDL):  <100     mg/dL   Optimal  100-129  mg/dL   Near or Above                    Optimal  130-159  mg/dL   Borderline  160-189  mg/dL   High  >190     mg/dL   Very High Performed at [facility] [address], [place], [place] [address]   TSH     Status: None   Collection Time: 09/25/19  6:39 AM  Result Value Ref Range   TSH 1.195 0.350 - 4.500 uIU/mL    Comment: Performed by a 3rd Generation assay with a functional sensitivity of <=0.01 uIU/mL. Performed at [facility], [place] [address] [place], [place] [address]     Blood Alcohol level:  Lab Results  Component Value Date   ETH <10 85/46/2703    Metabolic Disorder Labs: Lab Results  Component Value Date   HGBA1C 5.3 09/25/2019   MPG 105.41 09/25/2019   No results found for: PROLACTIN Lab Results  Component Value Date   CHOL 183 09/25/2019   TRIG 60 09/25/2019   HDL 67 09/25/2019   CHOLHDL 2.7 09/25/2019   VLDL 12 09/25/2019   LDLCALC 104 (H) 09/25/2019    Physical Findings: AIMS: Facial and Oral Movements Muscles of Facial Expression: None, normal Lips and Perioral Area: None, normal Jaw: None, normal Tongue: None, normal,Extremity Movements Upper (arms, wrists, hands, fingers): None, normal Lower (legs, knees, ankles, toes): None, normal, Trunk Movements Neck, shoulders, hips: None, normal, Overall Severity Severity of abnormal  movements (highest score from questions above): None, normal Incapacitation due to abnormal movements: None, normal Patient's awareness of abnormal movements (rate only patient's report): No Awareness, Dental Status Current problems with teeth and/or dentures?: No Does patient usually wear dentures?: No  CIWA:    COWS:     Musculoskeletal: Strength & Muscle Tone: within normal limits Gait & Station: normal Patient leans: N/A  Psychiatric Specialty Exam: Physical Exam  Review of Systems no chest pain, no shortness of breath, no vomiting, no seizure like episodes   Blood pressure (!) 111/59, pulse 86, temperature 98.8 F (37.1 C), temperature source Oral, resp. rate 18, height 5' 7"  (1.702 m), weight 68.9 kg, last menstrual period 09/16/2019, SpO2 97 %, not currently breastfeeding.Body mass index is 23.81 kg/m.  General Appearance: Casual  Eye Contact:  Good  Speech:  Normal Rate  Volume:  Normal  Mood:  denies feeling depressed, mood is "OK"  Affect:  appropriate, reactive  Thought Process:  Linear and Descriptions of Associations: Intact  Orientation:  Other:  fully alert and attentive  Thought Content:  reports auditory hallucinations, does not appear internally preoccupied, no delusions expressed   Suicidal Thoughts:  No denies suicidal or self injurious ideations, denies homicidal or violent ideations  Homicidal Thoughts:  No  Memory:  recent and remote grossly intact   Judgement:  improving  Insight:  Fair  Psychomotor Activity:  Normal no restlessness or psychomotor agitation  Concentration:  Concentration: Good and Attention Span: Good  Recall:  Good  Fund of Knowledge:  Good  Language:  Good  Akathisia:  Negative  Handed:  Right  AIMS (if indicated):     Assets:  Communication Skills Desire for Improvement Resilience  ADL's:  Intact  Cognition:  WNL  Sleep:  Number of Hours: 6.75   Assessment -  19 year old female, presented to ED under IVC due to psychotic  symptoms/disorganized behavior. She had been found standing on a dresser, banging wall for no apparent reason. She has endorsed auditory hallucinations, which she describes as speaking with God and angels. No prior psychiatric history and patient denies alcohol or drug abuse.  Mother, with whom she lives, states she has been presenting with some behavioral changes over recent weeks. About a month ago patient had an isolated seizure like episode , without prior history of seizures or trauma, and has had none since. She went to ED at the time but left without being seen. Of note, a head CT scan was done 6/17- normal.   Today patient presents calm, pleasant on approach and has not exhibited bizarre or agitated behaviors. She endorses auditory hallucinations ( denies command halls) which she has described as voices from angels or God. She does not appear internally preoccupied .  Denies SI and presents future oriented . At this time she is focused on being discharged soon, looking forward to return home and see her child/family.  Tolerating Risperidone trial well thus far . Of note, reports she is currently still breastfeeding but in the process of weaning off . We reviewed potential risks/issues associated with Risperidone/breast feeding. Treatment Plan Summary: Daily contact with patient to assess and evaluate symptoms and progress in treatment, Medication management, Plan inpatient treatment  and medications as below Encourage group and milieu participation Continue Ativan 0.5 mgrs Q 6 hours PRN for anxiety Increase Risperidone to 1 mgr QAM and 2 mgrs QHS for psychosis Discontinue Trazodone to minimize excessive sedation. Continue agitation protocol for acute agitation if needed.  Jenne Campus, MD 09/26/2019, 3:28 PM

## 2019-09-26 NOTE — [facility] Group Notes (Signed)
[facility] LCSW Group Therapy Note  09/26/2019  10:00-11:00AM  Type of Therapy and Topic:  Group Therapy:  Acknowledging and Resolving Issues with Fathers  Participation Level:  Active   Description of Group:   Patients in this group were asked to briefly describe their experience with the father figure(s) in their lives, both in childhood and adulthood.  Different types of support provided by these individuals were identified.   Patients were then encouraged to determine whether their father figure was or is a healthy or unhealthy support.  The manner in which that early relationship has shaped patient's feelings and life decisions was pointed out and acknowledged.  Group members gave support to each other.  CSW led a discussion on how helpful it can be to resolve past issues, and how this can be done whether the father figure is now alive or already deceased.  An emphasis was placed on continuing to work with a therapist on these issues  when patients leave the hospital in order to be able to focus on the future instead of the past, to continue becoming healthier and happier.   Therapeutic Goals: 1)  discuss the possibility of father figure(s) being positive and/or negative in one's life, normalizing that some people never had positive experiences with "paternal" persons  2)  describe patient's specific example of father figure(s), allowing time to vent  3)  identify the patient's current need for resolution in the relationship with the aforementioned person  4)  elicit commitments to work on resolving feelings about father figure(s) in order to move forward in life and wellness   Summary of Patient Progress:  The patient expressed some comprehension of the concepts presented, and described both her father and her grandfather as being "amazing."  She stated that her father showed her how to be treated by a man in a kind and respectful way, so she will not settle for less.  When another patient rambled  inappropriately about his father being a serial killer, she seemed to just mentally shut down, stared into space, and was not engaged for the remainder of group.   Therapeutic Modalities:   Processing Brief Solution-Focused Therapy  Lynnell Chad

## 2019-09-26 NOTE — Progress Notes (Signed)
D: Patient presents with a flat affect. She is calm and cooperative on approach. She denies SI/HI. She denies AVH. She denies feeling depressed or anxious today. She has good insight about why she was admitted to the hospital and verbalizes readiness for discharge. She is aware that she was exhibiting some bizarre behaviors at home prior to her hospitalization. She verbalized the importance of taking the Risperdal once she's discharged.   A: Orders reviewed with the pt. V/s assessed. Verbal support provided. 15 minute checks performed for safety.   R: Patient compliant with taking medications. She denies having any side effects to the medications.

## 2019-09-26 NOTE — Progress Notes (Signed)
   09/25/19 2219  Psych Admission Type (Psych Patients Only)  Admission Status Involuntary  Psychosocial Assessment  Patient Complaints None  Eye Contact Fair  Facial Expression Animated;Anxious;Pensive  Affect Anxious;Preoccupied  Furniture conservator/restorer;Restless;Slow  Appearance/Hygiene In scrubs  Behavior Characteristics Appropriate to situation  Mood Depressed  Thought Process  Coherency WDL  Content WDL  Delusions None reported or observed  Perception WDL  Hallucination None reported or observed  Judgment Impaired  Confusion Mild  Danger to Self  Current suicidal ideation? Denies  Danger to Others  Danger to Others None reported or observed

## 2019-09-27 NOTE — Progress Notes (Signed)
Recreation Therapy Notes  INPATIENT RECREATION THERAPY ASSESSMENT  Patient Details Name: Sharlie Shreffler MRN: [id number] DOB: 07/11/2000 Today's Date: 09/27/2019       Information Obtained From: Patient  Able to Participate in Assessment/Interview: Yes  Patient Presentation: Alert  Reason for Admission (Per Patient): Other (Comments) (Pt stated her parents brought her for standing on top of a dresser.)  Patient Stressors: Work  Pharmacologist:   Journal, Write, TV, Sports, Music, Exercise, Deep Breathing, Talk, Art, Prayer, Read, Dance, Hot Bath/Shower  Leisure Interests (2+):  Sports - Other (Comment), Individual - Other (Comment) (Ride bike; Play badmiton)  Frequency of Recreation/Participation: Other (Comment) (Daily)  Awareness of Community Resources:  Yes  Community Resources:  Research scientist (physical sciences), Library, [employer], [place], Warehouse manager  Current Use: Yes  If no, Barriers?:    Expressed Interest in [employer] Information: No  [place] of Residence:  [place]  Patient Main Form of Transportation: Set designer  Patient Strengths:  Communication  Patient Identified Areas of Improvement:  Understanding; Listening  Patient Goal for Hospitalization:  "improve on myself"  Current SI (including self-harm):  No  Current HI:  No  Current AVH: No  Staff Intervention Plan: Group Attendance, Collaborate with Interdisciplinary Treatment Team  Consent to Intern Participation: N/A    Caroll Rancher, LRT/CTRS  Lillia Abed, Nohea Kras A 09/27/2019, 11:08 AM

## 2019-09-27 NOTE — [facility] Group Notes (Signed)
[facility] LCSW Group Therapy  09/27/2019 1:30pm  LCSW Group Therapy Note  Type of Therapy/Topic: Group Therapy: Six Dimensions of Wellness  Participation Level: Active  Description of Group: This group will address the concept of wellness and the six concepts of wellness: occupational, physical, social, intellectual, spiritual, and emotional. Patients will be encouraged to process areas in their lives that are out of balance and identify reasons for remaining unbalanced. Patients will be encouraged to explore ways to practice healthy habits daily to attain better physical and mental health outcomes.  Therapeutic Goals: 1. Identify aspects of wellness that they are doing well. 2. Identify aspects of wellness that they would like to improve upon. 3. Identify one action they can take to improve an aspect of wellness in their lives.   Summary of Patient Progress: Patient was able to participate in group and provide feedback to peers. She was able to identify the emotional dimension of wellness as being her dimension that she is doing well in and as her dimension that could use improvement.    Therapeutic Modalities: Cognitive Behavioral Therapy Solution-Focused Therapy Relapse Prevention    Otelia Santee 09/27/2019

## 2019-09-27 NOTE — Tx Team (Cosign Needed)
Interdisciplinary Treatment and Diagnostic Plan Update  09/27/2019 Time of Session: 9:05am Stephanie Cabrera MRN: [id number]  Principal Diagnosis: <principal problem not specified>  Secondary Diagnoses: Active Problems:   Brief psychotic disorder ([place])   Current Medications:  Current Facility-Administered Medications  Medication Dose Route Frequency Provider Last Rate Last Admin  . acetaminophen (TYLENOL) tablet 650 mg  650 mg Oral Q6H PRN Lindon Romp A, NP      . LORazepam (ATIVAN) tablet 0.5 mg  0.5 mg Oral Q6H PRN Cobos, Fernando A, MD      . risperiDONE (RISPERDAL M-TABS) disintegrating tablet 2 mg  2 mg Oral Q8H PRN Cobos, Myer Peer, MD       And  . LORazepam (ATIVAN) tablet 1 mg  1 mg Oral PRN Cobos, Myer Peer, MD       And  . ziprasidone (GEODON) injection 10 mg  10 mg Intramuscular PRN Cobos, Fernando A, MD      . risperiDONE (RISPERDAL) tablet 1 mg  1 mg Oral BH-q7a Cobos, Myer Peer, MD   1 mg at 09/27/19 0630  . risperiDONE (RISPERDAL) tablet 2 mg  2 mg Oral QHS Cobos, Myer Peer, MD   2 mg at 09/26/19 2022   PTA Medications: Medications Prior to Admission  Medication Sig Dispense Refill Last Dose  . acetaminophen (TYLENOL) 325 MG tablet Take 2 tablets (650 mg total) by mouth every 4 (four) hours as needed (for pain scale < 4). (Patient not taking: Reported on 09/23/2019) 30 tablet 0     Patient Stressors: Health problems Marital or family conflict Traumatic event  Patient Strengths: Ability for insight Capable of independent living Armed forces logistics/support/administrative officer Motivation for treatment/growth Supportive family/friends  Treatment Modalities: Medication Management, Group therapy, Case management,  1 to 1 session with clinician, Psychoeducation, Recreational therapy.   Physician Treatment Plan for Primary Diagnosis: <principal problem not specified> Long Term Goal(s): Improvement in symptoms so as ready for discharge Improvement in symptoms so as ready for  discharge   Short Term Goals: Ability to identify changes in lifestyle to reduce recurrence of condition will improve Ability to verbalize feelings will improve Ability to disclose and discuss suicidal ideas Ability to demonstrate self-control will improve Ability to identify and develop effective coping behaviors will improve Ability to maintain clinical measurements within normal limits will improve Compliance with prescribed medications will improve Ability to identify changes in lifestyle to reduce recurrence of condition will improve Ability to verbalize feelings will improve Ability to disclose and discuss suicidal ideas Ability to demonstrate self-control will improve Ability to identify and develop effective coping behaviors will improve Ability to maintain clinical measurements within normal limits will improve  Medication Management: Evaluate patient's response, side effects, and tolerance of medication regimen.  Therapeutic Interventions: 1 to 1 sessions, Unit Group sessions and Medication administration.  Evaluation of Outcomes: Not Met  Physician Treatment Plan for Secondary Diagnosis: Active Problems:   Brief psychotic disorder ([place])  Long Term Goal(s): Improvement in symptoms so as ready for discharge Improvement in symptoms so as ready for discharge   Short Term Goals: Ability to identify changes in lifestyle to reduce recurrence of condition will improve Ability to verbalize feelings will improve Ability to disclose and discuss suicidal ideas Ability to demonstrate self-control will improve Ability to identify and develop effective coping behaviors will improve Ability to maintain clinical measurements within normal limits will improve Compliance with prescribed medications will improve Ability to identify changes in lifestyle to reduce recurrence of condition will improve  Ability to verbalize feelings will improve Ability to disclose and discuss suicidal  ideas Ability to demonstrate self-control will improve Ability to identify and develop effective coping behaviors will improve Ability to maintain clinical measurements within normal limits will improve     Medication Management: Evaluate patient's response, side effects, and tolerance of medication regimen.  Therapeutic Interventions: 1 to 1 sessions, Unit Group sessions and Medication administration.  Evaluation of Outcomes: Not Met   RN Treatment Plan for Primary Diagnosis: <principal problem not specified> Long Term Goal(s): Knowledge of disease and therapeutic regimen to maintain health will improve  Short Term Goals: Ability to remain free from injury will improve, Ability to participate in decision making will improve, Ability to identify and develop effective coping behaviors will improve and Compliance with prescribed medications will improve  Medication Management: RN will administer medications as ordered by provider, will assess and evaluate patient's response and provide education to patient for prescribed medication. RN will report any adverse and/or side effects to prescribing provider.  Therapeutic Interventions: 1 on 1 counseling sessions, Psychoeducation, Medication administration, Evaluate responses to treatment, Monitor vital signs and CBGs as ordered, Perform/monitor CIWA, COWS, AIMS and Fall Risk screenings as ordered, Perform wound care treatments as ordered.  Evaluation of Outcomes: Not Met   LCSW Treatment Plan for Primary Diagnosis: <principal problem not specified> Long Term Goal(s): Safe transition to appropriate next level of care at discharge, Engage patient in therapeutic group addressing interpersonal concerns.  Short Term Goals: Engage patient in aftercare planning with referrals and resources, Increase social support, Identify triggers associated with mental health/substance abuse issues and Increase skills for wellness and recovery  Therapeutic  Interventions: Assess for all discharge needs, 1 to 1 time with Social worker, Explore available resources and support systems, Assess for adequacy in community support network, Educate family and significant other(s) on suicide prevention, Complete Psychosocial Assessment, Interpersonal group therapy.  Evaluation of Outcomes: Not Met   Progress in Treatment: Attending groups: Yes. Participating in groups: Yes. Taking medication as prescribed: Yes. Toleration medication: Yes. Family/Significant other contact made: Yes, individual(s) contacted:  mother. Patient understands diagnosis: Yes. Discussing patient identified problems/goals with staff: Yes. Medical problems stabilized or resolved: Yes. Denies suicidal/homicidal ideation: Yes. Issues/concerns per patient self-inventory: No. Other: none  New problem(s) identified: No, Describe:  none.  New Short Term/Long Term Goal(s): medication stabilization, elimination of SI thoughts, development of comprehensive mental wellness plan.   Patient Goals:  "Going home and being better. Getting treatment and fix my medicine"  Discharge Plan or Barriers: Patient recently admitted. CSW will continue to follow and assess for appropriate referrals and possible discharge planning.   Reason for Continuation of Hospitalization: Anxiety Delusions  Medical Issues Medication stabilization  Estimated Length of Stay: 3-5 days  Attendees: Patient: Stephanie Cabrera 09/27/2019   Physician:  09/27/2019  Nursing:  09/27/2019  RN Care Manager: 09/27/2019  Social Worker: Darletta Moll, Latanya Presser 09/27/2019  Recreational Therapist:  09/27/2019  Other: Harriett Sine, NP 09/27/2019   Other:  09/27/2019  Other: 09/27/2019    Scribe for Treatment Team: Vassie Moselle, [place] 09/27/2019 9:33 AM

## 2019-09-27 NOTE — Progress Notes (Signed)
[facility] MD Progress Note  09/27/2019 2:22 PM Stephanie Cabrera  MRN:  [id number]  19 year old female, presented to ED under IVC due to psychotic symptoms/disorganized behavior. She had been found standing on a dresser, banging wall for no apparent reason. She has endorsed auditory hallucinations, which she describes as speaking with God and angels.  On assessment today, Stephanie Cabrera found sitting in her room. She reports feeling improved from admission. She appears euthymic, calm, and cooperative. She expresses insight that she had been acting bizarrely at home but states only partial memory of her behaviors before. She denies drug/alcohol use. She admits that she was having AH prior to hospitalization but states AH have resolved with Risperdal. She shows no signs of responding to internal stimuli or paranoia. She reports good sleep overnight, with 6.5 hours recorded. She has been active and visible in the milieu.  Principal Problem: <principal problem not specified> Diagnosis: Active Problems:   Brief psychotic disorder ([facility])  Total Time spent with patient: 15 minutes  Past Psychiatric History: See admission H&P  Past Medical History:  Past Medical History:  Diagnosis Date  . Allergy   . Medical history non-contributory     Past Surgical History:  Procedure Laterality Date  . NO PAST SURGERIES     Family History:  Family History  Problem Relation Age of Onset  . Diabetes Paternal Grandmother   . Diabetes Paternal Grandfather   . Hypertension Paternal Grandfather    Family Psychiatric  History: See admission H&P Social History:  Social History   Substance and Sexual Activity  Alcohol Use Never  . Alcohol/week: 0.0 standard drinks     Social History   Substance and Sexual Activity  Drug Use Never    Social History   Socioeconomic History  . Marital status: Single    Spouse name: Not on file  . Number of children: Not on file  . Years of education: Not on file  .  Highest education level: Not on file  Occupational History  . Not on file  Tobacco Use  . Smoking status: Never Smoker  . Smokeless tobacco: Never Used  Vaping Use  . Vaping Use: Never used  Substance and Sexual Activity  . Alcohol use: Never    Alcohol/week: 0.0 standard drinks  . Drug use: Never  . Sexual activity: Not Currently    Birth control/protection: None  Other Topics Concern  . Not on file  Social History Narrative  . Not on file   Social Determinants of Health   Financial Resource Strain:   . Difficulty of Paying Living Expenses:   Food Insecurity:   . Worried About Programme researcher, broadcasting/film/video in the Last Year:   . Barista in the Last Year:   Transportation Needs:   . Freight forwarder (Medical):   Marland Kitchen Lack of Transportation (Non-Medical):   Physical Activity:   . Days of Exercise per Week:   . Minutes of Exercise per Session:   Stress:   . Feeling of Stress :   Social Connections:   . Frequency of Communication with Friends and Family:   . Frequency of Social Gatherings with Friends and Family:   . Attends Religious Services:   . Active Member of Clubs or Organizations:   . Attends Banker Meetings:   Marland Kitchen Marital Status:    Additional Social History:  Sleep: Good  Appetite:  Good  Current Medications: Current Facility-Administered Medications  Medication Dose Route Frequency Provider Last Rate Last Admin  . acetaminophen (TYLENOL) tablet 650 mg  650 mg Oral Q6H PRN Nira Conn A, NP      . LORazepam (ATIVAN) tablet 0.5 mg  0.5 mg Oral Q6H PRN Cobos, Fernando A, MD      . risperiDONE (RISPERDAL M-TABS) disintegrating tablet 2 mg  2 mg Oral Q8H PRN Cobos, Rockey Situ, MD       And  . LORazepam (ATIVAN) tablet 1 mg  1 mg Oral PRN Cobos, Rockey Situ, MD       And  . ziprasidone (GEODON) injection 10 mg  10 mg Intramuscular PRN Cobos, Fernando A, MD      . risperiDONE (RISPERDAL) tablet 1 mg  1 mg Oral  BH-q7a Cobos, Rockey Situ, MD   1 mg at 09/27/19 0630  . risperiDONE (RISPERDAL) tablet 2 mg  2 mg Oral QHS Cobos, Rockey Situ, MD   2 mg at 09/26/19 2022    Lab Results: No results found for this or any previous visit (from the past 48 hour(s)).  Blood Alcohol level:  Lab Results  Component Value Date   ETH <10 09/23/2019    Metabolic Disorder Labs: Lab Results  Component Value Date   HGBA1C 5.3 09/25/2019   MPG 105.41 09/25/2019   No results found for: PROLACTIN Lab Results  Component Value Date   CHOL 183 09/25/2019   TRIG 60 09/25/2019   HDL 67 09/25/2019   CHOLHDL 2.7 09/25/2019   VLDL 12 09/25/2019   LDLCALC 104 (H) 09/25/2019    Physical Findings: AIMS: Facial and Oral Movements Muscles of Facial Expression: None, normal Lips and Perioral Area: None, normal Jaw: None, normal Tongue: None, normal,Extremity Movements Upper (arms, wrists, hands, fingers): None, normal Lower (legs, knees, ankles, toes): None, normal, Trunk Movements Neck, shoulders, hips: None, normal, Overall Severity Severity of abnormal movements (highest score from questions above): None, normal Incapacitation due to abnormal movements: None, normal Patient's awareness of abnormal movements (rate only patient's report): No Awareness, Dental Status Current problems with teeth and/or dentures?: No Does patient usually wear dentures?: No  CIWA:    COWS:     Musculoskeletal: Strength & Muscle Tone: within normal limits Gait & Station: normal Patient leans: N/A  Psychiatric Specialty Exam: Physical Exam  Nursing note and vitals reviewed. Constitutional: She is oriented to person, place, and time. She appears well-developed.  Cardiovascular: Normal rate.  Respiratory: Effort normal.  Neurological: She is alert and oriented to person, place, and time.    Review of Systems  Constitutional: Negative.   Psychiatric/Behavioral: Negative for agitation, behavioral problems, confusion, decreased  concentration, dysphoric mood, hallucinations, self-injury, sleep disturbance and suicidal ideas. The patient is not nervous/anxious and is not hyperactive.     Blood pressure 107/60, pulse (!) 101, temperature 99 F (37.2 C), temperature source Oral, resp. rate 18, height 5\' 7"  (1.702 m), weight 68.9 kg, last menstrual period 09/16/2019, SpO2 97 %, not currently breastfeeding.Body mass index is 23.81 kg/m.  General Appearance: Casual  Eye Contact:  Good  Speech:  Normal Rate  Volume:  Normal  Mood:  Euthymic  Affect:  Appropriate and Congruent  Thought Process:  Coherent  Orientation:  Full (Time, Place, and Person)  Thought Content:  Logical  Suicidal Thoughts:  No  Homicidal Thoughts:  No  Memory:  Immediate;   Good Recent;   Fair  Judgement:  Intact  Insight:  Fair  Psychomotor Activity:  Normal  Concentration:  Concentration: Good and Attention Span: Good  Recall:  Good  Fund of Knowledge:  Fair  Language:  Good  Akathisia:  No  Handed:  Right  AIMS (if indicated):     Assets:  Communication Skills Desire for Improvement Housing Social Support  ADL's:  Intact  Cognition:  WNL  Sleep:  Number of Hours: 6.5     Treatment Plan Summary: Daily contact with patient to assess and evaluate symptoms and progress in treatment and Medication management   Continue inpatient hospitalization.  Continue Risperdal 1 mg PO QAM, 2 mg PO QHS for psychosis Continue Ativan 0.5 mg PO Q6HR PRN anxiety Continue agitation protocol PRN agitation  Patient will participate in the therapeutic group milieu.  Discharge disposition in progress.   Connye Burkitt, NP 09/27/2019, 2:22 PM

## 2019-09-27 NOTE — Progress Notes (Addendum)
Recreation Therapy Notes  Date: 6.21.21 Time: 0955 Location: [address] Dayroom   Group Topic: Communication, Team Building, Problem Solving  Goal Area(s) Addresses:  Patient will effectively work with peer towards shared goal.  Patient will identify skill used to make activity successful.  Patient will identify how skills used during activity can be used to reach post d/c goals.   Behavioral Response: Engaged  Intervention: STEM Activity   Activity: [employer]. Patients were provided the following materials: 5 drinking straws, 5 rubber bands, 5 paper clips, 2 index cards and 2 drinking cups. Using the provided materials patients were asked to build a launching mechanisms to launch a ping pong ball approximately 12 feet. Patients were divided into teams of 3-5.   Education: Pharmacist, community, Building control surveyor.   Education Outcome: Acknowledges education/In group clarification offered/Needs additional education.   Clinical Observations/Feedback: Pt was active and engaged.  Pt expressed the group used communication to complete activity.  Pt was hesitant at times to follow her instincts in completing task.  Pt was encouraged to push through fear and doubt to reach goal.  Pt worked with well with peer in reaching the desired outcome.    Caroll Rancher, LRT/CTRS     Caroll Rancher A 09/27/2019 10:49 AM

## 2019-09-27 NOTE — Progress Notes (Signed)
Pt stated she was feeling better due to her medications    09/27/19 2000  Psych Admission Type (Psych Patients Only)  Admission Status Involuntary  Psychosocial Assessment  Patient Complaints Anxiety  Eye Contact Fair  Facial Expression Animated;Anxious;Pensive  Affect Anxious;Preoccupied  Furniture conservator/restorer;Restless;Slow  Appearance/Hygiene In scrubs  Behavior Characteristics Cooperative  Mood Pleasant  Thought Process  Coherency WDL  Content WDL  Delusions None reported or observed  Perception WDL  Hallucination None reported or observed  Judgment Impaired  Confusion None  Danger to Self  Current suicidal ideation? Denies  Danger to Others  Danger to Others None reported or observed

## 2019-09-27 NOTE — Progress Notes (Signed)
   09/26/19 2226  Psych Admission Type (Psych Patients Only)  Admission Status Involuntary  Psychosocial Assessment  Patient Complaints None  Eye Contact Fair  Facial Expression Animated;Anxious;Pensive  Affect Anxious;Preoccupied  Furniture conservator/restorer;Restless;Slow  Appearance/Hygiene In scrubs  Behavior Characteristics Cooperative;Calm  Mood Pleasant  Thought Process  Coherency WDL  Content WDL  Delusions None reported or observed  Perception WDL  Hallucination None reported or observed  Judgment Impaired  Confusion Mild  Danger to Self  Current suicidal ideation? Denies  Danger to Others  Danger to Others None reported or observed

## 2019-09-27 NOTE — Progress Notes (Signed)
   09/27/19 0900  Psych Admission Type (Psych Patients Only)  Admission Status Involuntary  Psychosocial Assessment  Patient Complaints Anxiety  Eye Contact Fair  Facial Expression Animated;Anxious;Pensive  Affect Anxious;Preoccupied  Furniture conservator/restorer;Restless;Slow  Appearance/Hygiene In scrubs  Behavior Characteristics Cooperative  Mood Pleasant  Thought Process  Coherency WDL  Content WDL  Delusions None reported or observed  Perception WDL  Hallucination None reported or observed  Judgment Impaired  Confusion None  Danger to Self  Current suicidal ideation? Denies  Danger to Others  Danger to Others None reported or observed

## 2019-09-28 MED ORDER — RISPERIDONE 2 MG PO TABS: 2.0000 mg | ORAL_TABLET | Freq: Every day | ORAL | 0 refills | Status: DC

## 2019-09-28 MED ORDER — RISPERIDONE 1 MG PO TABS: 1.0000 mg | ORAL_TABLET | ORAL | 0 refills | Status: DC

## 2019-09-28 NOTE — Progress Notes (Signed)
  [facility] Adult Case Management Discharge Plan :  Will you be returning to the same living situation after discharge:  Yes,  to home with mother and daughter. At discharge, do you have transportation home?: Yes,  father to pick patient up. Do you have the ability to pay for your medications: Yes,  has insurance.   Release of information consent forms completed and in the chart;  Patient's signature needed at discharge.  Patient to Follow up at:  Follow-up Information    [facility] Follow up on 11/01/2019.   Specialty: Behavioral Health Why: You have an appointment for medication management on 11/01/19 at 1:00 pm.  This will be a Virtual appointment.  The provider will be sending you paperwork to be completed and returned, in order to keep your appointment. Contact information: [address] [place] [address] [phone]              Next level of care provider has access to [facility] Link:yes  Safety Planning and Suicide Prevention discussed: Yes,  with mother.     Has patient been referred to the Quitline?: N/A patient is not a smoker  Patient has been referred for addiction treatment: Pt. refused referral  Otelia Santee, LCSWA 09/28/2019, 9:54 AM

## 2019-09-28 NOTE — Discharge Summary (Signed)
Physician Discharge Summary Note  Patient:  Stephanie Cabrera is an 19 y.o., female MRN:  [id number] DOB:  04-Sep-2000 Patient phone:  [phone] (home)  Patient address:   [address] [place] [address],  Total Time spent with patient: 15 minutes  Date of Admission:  09/24/2019 Date of Discharge: 09/28/19  Reason for Admission:  Bizarre behaviors  Principal Problem: <principal problem not specified> Discharge Diagnoses: Active Problems:   Brief psychotic disorder [facility])   Past Psychiatric History: denies prior psychiatric admissions . Denies past history of suicide attempts. Remote history of self cutting episodes when  in Middle School but not since then, denies prior history of psychosis, but describes auditory hallucinations for several weeks to months.  Denies history of severe depressive episodes, does report mild depression following having her child , who is now one year old. Denies prior history of mania or hypomania. Endorses history of anxiety, described as " worrying a lot".   Past Medical History:  Past Medical History:  Diagnosis Date  . Allergy   . Medical history non-contributory     Past Surgical History:  Procedure Laterality Date  . NO PAST SURGERIES     Family History:  Family History  Problem Relation Age of Onset  . Diabetes Paternal Grandmother   . Diabetes Paternal Grandfather   . Hypertension Paternal Grandfather    Family Psychiatric  History: denies history of mental illness in family  Social History:  Social History   Substance and Sexual Activity  Alcohol Use Never  . Alcohol/week: 0.0 standard drinks     Social History   Substance and Sexual Activity  Drug Use Never    Social History   Socioeconomic History  . Marital status: Single    Spouse name: Not on file  . Number of children: Not on file  . Years of education: Not on file  . Highest education level: Not on file  Occupational History  . Not on file  Tobacco  Use  . Smoking status: Never Smoker  . Smokeless tobacco: Never Used  Vaping Use  . Vaping Use: Never used  Substance and Sexual Activity  . Alcohol use: Never    Alcohol/week: 0.0 standard drinks  . Drug use: Never  . Sexual activity: Not Currently    Birth control/protection: None  Other Topics Concern  . Not on file  Social History Narrative  . Not on file   Social Determinants of Health   Financial Resource Strain:   . Difficulty of Paying Living Expenses:   Food Insecurity:   . Worried About Programme researcher, broadcasting/film/video in the Last Year:   . Barista in the Last Year:   Transportation Needs:   . Freight forwarder (Medical):   Marland Kitchen Lack of Transportation (Non-Medical):   Physical Activity:   . Days of Exercise per Week:   . Minutes of Exercise per Session:   Stress:   . Feeling of Stress :   Social Connections:   . Frequency of Communication with Friends and Family:   . Frequency of Social Gatherings with Friends and Family:   . Attends Religious Services:   . Active Member of Clubs or Organizations:   . Attends Banker Meetings:   Marland Kitchen Marital Status:     Hospital Course:  From admission H&P: 19 year old female, lives with her mother and her one year old child. She presented to ED on 6/17 under IVC. Parent reported that patient  had been acting strangely , bizarrely, standing on a dresser  internally preoccupied . Currently she states she does not recollect above events and that last thing she remembers " I was telling my mother I was doing OK". According to IVC paperwork patient has had prior similar episodes . Today she acknowledges auditory hallucinations , which she states are conversations with God and angels. States she is not concerned about these because " they say good things " to her.  She denies drug or alcohol abuse . Admission BAL and UDS are negative. She denies mood symptoms or depression leading up to admission and states her mood has been "  all right". Currently denies neuro-vegetative symptoms and reports sleep, appetite, energy level as normal and denies anhedonia. She also denies having had any suicidal ideations or self injurious ideations leading up to admission. She does endorse anxiety, and states she tends to worry excessively.  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 She also states she was advised not to work after seizure like episode about a month ago. States she had a seizure like episode ( describes as " shaking and passing out") one month ago. Denies any seizures or seizure like episodes before or since this episode . With patient's consent I spoke with her mother via phone ( [phone]). Mother reports that she feels patient has had some behavioral changes for a few weeks. For example, she had recently expressed fear that the tree outside of her home was falling on the house and required significant reassurance from mother. She has also been reporting auditory hallucinations, occurring mainly at nighttime. Mother states that on day of admission she was standing on a dresser, banging wall, for no apparent reason. (The TV was knocked over/ broke  but reportedly this was not on purpose ). Mother also confirms that patient had a seizure episode a month ago. At the time went to ED but left without being seen .She has had no further episodes since then.  * Of note , a head CT scan was done on 09/23/2019- normal  Ms. Clites was admitted for bizarre behaviors and hallucinations. She remained on the [facility] unit for four days. She was started on Risperdal. She participated in group therapy on the unit. She responded well to treatment with no adverse effects reported. She has shown improved mood, affect, sleep, and interaction. She denies any SI/HI/AVH and contracts for safety. She shows no signs of responding to internal stimuli. She is discharging on the  medications listed below. She agrees to follow up at [facility] (see below). Patient is provided with prescriptions for medications upon discharge. Her father is picking her up for discharge home.  Physical Findings: AIMS: Facial and Oral Movements Muscles of Facial Expression: None, normal Lips and Perioral Area: None, normal Jaw: None, normal Tongue: None, normal,Extremity Movements Upper (arms, wrists, hands, fingers): None, normal Lower (legs, knees, ankles, toes): None, normal, Trunk Movements Neck, shoulders, hips: None, normal, Overall Severity Severity of abnormal movements (highest score from questions above): None, normal Incapacitation due to abnormal movements: None, normal Patient's awareness of abnormal movements (rate only patient's report): No Awareness, Dental Status Current problems with teeth and/or dentures?: No Does patient usually wear dentures?: No  CIWA:    COWS:     Musculoskeletal: Strength & Muscle Tone: within normal limits Gait & Station: normal Patient leans: N/A  Psychiatric Specialty Exam:  Physical Exam  Nursing note and vitals reviewed. Constitutional: She is oriented to person, place, and time. She appears well-developed.  Cardiovascular: Normal rate.  Respiratory: Effort normal.  Neurological: She is alert and oriented to person, place, and time.    Review of Systems  Constitutional: Negative.   Respiratory: Negative for cough and shortness of breath.   Psychiatric/Behavioral: Negative for agitation, behavioral problems, confusion, decreased concentration, dysphoric mood, hallucinations, self-injury, sleep disturbance and suicidal ideas. The patient is not nervous/anxious and is not hyperactive.     Blood pressure 123/73, pulse 84, temperature 98 F (36.7 C), temperature source Oral, resp. rate 18, height 5\' 7"  (1.702 m), weight 68.9 kg, last menstrual period 09/16/2019, SpO2 97 %, not currently breastfeeding.Body mass index  is 23.81 kg/m.  See MD's discharge SRA      Has this patient used any form of tobacco in the last 30 days? (Cigarettes, Smokeless Tobacco, Cigars, and/or Pipes)  No  Blood Alcohol level:  Lab Results  Component Value Date   ETH <10 78/29/5621    Metabolic Disorder Labs:  Lab Results  Component Value Date   HGBA1C 5.3 09/25/2019   MPG 105.41 09/25/2019   No results found for: PROLACTIN Lab Results  Component Value Date   CHOL 183 09/25/2019   TRIG 60 09/25/2019   HDL 67 09/25/2019   CHOLHDL 2.7 09/25/2019   VLDL 12 09/25/2019   LDLCALC 104 (H) 09/25/2019    See Psychiatric Specialty Exam and Suicide Risk Assessment completed by Attending Physician prior to discharge.  Discharge destination:  Home  Is patient on multiple antipsychotic therapies at discharge:  No   Has Patient had three or more failed trials of antipsychotic monotherapy by history:  No  Recommended Plan for Multiple Antipsychotic Therapies: NA  Discharge Instructions    Discharge instructions   Complete by: As directed    Patient is instructed to take all prescribed medications as recommended. Report any side effects or adverse reactions to your outpatient psychiatrist. Patient is instructed to abstain from alcohol and illegal drugs while on prescription medications. In the event of worsening symptoms, patient is instructed to call the crisis hotline, 911, or go to the nearest emergency department for evaluation and treatment.     Allergies as of 09/28/2019   No Known Allergies     Medication List    STOP taking these medications   acetaminophen 325 MG tablet Commonly known as: Tylenol     TAKE these medications     Indication  risperiDONE 2 MG tablet Commonly known as: RISPERDAL Take 1 tablet (2 mg total) by mouth at bedtime.  Indication: Psychosis   risperiDONE 1 MG tablet Commonly known as: RISPERDAL Take 1 tablet (1 mg total) by mouth every morning. Start taking on: September 29, 2019  Indication: Psychosis       Follow-up [facility] Follow up on 11/01/2019.   Specialty: Behavioral Health Why: You have an appointment for medication management on 11/01/19 at 1:00 pm.  This will be a Virtual appointment.  The provider will be sending you paperwork to be completed and returned, in order to keep your appointment. Contact information: [place] [phone]              Follow-up recommendations: Activity as tolerated. Diet as recommended by primary care physician. Keep all scheduled follow-up appointments as recommended.  Comments:   Patient is instructed to  take all prescribed medications as recommended. Report any side effects or adverse reactions to your outpatient psychiatrist. Patient is instructed to abstain from alcohol and illegal drugs while on prescription medications. In the event of worsening symptoms, patient is instructed to call the crisis hotline, 911, or go to the nearest emergency department for evaluation and treatment.  Signed: Aldean Baker, NP 09/28/2019, 3:38 PM

## 2019-09-28 NOTE — Progress Notes (Signed)
Pt discharged to lobby. Pt was stable and appreciative at that time. All papers and prescriptions were given and valuables returned. Verbal understanding expressed. Denies SI/HI and A/VH. Pt given opportunity to express concerns and ask questions.  

## 2019-09-28 NOTE — BHH Suicide Risk Assessment (Signed)
[facility] Discharge Suicide Risk Assessment   Principal Problem: <principal problem not specified> Discharge Diagnoses: Active Problems:   Brief psychotic disorder (HCC)   Total Time spent with patient: 20 minutes  Musculoskeletal: Strength & Muscle Tone: within normal limits Gait & Station: normal Patient leans: N/A  Psychiatric Specialty Exam: Review of Systems  All other systems reviewed and are negative.   Blood pressure 123/73, pulse 84, temperature 98 F (36.7 C), temperature source Oral, resp. rate 18, height 5\' 7"  (1.702 m), weight 68.9 kg, last menstrual period 09/16/2019, SpO2 97 %, not currently breastfeeding.Body mass index is 23.81 kg/m.  General Appearance: Casual  Eye Contact::  Good  Speech:  Normal Rate409  Volume:  Normal  Mood:  Euthymic  Affect:  Congruent  Thought Process:  Coherent and Descriptions of Associations: Intact  Orientation:  Full (Time, Place, and Person)  Thought Content:  Logical  Suicidal Thoughts:  No  Homicidal Thoughts:  No  Memory:  Immediate;   Good Recent;   Good Remote;   Good  Judgement:  Intact  Insight:  Fair  Psychomotor Activity:  Normal  Concentration:  Good  Recall:  Good  Fund of Knowledge:Fair  Language: Good  Akathisia:  Negative  Handed:  Right  AIMS (if indicated):     Assets:  Communication Skills Desire for Improvement Housing Resilience Social Support  Sleep:  Number of Hours: 6  Cognition: WNL  ADL's:  Intact   Mental Status Per Nursing Assessment::   On Admission:  NA  Demographic Factors:  Adolescent or young adult  Loss Factors: NA  Historical Factors: Impulsivity  Risk Reduction Factors:   Sense of responsibility to family, Living with another person, especially a relative and Positive social support  Continued Clinical Symptoms:  Schizophrenia:   Less than 31 years old Paranoid or undifferentiated type  Cognitive Features That Contribute To Risk:  None    Suicide Risk:  Minimal: No  identifiable suicidal ideation.  Patients presenting with no risk factors but with morbid ruminations; may be classified as minimal risk based on the severity of the depressive symptoms   Follow-up Information    [facility] Follow up on 11/01/2019.   Specialty: Behavioral Health Why: You have an appointment for medication management on 11/01/19 at 1:00 pm.  This will be a Virtual appointment.  The provider will be sending you paperwork to be completed and returned, in order to keep your appointment. Contact information: [address] [place] [phone]              Plan Of Care/Follow-up recommendations:  Activity:  ad lib  [phone], MD 09/28/2019, 9:14 AM

## 2019-10-14 ENCOUNTER — Ambulatory Visit (HOSPITAL_COMMUNITY)
Admission: RE | Admit: 2019-10-14 | Discharge: 2019-10-14 | Disposition: A | Discharge status: Home / Self Care | Payer: BC Managed Care – PPO | Attending: Psychiatry | Admitting: Psychiatry

## 2019-10-14 DIAGNOSIS — R443 Hallucinations, unspecified: Secondary | ICD-10-CM | POA: Diagnosis not present

## 2019-10-14 DIAGNOSIS — R440 Auditory hallucinations: Secondary | ICD-10-CM | POA: Insufficient documentation

## 2019-10-14 NOTE — BH Assessment (Addendum)
Comprehensive Clinical Assessment (CCA) Screening, Triage and Referral Note  10/14/2019 Stephanie Cabrera [id number] -Pt was brought to [facility] by her sister.  Patient has been hearing voices this evening after not hearing them since her Stephanie Cabrera discharge on 09/28/19.  Patient has been crying.  When asked about it she said that she was sad because she is away from her 21 year old Stephanie Cabrera.  Pt and her Stephanie Cabrera live with her mother.    Patient denies SI, HI or visual hallucinations.  Pt denies any use of ETOH or illicit drugs.    Patient says that she has been taking her discharge medications as directed.  She had not been hearing voices until tonight.  She was alarmed by this and told her mother.  Sister then brought her to [facility].  Patient asked multiple times if the doctor was ging to increase her medications.  Clinician informed her to keep taking medications as currently prescribed.  Pt has good eye contact and is oriented.  She did mumble to herself a couple of times.  Pt not engaged in delusional thoughts.  Her thought process with clinician is coherent and linear.  She does ask that same question about whether her medication is going to be increased.  Renaye Rakers, NP conducted the MSE.  She and clinician discussed patient care.  Patient does not meet inpatient care criteria.  Patient has a follow up appointment 11-01-19 with [facility] outpatient from discharge from Stephanie Cabrera.  Patient was encouraged to keep up with that appointment.  Patient's mother Stephanie Cabrera) was contacted, with patient permission.  She said that around 03:30 patient was up and talking about "I'm falling."  She seemed to be hearing voices.  Patient has been taking her medications as prescribed.  Clinician explained about patient not needing inpatient care at this time.  Also informed mother of [facility] if patient condition worsens between now and 07/26.  Mother wrote down information about [facility] location and the open access on Fridays from  13:00-17:00.  Mother said that sister will come back to pick up patient.  Clinician talked with patient and presented her with same information.  Patient voiced understanding and went to lobby to wait for sister.  Visit Diagnosis: No diagnosis found.  Patient Reported Information How did you hear about Stephanie Cabrera? Family/Friend   Referral name: Sister brought her to [facility].  She is voluntary.   Referral phone number: No data recorded Whom do you see for routine medical problems? I don't have a doctor   Practice/Facility Name: No data recorded  Practice/Facility Phone Number: No data recorded  Name of Contact: No data recorded  Contact Number: No data recorded  Contact Fax Number: No data recorded  Prescriber Name: No data recorded  Prescriber Address (if known): No data recorded What Is the Reason for Your Visit/Call Today? Still hearing voices.  Voices had gone away and returned tonight.  Crying because she misses her Stephanie Cabrera.  Stephanie Cabrera (69 year old) is at home.  How Long Has This Been Causing You Problems? > than 6 months  Have You Recently Been in Any Inpatient Treatment (Cab[facility]/Detox/Crisis Center/28-Day Program)? Yes   Name/Location of Program/[facility]   How Long Were You There? Four days   When Were You Discharged? 10/28/19  Have You Ever Received Services From [employer] [facility] Before? Yes   Who Do You See at [facility]? [facility] inpatient  Have You Recently Had Any Thoughts About Hurting Yourself? No   Are You Planning to Commit  Suicide/Harm Yourself At This time?  No  Have you Recently Had Thoughts About Hurting Someone [name]? No   Explanation: No data recorded Have You Used Any Alcohol or Drugs in the Past 24 Hours? No   How Long Ago Did You Use Drugs or Alcohol?  No data recorded  What Did You Use and How Much? No data recorded What Do You Feel Would Help You the Most Today? Medication  Do You Currently Have a Therapist/Psychiatrist? No (Pt does not recall outpatient  being set up.)   Name of Therapist/Psychiatrist: No data recorded  Have You Been Recently Discharged From Any Office Practice or Programs? No   Explanation of Discharge From Practice/Program:  No data recorded    CCA Screening Triage Referral Assessment Type of Contact: Face-to-Face   Is this Initial or Reassessment? Initial Assessment   Date Telepsych consult ordered in [facility]:  10/14/19   Time Telepsych consult ordered in [facility]:  0521  Patient Reported Information Reviewed? Yes   Patient Left Without Being Seen? No data recorded  Reason for Not Completing Assessment: No data recorded Collateral Involvement: Mother- Stephanie Cabrera  Does Patient Have a Automotive engineer Guardian? No data recorded  Name and Contact of Legal Guardian:  No data recorded If Minor and Not Living with Parent(s), Who has Custody? No data recorded Is CPS involved or ever been involved? Never  Is APS involved or ever been involved? Never  Patient Determined To Be At Risk for Harm To Self or Others Based on Review of Patient Reported Information or Presenting Complaint? No   Method: No data recorded  Availability of Means: No data recorded  Intent: No data recorded  Notification Required: No data recorded  Additional Information for Danger to Others Potential:  No data recorded  Additional Comments for Danger to Others Potential:  No data recorded  Are There Guns or Other Weapons in Your Home?  No data recorded   Types of Guns/Weapons: No data recorded   Are These Weapons Safely Secured?                              No data recorded   Who Could Verify You Are Able To Have These Secured:    No data recorded Do You Have any Outstanding Charges, Pending Court Dates, Parole/Probation? No data recorded Contacted To Inform of Risk of Harm To Self or Others: No data recorded Location of Assessment: [facility] ED  Does Patient Present under Involuntary Commitment? No   IVC Papers Initial File Date: 09/22/19   [place]  of Residence: [place]  Patient Currently Receiving the Following Services: Not Receiving Services (May have an outpatient appt w/ [facility] outpatient)   Determination of Need: Emergent (2 hours)   Options For Referral: Medication Management   [place], LCAS

## 2019-10-14 NOTE — H&P (Addendum)
Behavioral Health Medical Screening Exam  Stephanie Cabrera is a 19 y.o. female who presents to [facility] voluntarily as a walk-in accompanied by her sister for auditory hallucination. Pt states she started hearing the voices yesterday but she could not make out what the voices were saying. Pt reports she has not heard any voices since she was  discharged from [facility] on 6/22. Pt states she takes her medication as prescribed and feels she may need a higher dose. Pt denies SI, HI, SH, paranoia and prior SA. She has an [facility] on 7/[facility]. Pt states she can contract for safety and want to follow up outpatient.   Total Time spent with patient: 15 minutes  Psychiatric Specialty Exam  Presentation  General Appearance:No data recorded Eye Contact:No data recorded Speech:No data recorded Speech Volume:No data recorded Handedness:No data recorded  Mood and Affect  Mood:No data recorded Affect:No data recorded  Thought Process  Thought Processes:No data recorded Descriptions of Associations:No data recorded Orientation:No data recorded Thought Content:No data recorded Hallucinations:No data recorded Ideas of Reference:No data recorded Suicidal Thoughts:No data recorded Homicidal Thoughts:No data recorded  Sensorium  Memory:No data recorded Judgment:No data recorded Insight:No data recorded  Executive Functions  Concentration:No data recorded Attention Span:No data recorded Recall:No data recorded Fund of Knowledge:No data recorded Language:No data recorded  Psychomotor Activity  Psychomotor Activity:No data recorded  Assets  Assets:No data recorded  Sleep  Sleep:No data recorded Number of hours: No data recorded  Physical Exam: Physical Exam HENT:     Head: Normocephalic.  Eyes:     Pupils: Pupils are equal, round, and reactive to light.  Pulmonary:     Effort: Pulmonary effort is normal.  Musculoskeletal:         General: Normal range of motion.     Cervical back: Normal range of motion.  Skin:    General: Skin is warm and dry.  Neurological:     Mental Status: She is alert and oriented to person, place, and time.  Psychiatric:        Attention and Perception: She perceives auditory hallucinations.        Mood and Affect: Mood is anxious.        Speech: Speech normal.        Behavior: Behavior normal.        Thought Content: Thought content normal.        Cognition and Memory: Cognition normal.        Judgment: Judgment normal.    Review of Systems  Psychiatric/Behavioral: Positive for hallucinations. Negative for depression, memory loss, substance abuse and suicidal ideas. The patient is nervous/anxious. The patient does not have insomnia.   All other systems reviewed and are negative.  Last menstrual period 09/16/2019, not currently breastfeeding. There is no height or weight on file to calculate BMI.  Musculoskeletal: Strength & Muscle Tone: within normal limits Gait & Station: normal Patient leans: N/A   Recommendations:  Based on my evaluation the patient does not appear to have an emergency medical condition.   Disposition: No evidence of imminent risk to self or others at present.   Patient does not meet criteria for psychiatric inpatient admission. Supportive therapy provided about ongoing stressors. Discussed crisis plan, support from social network, calling 911, coming to the Emergency Department, and calling Suicide Hotline.  Wandra Arthurs, NP 10/14/2019, 6:23 AM

## 2019-10-27 ENCOUNTER — Telehealth: Payer: Self-pay | Admitting: Family Medicine

## 2019-10-27 MED ORDER — RISPERIDONE 2 MG PO TABS: 2.0000 mg | ORAL_TABLET | Freq: Every day | ORAL | 0 refills | Status: DC

## 2019-10-27 MED ORDER — RISPERIDONE 1 MG PO TABS: 1.0000 mg | ORAL_TABLET | ORAL | 0 refills | Status: DC

## 2019-10-27 NOTE — Telephone Encounter (Signed)
Spoke with Dr. [place] - okay to send in 7 days. Rx's sent & patient's mom made aware.

## 2019-10-27 NOTE — Telephone Encounter (Signed)
Spoke with patient & her mother. Pt is out of her Risperidone Rx's and does not see Psychiatry until Monday. Outpatient BH from hospital won't refill Rx's since she isn't actively there. Waiting on PCP response to see if 7 tablets can be sent in until pt is seen Monday.

## 2019-10-27 NOTE — Telephone Encounter (Signed)
Pt would like to have a call back to speak about something personal did not want to elaborate on it.

## 2019-11-01 ENCOUNTER — Telehealth (INDEPENDENT_AMBULATORY_CARE_PROVIDER_SITE_OTHER): Payer: BC Managed Care – PPO | Admitting: Psychiatry

## 2019-11-01 ENCOUNTER — Other Ambulatory Visit: Payer: Self-pay

## 2019-11-01 ENCOUNTER — Encounter (HOSPITAL_COMMUNITY): Payer: Self-pay | Admitting: Psychiatry

## 2019-11-01 DIAGNOSIS — F23 Brief psychotic disorder: Secondary | ICD-10-CM | POA: Diagnosis not present

## 2019-11-01 MED ORDER — RISPERIDONE 4 MG PO TABS: 4.0000 mg | ORAL_TABLET | Freq: Every day | ORAL | 0 refills | Status: DC

## 2019-11-01 NOTE — Progress Notes (Signed)
BH MD/PA/NP OP Progress Note  11/01/2019 1:38 PM Stephanie Cabrera  MRN:  [id number] Interview was conducted using videoconferencing application and I verified that I was speaking with the correct person using two identifiers. I discussed the limitations of evaluation and management by telemedicine and  the availability of in person appointments. Patient expressed understanding and agreed to proceed. Patient location - home; physician - home office.  Chief Complaint: Auditory hallucinations.  HPI: Stephanie Cabrera is a 19 yo AAF who comes for her first post-hospitalization visit. She was recently (7/19-22?21 hospitalized at [facility] for psychotic break. She started to experience hallucinations about 2 months ago, felt confused, having memory problems and per IVC papers started to destroy furniture/TV in her room. She denies ever before having similar experiences (confirmed by her mother). AH appeared suddenly; she denies any new medications, any sudden stress, she does not abuse alcohol or drugs. The only recent potentially stressful event is giving birth to her fists child Stephanie Cabrera (on 09/06/19).  There is no psychiatric or substance abuse hx in her biological family. While in [facility] she was started on risperidone 1 mg in AM and 2 at bedtime. AH subsided but not resolved 0 she describes these as a single female, unknown voice which at times woul tell her to do things like get on bed etc. It does not command her to harm self, child or others. It comes and goes but is still present on daily basis. She feels somewhat sedated after taking morning dose of risperidone. She denies having tremor, muscle twitching, difficulty swallowing. There is perhaps some increase in appetite observed. Her mood is neutral. She does not verbalize any paranoia or other delusional thought content. There is no hx of mania, depression but she admits to some anxiety predating current episode. She has a remote (middle school) hx of cutting.  This was her first psychiatric admission. Chart review indicates that about 2 months ago she had a seizure like episode - none before or since. She stopped working Facilities manager job). Head CT done on 09/23/19 was normal. Lab results were reviewed - also normal. Medical history  Is noncontributory.  Visit Diagnosis:    ICD-10-CM   1. Brief psychotic disorder (HCC)  F23     Past Psychiatric History: See above.  Past Medical History:  Past Medical History:  Diagnosis Date  . Allergy   . Medical history non-contributory     Past Surgical History:  Procedure Laterality Date  . NO PAST SURGERIES      Family Psychiatric History: None.  Family History:  Family History  Problem Relation Age of Onset  . Diabetes Paternal Grandmother   . Diabetes Paternal Grandfather   . Hypertension Paternal Grandfather     Social History:  Social History   Socioeconomic History  . Marital status: Single    Spouse name: Not on file  . Number of children: 1  . Years of education: Not on file  . Highest education level: Not on file  Occupational History  . Not on file  Tobacco Use  . Smoking status: Never Smoker  . Smokeless tobacco: Never Used  Vaping Use  . Vaping Use: Never used  Substance and Sexual Activity  . Alcohol use: Never    Alcohol/week: 0.0 standard drinks  . Drug use: Never  . Sexual activity: Not Currently    Birth control/protection: None  Other Topics Concern  . Not on file  Social History Narrative  . Not on file   Social  Determinants of Health   Financial Resource Strain:   . Difficulty of Paying Living Expenses:   Food Insecurity:   . Worried About Programme researcher, broadcasting/film/video in the Last Year:   . Barista in the Last Year:   Transportation Needs:   . Freight forwarder (Medical):   Marland Kitchen Lack of Transportation (Non-Medical):   Physical Activity:   . Days of Exercise per Week:   . Minutes of Exercise per Session:   Stress:   . Feeling of Stress :   Social  Connections:   . Frequency of Communication with Friends and Family:   . Frequency of Social Gatherings with Friends and Family:   . Attends Religious Services:   . Active Member of Clubs or Organizations:   . Attends Banker Meetings:   Marland Kitchen Marital Status:     Allergies: No Known Allergies  Metabolic Disorder Labs: Lab Results  Component Value Date   HGBA1C 5.3 09/25/2019   MPG 105.41 09/25/2019   No results found for: PROLACTIN Lab Results  Component Value Date   CHOL 183 09/25/2019   TRIG 60 09/25/2019   HDL 67 09/25/2019   CHOLHDL 2.7 09/25/2019   VLDL 12 09/25/2019   LDLCALC 104 (H) 09/25/2019   Lab Results  Component Value Date   TSH 1.195 09/25/2019   TSH 1.479 09/23/2019    Therapeutic Level Labs: No results found for: LITHIUM No results found for: VALPROATE No components found for:  CBMZ  Current Medications: Current Outpatient Medications  Medication Sig Dispense Refill  . risperiDONE (RISPERDAL) 4 MG tablet Take 1 tablet (4 mg total) by mouth at bedtime. 30 tablet 0   No current facility-administered medications for this visit.     Psychiatric Specialty Exam: Review of Systems  Constitutional: Positive for appetite change.  Psychiatric/Behavioral: Positive for decreased concentration and hallucinations.  All other systems reviewed and are negative.   not currently breastfeeding.There is no height or weight on file to calculate BMI.  General Appearance: Casual and Well Groomed  Eye Contact:  Good  Speech:  Clear and Coherent and Normal Rate  Volume:  Normal  Mood:  Euthymic  Affect:  Full Range  Thought Process:  Goal Directed  Orientation:  Full (Time, Place, and Person)  Thought Content: Hallucinations: Auditory   Suicidal Thoughts:  No  Homicidal Thoughts:  No  Memory:  Immediate;   Fair Recent;   Fair Remote;   Good  Judgement:  Good  Insight:  Fair  Psychomotor Activity:  Normal  Concentration:  Concentration: Fair   Recall:  Fair  Fund of Knowledge: Good  Language: Good  Akathisia:  Negative  Handed:  Right  AIMS (if indicated): not done  Assets:  Communication Skills Desire for Improvement Financial Resources/Insurance Housing Physical Health Social Support Talents/Skills  ADL's:  Intact  Cognition: WNL  Sleep:  Good   Screenings: AIMS     [facility]  AIMS Total Score 0    AUDIT     [facility]  Alcohol Use Disorder Identification Test Final Score (AUDIT) 0    GAD-7     Office Visit from 09/15/2019 in [facility] [place]  Total GAD-7 Score 6    PHQ2-9     Office Visit from 09/15/2019 in [facility] Total Score 2  PHQ-9 Total Score 8  Assessment and Plan: 19 yo AAF who comes for her first post-hospitalization visit. She was recently (7/19-22?21 hospitalized at [facility] for psychotic break. She started to experience hallucinations about 2 months ago, felt confused, having memory problems and per IVC papers started to destroy furniture/TV in her room. She denies ever before having similar experiences (confirmed by her mother). AH appeared suddenly; she denies any new medications, any sudden stress, she does not abuse alcohol or drugs. The only recent potentially stressful event is giving birth to her fists child Stephanie Cabrera (on 09/06/19).  There is no psychiatric or substance abuse hx in her biological family. While in [facility] she was started on risperidone 1 mg in AM and 2 at bedtime. AH subsided but not resolved 0 she describes these as a single female, unknown voice which at times woul tell her to do things like get on bed etc. It does not command her to harm self, child or others. It comes and goes but is still present on daily basis. She feels somewhat sedated after taking morning dose of risperidone. She denies  having tremor, muscle twitching, difficulty swallowing. There is perhaps some increase in appetite observed. Her mood is neutral. She does not verbalize any paranoia or other delusional thought content. There is no hx of mania, depression but she admits to some anxiety predating current episode. She has a remote (middle school) hx of cutting. This was her first psychiatric admission. Chart review indicates that about 2 months ago she had a seizure like episode - none before or since. She stopped working Facilities manager job). Head CT done on 09/23/19 was normal. Lab results were reviewed - also normal.  Dx: Brief psychotic disorder   Plan: I will continue risperidone but change dose to 4 mg at HS only. She has no EPS and will call office should she develop tremor or muscle twitching. Next appointment in 4 weeks. The plan was discussed with patient who had an opportunity to ask questions and these were all answered. I spend 45 minutes in videoconferencing with the patient ( and mother) and devoted approximately 50% of this time to explanation of diagnosis, discussion of treatment options and med education.   Magdalene Patricia, MD 11/01/2019, 1:38 PM

## 2019-11-02 ENCOUNTER — Encounter: Payer: Self-pay | Admitting: Neurology

## 2019-11-02 ENCOUNTER — Ambulatory Visit (INDEPENDENT_AMBULATORY_CARE_PROVIDER_SITE_OTHER): Payer: BC Managed Care – PPO | Admitting: Neurology

## 2019-11-02 ENCOUNTER — Other Ambulatory Visit: Payer: Self-pay

## 2019-11-02 VITALS — BP 105/70 | HR 72 | Ht 67.0 in | Wt 164.0 lb

## 2019-11-02 DIAGNOSIS — R413 Other amnesia: Secondary | ICD-10-CM

## 2019-11-02 DIAGNOSIS — R440 Auditory hallucinations: Secondary | ICD-10-CM

## 2019-11-02 DIAGNOSIS — R569 Unspecified convulsions: Secondary | ICD-10-CM | POA: Diagnosis not present

## 2019-11-02 NOTE — Patient Instructions (Signed)
Good to meet you!  1. Schedule MRI brain with and without contrast  2. Schedule 1-hour EEG  3. As per [place] driving laws, no driving until 6 months seizure-free  4. Follow-up in 6 months, call for any changes.  Seizure Precautions: 1. If medication has been prescribed for you to prevent seizures, take it exactly as directed.  Do not stop taking the medicine without talking to your doctor first, even if you have not had a seizure in a long time.   2. Avoid activities in which a seizure would cause danger to yourself or to others.  Don't operate dangerous machinery, swim alone, or climb in high or dangerous places, such as on ladders, roofs, or girders.  Do not drive unless your doctor says you may.  3. If you have any warning that you may have a seizure, lay down in a safe place where you can't hurt yourself.    4.  No driving for 6 months from last seizure, as per [facility].   Please refer to the following link on the Epilepsy Foundation of America's website for more information: http://www.epilepsyfoundation.org/answerplace/Social/driving/drivingu.cfm   5.  Maintain good sleep hygiene.   6.  Notify your neurology if you are planning pregnancy or if you become pregnant.  7.  Contact your doctor if you have any problems that may be related to the medicine you are taking.  8.  Call 911 and bring the patient back to the ED if:        A.  The seizure lasts longer than 5 minutes.       B.  The patient doesn't awaken shortly after the seizure  C.  The patient has new problems such as difficulty seeing, speaking or moving  D.  The patient was injured during the seizure  E.  The patient has a temperature over 102 F (39C)  F.  The patient vomited and now is having trouble breathing

## 2019-11-02 NOTE — Progress Notes (Signed)
NEUROLOGY CONSULTATION NOTE  Stephanie Cabrera MRN: [id number] DOB: February 18, 2001  Referring provider: Dr. Betty Swaziland Primary care provider: Dr. Betty Swaziland  Reason for consult:  Seizure-like activity  Dear Dr Swaziland:  Thank you for your kind referral of Stephanie Cabrera for consultation of the above symptoms. Although her history is well known to you, please allow me to reiterate it for the purpose of our medical record. The patient was accompanied to the clinic by her mother who also provides collateral information. Records and images were personally reviewed where available.   HISTORY OF PRESENT ILLNESS: This is a pleasant 19 year old right-handed woman with a history of anxiety presenting for evaluation of seizure-like activity that occurred on 09/02/2019. She was in her usual state of health until 09/02/19 when she recalls sitting on the commode then waking up in the ambulance. Her mother reports that she was tired and took 4 tablets of melatonin 3mg  which her mother thought was too much. She increased her water intake and was in the bathroom when she called her mother saying her right hand was going numb, then she started losing consciousness and fell off the toilet. Her mother laid her on the ground and saw her whole body stiffening and shaking for 3 minutes. She bit her tongue. When EMS arrived, she could say her name and follow instructions, no focal weakness. She left the ER AMA. She saw her PCP on 09/15/19 and reported increassed stress worse after her baby was born in 08/2018. A week later, she was brought to the ER on 6/17 for IVC because she started destroying her TV and lamp in her bedroom. Family walked into the room and she was naked on her dresser with items around her. Family calmed her down and she indicated no memory of the event. According to IVC paperwork, this has happened 2 times previously, she had told them she has been speaking to God. She had not been sleeping  well. Head CT no acute changes. 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 Per notes, she had been having auditory hallucinations for several weeks to months, however her mother states she started hallucinating after the seizure in May. She went back to [facility] on 7/8 due to recurrence of auditory hallucinations. Dose increased to 4mg  qhs yesterday. She denies any further hallucinations. She states her mood is good, she denies any depression or anxiety. Her mother reports that when the seizure occurred, her anxiety was bad. 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 She is now sleeping better.   She has had constant numbness in her hands since she gave birth a year ago, R>L. She denies any headaches, dizziness, diplopia, neck/back pain, bowel/bladder dysfunction. She has occasional episodes of a metallic taste in her mouth for a few minutes that occur a couple of times a month. She has occasional jerks in her back. Her mother denies any staring/unresponsive episodes. She lives with her mother and 5 year old daughter June. Her mother feels she is back to baseline and is mostly worried about her memory. Since the seizure, she cannot remember where she puts things. Her mother administers her medication. She has not been driving. She denies any alcohol or illicit drug use.   Epilepsy Risk Factors:  Her paternal grandfather had seizures. Otherwise she had a normal birth and early  development.  There is no history of febrile convulsions, CNS infections such as meningitis/encephalitis, significant traumatic brain injury, neurosurgical procedures.   PAST MEDICAL HISTORY: Past Medical History:  Diagnosis Date  . Allergy   . Medical history non-contributory   . Seizures (HCC)     PAST SURGICAL HISTORY: Past Surgical History:  Procedure Laterality Date  . NO PAST SURGERIES       MEDICATIONS: Current Outpatient Medications on File Prior to Visit  Medication Sig Dispense Refill  . risperiDONE (RISPERDAL) 4 MG tablet Take 1 tablet (4 mg total) by mouth at bedtime. 30 tablet 0   No current facility-administered medications on file prior to visit.    ALLERGIES: No Known Allergies  FAMILY HISTORY: Family History  Problem Relation Age of Onset  . Diabetes Paternal Grandmother   . Diabetes Paternal Grandfather   . Hypertension Paternal Grandfather     SOCIAL HISTORY: Social History   Socioeconomic History  . Marital status: Single    Spouse name: Not on file  . Number of children: 1  . Years of education: Not on file  . Highest education level: Not on file  Occupational History  . Not on file  Tobacco Use  . Smoking status: Never Smoker  . Smokeless tobacco: Never Used  Vaping Use  . Vaping Use: Never used  Substance and Sexual Activity  . Alcohol use: Never    Alcohol/week: 0.0 standard drinks  . Drug use: Never  . Sexual activity: Not Currently    Birth control/protection: None  Other Topics Concern  . Not on file  Social History Narrative   Right Handed   One Story    Lives with mom and daughter   No Caffeine   Social Determinants of Health   Financial Resource Strain:   . Difficulty of Paying Living Expenses:   Food Insecurity:   . Worried About Programme researcher, broadcasting/film/video in the Last Year:   . Barista in the Last Year:   Transportation Needs:   . Freight forwarder (Medical):   Marland Kitchen Lack of Transportation (Non-Medical):   Physical Activity:   . Days of Exercise per Week:   . Minutes of Exercise per Session:   Stress:   . Feeling of Stress :   Social Connections:   . Frequency of Communication with Friends and Family:   . Frequency of Social Gatherings with Friends and Family:   . Attends Religious Services:   . Active Member of Clubs or Organizations:   . Attends Banker Meetings:   Marland Kitchen Marital Status:    Intimate Partner Violence:   . Fear of Current or Ex-Partner:   . Emotionally Abused:   Marland Kitchen Physically Abused:   . Sexually Abused:     PHYSICAL EXAM: Vitals:   11/02/19 1301  BP: 105/70  Pulse: 72  SpO2: 98%   General: No acute distress Head:  Normocephalic/atraumatic Skin/Extremities: No rash, no edema Neurological Exam: Mental status: alert and oriented to person, place, and time, no dysarthria or aphasia, Fund of knowledge is appropriate.  Recent and remote memory are intact. 3/3 delayed recall.  Attention and concentration are normal. 4/5 WORLD backwards Cranial nerves: CN I: not tested CN II: pupils equal, round and reactive to light, visual fields intact CN III, IV, VI:  full range of motion, no nystagmus, no ptosis CN V: facial sensation intact CN VII: upper and lower face symmetric CN VIII: hearing intact to conversation Bulk & Tone: normal,  no fasciculations. Motor: 5/5 throughout with no pronator drift. Sensation: intact to light touch, cold, pin, vibration and joint position sense.  No extinction to double simultaneous stimulation.  Romberg test negative Deep Tendon Reflexes: +1 both UE, +2 both LE Cerebellar: no incoordination on finger to nose testing Gait: narrow-based and steady, able to tandem walk adequately. Tremor: none   IMPRESSION: This is a pleasant 19 year old right-handed woman with a history of anxiety, new onset auditory hallucinations diagnosed with a brief psychotic episode last 09/23/2019 when she was found confused. Curiously, her mother witnessed an episode concerning for seizure 3 weeks prior. Her neurological exam is normal. It is unclear if seizure is related to recent psychiatric diagnosis (ie temporal lobe seizures can present in a similar way, or NMDA encephalitis), however exam today is normal, encephalitis less likely. MRI brain with and without contrast and 1-hour EEG will be helpful to assess for focal abnormalities that increase risk for  recurrent seizures. We discussed that after an initial seizure, unless there are significant risk factors, an abnormal neurological exam, an EEG showing epileptiform abnormalities, and/or abnormal neuroimaging, treatment with an antiepileptic drug is not indicated. We discussed 10% of the population may have a single seizure. Patients with a single unprovoked seizure have a recurrence rate of 33% after a single seizure and 73% after a second seizure. Continue to monitor symptoms. She denies any further hallucinations on Risperdal 4mg  qhs. Her mother is also concerned about memory loss. [place] driving laws were discussed with the patient, and she knows to stop driving after a seizure, until 6 months seizure-free. Follow-up in 6 months or earlier if needed, they know to call for any changes.   Thank you for allowing me to participate in the care of this patient. Please do not hesitate to call for any questions or concerns.   , M.D.  CC: Dr. Patrcia Dolly

## 2019-11-07 ENCOUNTER — Other Ambulatory Visit: Payer: Self-pay

## 2019-11-07 ENCOUNTER — Ambulatory Visit (HOSPITAL_COMMUNITY)
Admission: EM | Admit: 2019-11-07 | Discharge: 2019-11-07 | Disposition: A | Discharge status: Home / Self Care | Payer: BC Managed Care – PPO

## 2019-11-07 DIAGNOSIS — F23 Brief psychotic disorder: Secondary | ICD-10-CM | POA: Diagnosis not present

## 2019-11-07 NOTE — ED Provider Notes (Signed)
Behavioral Health Medical Screening Exam  Stephanie Cabrera is a 19 y.o. female who presents voluntarily to [facility] with her mother due to increase in auditory hallucinations since Risperdal was changed to QHS on 11/01/2019.Patient was admitted to [facility] 09/24/2019-09/28/2019. At that time, the patient reported hearing voices of God and Angels. She was found standing on a dresser and she destroyed her TV and a lamp in her bedroom and was petitioned for IVC. She was started on Risperdal. Patient was seen by Dr. Hinton Dyer on 11/01/2019 and Risperdal was adjusted to 4 mg QHS.   On evaluation this evening, patient is alert and oriented x 4, pleasant, and cooperative. Speech is clear and coherent, normal pace, normal volume.  Mood is euthymic and affect is congruent with mood. Thought process is coherent. Reports auditory hallucinations. States that she is unable to describe the voices. No indication that patient is responding to internal stimuli. Denies paranoia. No evidence of delusional thought content. Denies suicidal ideations. Denies homicidal ideations. Denies substance abuse.   Total Time spent with patient: 20 minutes  Psychiatric Specialty Exam  Presentation  General Appearance:Well Groomed  Eye Contact:Good  Speech:Clear and Coherent  Speech Volume:Normal  Handedness:Right   Mood and Affect  Mood:Euthymic  Affect:Congruent   Thought Process  Thought Processes:Coherent;Linear  Descriptions of Associations:Intact  Orientation:Full (Time, Place and Person)  Thought Content:Logical  Hallucinations:Auditory states that she cannot describe the voices  Ideas of Reference:None  Suicidal Thoughts:No  Homicidal Thoughts:No   Sensorium  Memory:Immediate Fair;Recent Fair;Remote Good  Judgment:Good  Insight:Fair   Executive Functions  Concentration:Fair  Attention Span:Fair  Recall:Fair  Fund of Knowledge:Good  Language:Good   Psychomotor Activity   Psychomotor Activity:Normal   Assets  Assets:Communication Skills;Desire for Improvement;Financial Resources/Insurance;Leisure Time;Physical Health;Social Support;Resilience   Sleep  Sleep:Good  Number of hours: No data recorded  Physical Exam: Physical Exam Vitals and nursing note reviewed.  Constitutional:      General: She is not in acute distress.    Appearance: She is well-developed. She is not ill-appearing, toxic-appearing or diaphoretic.  HENT:     Head: Normocephalic and atraumatic.  Eyes:     General:        Right eye: No discharge.        Left eye: No discharge.     Conjunctiva/sclera: Conjunctivae normal.     Pupils: Pupils are equal, round, and reactive to light.  Cardiovascular:     Rate and Rhythm: Normal rate and regular rhythm.     Heart sounds: No murmur heard.   Pulmonary:     Effort: Pulmonary effort is normal. No respiratory distress.  Musculoskeletal:     Cervical back: Neck supple.  Skin:    General: Skin is warm and dry.  Neurological:     Mental Status: She is alert and oriented to person, place, and time.  Psychiatric:        Speech: Speech normal.        Thought Content: Thought content is not paranoid or delusional. Thought content does not include homicidal or suicidal ideation.    Review of Systems  Constitutional: Negative for chills, diaphoresis, fever, malaise/fatigue and weight loss.  Respiratory: Negative for cough and shortness of breath.   Cardiovascular: Negative for chest pain.  Gastrointestinal: Negative for diarrhea, nausea and vomiting.  Psychiatric/Behavioral: Positive for hallucinations. Negative for depression, memory loss, substance abuse and suicidal ideas. The patient is not nervous/anxious and does not have insomnia.    Blood pressure 114/71, pulse 72, temperature  98.3 F (36.8 C), resp. rate 18, SpO2 100 %, not currently breastfeeding. There is no height or weight on file to calculate  BMI.  Musculoskeletal: Strength & Muscle Tone: within normal limits Gait & Station: normal Patient leans: N/A   Recommendations:  Based on my evaluation the patient does not appear to have an emergency medical condition.   Disposition: No evidence of imminent risk to self or others at present.   Patient does not meet criteria for psychiatric inpatient admission. Supportive therapy provided about ongoing stressors. Discussed crisis plan, support from social network, calling 911, coming to the Emergency Department, and calling Suicide Hotline. Patient and her mother instructed to contact Dr. Lu Duffel in the morning to discuss her cahnge in condition since changeing Risperdal dosage/frequency. Instructed to take Risperdal tonight as prescribed. Patient and mother agree with plan.    Jackelyn Poling, NP 11/07/2019, 9:40 PM

## 2019-11-07 NOTE — Discharge Instructions (Addendum)
Please contact Dr. Hinton Dyer at [facility] tomorrow to discuss changes in condition since adjusting Risperdal on 11/01/2019. Number-[phone]

## 2019-11-07 NOTE — BH Assessment (Signed)
Comprehensive Clinical Assessment (CCA) Note  11/07/2019 Stephanie Cabrera [id number]  Pt is a 19 year old single woman who presents to [facility] accompanied by her mother, Kymberley Raz, who did not participate in assessment. Pt has a history of experiencing auditory hallucinations and was inpatient at [facility] in June 2021 and saw her psychiatrist, Dr. Benancio Deeds on 11/01/2019. Pt states she came to [facility] tonight because she is experiencing "epsiodes" which she describes as hearing voices. Pt says she cannot describe what the voices are saying. She says she is not hearing voices during assessment. She says her medication was recently increased and she wanted to know whether additional changes are necessary. She describes her mood as good. She denies problems with sleep or appetite. She denies current suicidal ideation or history of suicide attempts. She denies current thoughts of harming others or history of aggression. She denies visual hallucinations. She denies use of alcohol or other substances.  Pt cannot identify any stressors. She says she lives with her mother and Pt's one-year-old daughter. Pt says she has a very good relationship with her mother and that she has a very good support system. With Pt's permission, TTS spoke with Pt's mother. Pt's mother reports Pt appears to be responding to hallucinations a bit more frequently since the medication change. She says she has no concerns for Pt's safety and is comfortable taking Pt home. Neither Pt nor Pt's mother believe Pt needs inpatient psychiatric treatment at this time and are only interested in knowing if medication needs to be adjusted.  Pt is casually dressed and well-groomed. She alert and oriented x4. Pt speaks in a clear tone, at moderate volume and normal pace. Motor behavior appears normal. Eye contact is good. Pt's mood is euthymic and affect is congruent with mood. Thought process is coherent and relevant. There is no indication from  Pt's behavior that she is currently responding to internal stimuli or experiencing delusional thought content. Pt was pleasant and cooperative throughout assessment.   Visit Diagnosis:      ICD-10-CM   1. Brief psychotic disorder (HCC)  F23      DISPOSITION: Nira Conn, FNP completed MSE and recommended Pt contact Dr. Stanton Kidney office tomorrow and discuss concerns regarding medication. Pt does not present with any safety concerns and does not meet criteria for inpatient psychiatric treatment. Pt and Pt's mother are comfortable with Pt returning home and agree to contact Dr. Hinton Dyer tomorrow.  PHQ9 SCORE ONLY 11/07/2019 09/15/2019  PHQ-9 Total Score 0 8    CCA Screening, Triage and Referral (STR)  Patient Reported Information How did you hear about [place]? Self  Referral name: Pt presented to [facility]  Referral phone number: No data recorded  Whom do you see for routine medical problems? Other (Comment) (Dr. Hinton Dyer)  Practice/Facility Name: No data recorded Practice/Facility Phone Number: No data recorded Name of Contact: No data recorded Contact Number: No data recorded Contact Fax Number: No data recorded Prescriber Name: No data recorded Prescriber Address (if known): No data recorded  What Is the Reason for Your Visit/Call Today? Pt reports she is having "episodes" which consist of hearing voices.  How Long Has This Been Causing You Problems? 1-6 months  What Do You Feel Would Help You the Most Today? Medication   Have You Recently Been in Any Inpatient Treatment (Hospital/Detox/Crisis Center/28-Day Program)? Yes  Name/Location of Program/Hospital:[facility]  How Long Were You There? Four days  When Were You Discharged? 10/28/19   Have You Ever Received Services From  [place] Before? Yes  Who Do You See at [facility]? Dr. Hinton Dyer   Have You Recently Had Any Thoughts About Hurting Yourself? No  Are You Planning to Commit Suicide/Harm Yourself At This  time? No   Have you Recently Had Thoughts About Hurting Someone [name]? No  Explanation: No data recorded  Have You Used Any Alcohol or Drugs in the Past 24 Hours? No  How Long Ago Did You Use Drugs or Alcohol? No data recorded What Did You Use and How Much? No data recorded  Do You Currently Have a Therapist/Psychiatrist? Yes  Name of Therapist/Psychiatrist: Dr. Hinton Dyer   Have You Been Recently Discharged From Any Office Practice or Programs? No  Explanation of Discharge From Practice/Program: No data recorded    CCA Screening Triage Referral Assessment Type of Contact: Face-to-Face  Is this Initial or Reassessment? Initial Assessment  Date Telepsych consult ordered in [facility]:  10/14/19  Time Telepsych consult ordered in [facility]:  0521   Patient Reported Information Reviewed? Yes  Patient Left Without Being Seen? No data recorded Reason for Not Completing Assessment: No data recorded  Collateral Involvement: Mother: Jolie Strohecker   Does Patient Have a Court Appointed Legal Guardian? No data recorded Name and Contact of Legal Guardian: No data recorded If Minor and Not Living with Parent(s), Who has Custody? No data recorded Is CPS involved or ever been involved? Never  Is APS involved or ever been involved? Never   Patient Determined To Be At Risk for Harm To Self or Others Based on Review of Patient Reported Information or Presenting Complaint? No  Method: No data recorded Availability of Means: No data recorded Intent: No data recorded Notification Required: No data recorded Additional Information for Danger to Others Potential: No data recorded Additional Comments for Danger to Others Potential: No data recorded Are There Guns or Other Weapons in Your Home? No data recorded Types of Guns/Weapons: No data recorded Are These Weapons Safely Secured?                            No data recorded Who Could Verify You Are Able To Have These Secured: No data  recorded Do You Have any Outstanding Charges, Pending Court Dates, Parole/Probation? No data recorded Contacted To Inform of Risk of Harm To Self or Others: No data recorded  Location of Assessment: [facility] Assessment Services   Does Patient Present under Involuntary Commitment? No  IVC Papers Initial File Date: 09/22/19   [place] of Residence: [place]   Patient Currently Receiving the Following Services: Medication Management   Determination of Need: Routine (7 days)   Options For Referral: Medication Management     CCA Biopsychosocial  Intake/Chief Complaint:  CCA Intake With Chief Complaint CCA Part Two Date: 11/07/19 CCA Part Two Time: 2100 Chief Complaint/Presenting Problem: Pt reports she is having "episodes" and thinks her medications need adjustment Patient's Currently Reported Symptoms/Problems: Pt reports hearing voices she cannot describe Individual's Strengths: Pt is friendly and has good family support Individual's Preferences: None identified Individual's Abilities: NA Type of Services Patient Feels Are Needed: Medication evaluation Initial Clinical Notes/Concerns: NA  Mental Health Symptoms Depression:  Depression: Difficulty Concentrating  Mania:  Mania: None  Anxiety:   Anxiety: Difficulty concentrating  Psychosis:  Psychosis: Hallucinations  Trauma:  Trauma: None  Obsessions:  Obsessions: None  Compulsions:  Compulsions: None  Inattention:  Inattention: None  Hyperactivity/Impulsivity:  Hyperactivity/Impulsivity: N/A  Oppositional/Defiant Behaviors:  Oppositional/Defiant Behaviors: None  Emotional Irregularity:  Emotional Irregularity: None  Other Mood/Personality Symptoms:      Mental Status Exam Appearance and self-care  Stature:  Stature: Average  Weight:  Weight: Average weight  Clothing:  Clothing: Casual  Grooming:  Grooming: Well-groomed  Cosmetic use:  Cosmetic Use: Age appropriate  Posture/gait:  Posture/Gait: Normal  Motor  activity:  Motor Activity: Not Remarkable  Sensorium  Attention:  Attention: Normal  Concentration:  Concentration: Variable  Orientation:  Orientation: X5  Recall/memory:  Recall/Memory: Normal  Affect and Mood  Affect:  Affect: Appropriate  Mood:  Mood: Euthymic  Relating  Eye contact:  Eye Contact: Normal  Facial expression:  Facial Expression: Responsive  Attitude toward examiner:  Attitude Toward Examiner: Cooperative  Thought and Language  Speech flow: Speech Flow: Clear and Coherent  Thought content:  Thought Content: Appropriate to Mood and Circumstances  Preoccupation:  Preoccupations: Religion  Hallucinations:  Hallucinations: Auditory  Organization:     Company secretary of Knowledge:  Fund of Knowledge: Average  Intelligence:  Intelligence: Average  Abstraction:  Abstraction: Normal  Judgement:  Judgement: Fair  Dance movement psychotherapist:  Reality Testing: Adequate  Insight:  Insight: Fair  Decision Making:  Decision Making: Normal  Social Functioning  Social Maturity:  Social Maturity: Responsible  Social Judgement:  Social Judgement: Normal  Stress  Stressors:  Stressors: Other (Comment) (Auditory hallucinations)  Coping Ability:  Coping Ability: Normal  Skill Deficits:  Skill Deficits: None  Supports:  Supports: Family, Friends/Service system     Religion: Religion/Spirituality Are You A Religious Person?: Yes  Leisure/Recreation: Leisure / Recreation Do You Have Hobbies?: Yes Leisure and Hobbies: Badminton, swimming, cooking  Exercise/Diet: Exercise/Diet Do You Exercise?: Yes What Type of Exercise Do You Do?: Run/Walk, Swimming How Many Times a Week Do You Exercise?: 1-3 times a week Have You Gained or Lost A Significant Amount of Weight in the Past Six Months?: No Do You Follow a Special Diet?: No Do You Have Any Trouble Sleeping?: No   CCA Employment/Education  Employment/Work Situation: Employment / Work Situation Employment situation:  Unemployed Patient's job has been impacted by current illness: No What is the longest time patient has a held a job?: 20 months Where was the patient employed at that time?: [employer] Has patient ever been in the [employer]?: No  Education:     CCA Family/Childhood History  Family and Relationship History: Family history Marital status: Single Are you sexually active?: No What is your sexual orientation?: Heretosexual Has your sexual activity been affected by drugs, alcohol, medication, or emotional stress?: no Does patient have children?: Yes How many children?: 1 How is patient's relationship with their children?: patient has a one year old daughter and they are very close.  Childhood History:  Childhood History By whom was/is the patient raised?: Both parents, Grandparents Additional childhood history information: great Description of patient's relationship with caregiver when they were a child: amazing How were you disciplined when you got in trouble as a child/adolescent?: whippings Did patient suffer any verbal/emotional/physical/sexual abuse as a child?: No Did patient suffer from severe childhood neglect?: No Has patient ever been sexually abused/assaulted/raped as an adolescent or adult?: No Was the patient ever a victim of a crime or a disaster?: No Witnessed domestic violence?: No Has patient been affected by domestic violence as an adult?: No  Child/Adolescent Assessment:     CCA Substance Use  Alcohol/Drug Use: Alcohol / Drug Use Pain Medications: Denies abuse Prescriptions: Denies  abuse Over the Counter: Denies abuse History of alcohol / drug use?: No history of alcohol / drug abuse                         ASAM's:  Six Dimensions of Multidimensional Assessment  Dimension 1:  Acute Intoxication and/or Withdrawal Potential:      Dimension 2:  Biomedical Conditions and Complications:      Dimension 3:  Emotional, Behavioral, or Cognitive  Conditions and Complications:     Dimension 4:  Readiness to Change:     Dimension 5:  Relapse, Continued use, or Continued Problem Potential:     Dimension 6:  Recovery/Living Environment:     ASAM Severity Score:    ASAM Recommended Level of Treatment:     Substance use Disorder (SUD)    Recommendations for Services/Supports/Treatments:    DSM5 Diagnoses: Patient Active Problem List   Diagnosis Date Noted   Brief psychotic disorder (HCC) 09/24/2019    Patient Centered Plan: Patient is on the following Treatment Plan(s):     Referrals to Alternative Service(s): Referred to Alternative Service(s):   Place:   Date:   Time:    Referred to Alternative Service(s):   Place:   Date:   Time:    Referred to Alternative Service(s):   Place:   Date:   Time:    Referred to Alternative Service(s):   Place:   Date:   Time:     Pamalee Leyden, [facility] Triage Specialist [phone]  Patsy Baltimore, Harlin Rain

## 2019-11-08 ENCOUNTER — Telehealth (HOSPITAL_COMMUNITY): Payer: Self-pay | Admitting: *Deleted

## 2019-11-08 NOTE — Telephone Encounter (Signed)
Pt called c/o medication "not working" and is experiencing auditory hallucinations. Her next appointment is on 8/24. Do we need to get her in sooner? Please review and advise.

## 2019-11-08 NOTE — Telephone Encounter (Signed)
I think she needs to be a little more patient with psychiatric medications - just 6 days on a higher dose of risperidone. Still let's reschedule her appointment from 8/24 to next week.

## 2019-11-10 ENCOUNTER — Telehealth (HOSPITAL_COMMUNITY): Payer: Self-pay | Admitting: *Deleted

## 2019-11-10 NOTE — Telephone Encounter (Signed)
Writer returned pt call regarding increase in Risperdal. Previous attempts have been unsuccessful. Writer spoke with pt mother, [name], and explained that pt needs to give some time to adjust to medication. Writer also asked mother to schedule an earlier appointment; currently scheduled for 8/24. Mother declined and stated that she would call back later today to make appointment.

## 2019-11-15 ENCOUNTER — Telehealth (INDEPENDENT_AMBULATORY_CARE_PROVIDER_SITE_OTHER): Payer: BC Managed Care – PPO | Admitting: Family Medicine

## 2019-11-15 ENCOUNTER — Encounter: Payer: Self-pay | Admitting: Family Medicine

## 2019-11-15 ENCOUNTER — Telehealth: Payer: BC Managed Care – PPO | Admitting: Family Medicine

## 2019-11-15 DIAGNOSIS — F23 Brief psychotic disorder: Secondary | ICD-10-CM

## 2019-11-15 DIAGNOSIS — G40909 Epilepsy, unspecified, not intractable, without status epilepticus: Secondary | ICD-10-CM | POA: Diagnosis not present

## 2019-11-15 DIAGNOSIS — F2081 Schizophreniform disorder: Secondary | ICD-10-CM | POA: Diagnosis not present

## 2019-11-15 NOTE — Progress Notes (Signed)
Virtual Visit via Video Note I connected with Jesseca on 11/15/19 by a video enabled telemedicine application and verified that I am speaking with the correct person using two identifiers.  Location patient: home Location provider:work office Persons participating in the virtual visit: patient, her mother, and provider  I discussed the limitations of evaluation and management by telemedicine and the availability of in person appointments. The patient expressed understanding and agreed to proceed.  Chief Complaint  Patient presents with  . Follow-up   HPI: Corissa is a 19 yo female following on her last visit and recent ER visit. She was last seen on 09/15/19 because seizure like activity. She was evaluated by neuro, Dr Karel Jarvis. She has a pending EEG, mother is not sure about date of procedure.  She has not had similar episodes. She is asking if she can drive.  Since her last visit she has been in the ER a few times: 6/17 and 11/07/19. She was hospitalized from 09/24/19 and discharged on 09/28/19. Currently she is on Risperidone 4 mg daily.  She had a psychotic episode on 11/07/19. Established with psychiatrist, Dr Hinton Dyer, last seen on 11/01/19.  According to pt, she was not told to follow, just to call if any problem with medication. She is concerned about taking this mediation because she is breast feeding.  In general she and her mother feel like symptoms are better controlled. Suicidal thought on 11/12/19. She called suicidal line and talked with counselor. She denies having any suicidal plan or ideation. She is living with her mother.  ROS: See pertinent positives and negatives per HPI.  Past Medical History:  Diagnosis Date  . Allergy   . Medical history non-contributory   . Seizures (HCC)     Past Surgical History:  Procedure Laterality Date  . NO PAST SURGERIES      Family History  Problem Relation Age of Onset  . Diabetes Paternal Grandmother   . Diabetes Paternal  Grandfather   . Hypertension Paternal Grandfather     Social History   Socioeconomic History  . Marital status: Single    Spouse name: Not on file  . Number of children: 1  . Years of education: Not on file  . Highest education level: Not on file  Occupational History  . Not on file  Tobacco Use  . Smoking status: Never Smoker  . Smokeless tobacco: Never Used  Vaping Use  . Vaping Use: Never used  Substance and Sexual Activity  . Alcohol use: Never    Alcohol/week: 0.0 standard drinks  . Drug use: Never  . Sexual activity: Not Currently    Birth control/protection: None  Other Topics Concern  . Not on file  Social History Narrative   Right Handed   One Story    Lives with mom and daughter   No Caffeine   Social Determinants of Health   Financial Resource Strain:   . Difficulty of Paying Living Expenses:   Food Insecurity:   . Worried About Programme researcher, broadcasting/film/video in the Last Year:   . Barista in the Last Year:   Transportation Needs:   . Freight forwarder (Medical):   Marland Kitchen Lack of Transportation (Non-Medical):   Physical Activity:   . Days of Exercise per Week:   . Minutes of Exercise per Session:   Stress:   . Feeling of Stress :   Social Connections:   . Frequency of Communication with Friends and Family:   . Frequency  of Social Gatherings with Friends and Family:   . Attends Religious Services:   . Active Member of Clubs or Organizations:   . Attends Banker Meetings:   Marland Kitchen Marital Status:   Intimate Partner Violence:   . Fear of Current or Ex-Partner:   . Emotionally Abused:   Marland Kitchen Physically Abused:   . Sexually Abused:     Current Outpatient Medications:  .  risperiDONE (RISPERDAL) 4 MG tablet, Take 1 tablet (4 mg total) by mouth at bedtime., Disp: 30 tablet, Rfl: 0  EXAM:  VITALS per patient if applicable:N/A  GENERAL: alert, oriented, appears well and in no acute distress  HEENT: atraumatic, conjunctiva clear, no obvious  abnormalities on inspection.  NECK: normal movements of the head and neck  LUNGS: on inspection no signs of respiratory distress, breathing rate appears normal, no obvious gross SOB, gasping or wheezing  CV: no obvious cyanosis  PSYCH/NEURO: pleasant and cooperative, no obvious depression or anxiety, speech and thought processing grossly intact  ASSESSMENT AND PLAN:  Discussed the following assessment and plan:  Seizure disorder [facility]) Information about EEG date given. Recommend avoiding driving until 65/7846, 6 months after seizure like episode unless Dr Karel Jarvis says otherwise. Continue following with neuro.  Brief psychotic disorder [facility]) She was instructed to follow in 4 weeks, she has an appt with psychiatrist on 11/30/19, information given. We discussed some side effects of Risperidone, I do not think it will cause a serious process if she is breastfeeding. We also discussed possible complications of psychiatric disorder, so at this time I think benefits is greater than risk. Recommend also discussing this concern with her psychiatrist. Clearly instructed about warning signs.  I discussed the assessment and treatment plan with the patient. Dorien was provided an opportunity to ask questions and all were answered. She and her mother agreed with the plan and demonstrated an understanding of the instructions.   No follow-ups on file.    Oluchi Pucci [place], MD

## 2019-11-19 ENCOUNTER — Telehealth: Payer: Self-pay | Admitting: Family Medicine

## 2019-11-19 NOTE — Telephone Encounter (Signed)
FMLA paperwork to be filled out-- placed in dr's folder.  Call [phone] upon completion.

## 2019-11-22 DIAGNOSIS — Z0279 Encounter for issue of other medical certificate: Secondary | ICD-10-CM

## 2019-11-22 NOTE — Telephone Encounter (Signed)
Form received

## 2019-11-22 NOTE — Telephone Encounter (Signed)
Form placed on pcp's desk to be signed.  

## 2019-11-23 NOTE — Telephone Encounter (Signed)
Patient's mom is aware that paperwork is completed & up front for pick up.

## 2019-11-24 ENCOUNTER — Other Ambulatory Visit: Payer: Self-pay

## 2019-11-24 ENCOUNTER — Ambulatory Visit (INDEPENDENT_AMBULATORY_CARE_PROVIDER_SITE_OTHER): Payer: BC Managed Care – PPO | Admitting: Neurology

## 2019-11-24 DIAGNOSIS — R569 Unspecified convulsions: Secondary | ICD-10-CM

## 2019-11-24 DIAGNOSIS — R413 Other amnesia: Secondary | ICD-10-CM

## 2019-11-24 DIAGNOSIS — F2081 Schizophreniform disorder: Secondary | ICD-10-CM | POA: Diagnosis not present

## 2019-11-24 DIAGNOSIS — R440 Auditory hallucinations: Secondary | ICD-10-CM

## 2019-11-25 ENCOUNTER — Telehealth: Payer: Self-pay | Admitting: Family Medicine

## 2019-11-25 NOTE — Telephone Encounter (Signed)
Pt mother said papers can be faxed instead of picking them up. She attached a card with the number to the documents  FMLA documents  Please call her at [phone]  Please advise

## 2019-11-26 ENCOUNTER — Telehealth: Payer: Self-pay | Admitting: Neurology

## 2019-11-26 NOTE — Procedures (Signed)
ELECTROENCEPHALOGRAM REPORT  Date of Study: 11/24/2019  Patient's Name: Stephanie Cabrera MRN: [id number] Date of Birth: 2000-10-21  Referring Provider: Dr. Patrcia Dolly  Clinical History: This is a 19 year old woman with an episode concerning for seizure in May, followed by a brief psychotic episode 3 weeks later, as well as auditory hallucinations.   Medications: Risperdal  Technical Summary: A multichannel digital 1-hour EEG recording measured by the international 10-20 system with electrodes applied with paste and impedances below 5000 ohms performed in our laboratory with EKG monitoring in an awake and asleep patient.  Hyperventilation was not performed. Photic stimulation was performed.  The digital EEG was referentially recorded, reformatted, and digitally filtered in a variety of bipolar and referential montages for optimal display.    Description: The patient is awake and asleep during the recording.  During maximal wakefulness, there is no clear posterior dominant rhythm seen. Although there is possible electrode artifact in the right posterior temporal occipital region, there is also note of frequent focal theta and delta slowing over the right temporal region. There is independent occasional focal theta and delta slowing over the left temporal region. There are periods of frontal intermittent rhythmic delta activity (FIRDA) lasting 1-3 seconds, more during drowsiness and sleep. During drowsiness and sleep, there is an increase in theta slowing of the background with vertex waves seen. Photic stimulation did not elicit any abnormalities.  There were no epileptiform discharges or electrographic seizures seen.    EKG lead was unremarkable.  Impression: This 1-hour awake and asleep EEG is abnormal due to the presence of: 1. Independent focal slowing over the bilateral temporal regions, right greater than left 2. Frontal intermittent rhythmic delta activity (FIRDA)  Clinical  Correlation of the above findings indicates focal cerebral dysfunction over the bilateral temporal regions suggestive of underlying structural or physiologic abnormality. MRI brain recommended. FIRDA is a nonspecific pattern that may indicate either focal or diffuse abnormality that can be metabolic, degenerative, or endogenous dysfunction (ie migraine). The absence of epileptiform discharges does not exclude a clinical diagnosis of epilepsy.  If further clinical questions remain, prolonged EEG may be helpful.  Clinical correlation is advised.   Patrcia Dolly, M.D.

## 2019-11-26 NOTE — Telephone Encounter (Signed)
Spoke with pt's mom, paperwork has been faxed.

## 2019-11-26 NOTE — Telephone Encounter (Signed)
Left VM on mother's phone to discuss EEG results. Will need to do MRI brain and recommend 24-hour EEG

## 2019-11-26 NOTE — Telephone Encounter (Signed)
Spoke to mother about EEG findings. She is overall doing ok except for her memory. No further hallucinations, no staring/unresponsive episodes. She mostly sleeps possibly due to Risperdal. Discussed doing further evaluation with 24-hour EEG and proceed with MRI brain with and without contrast.  Stephanie Cabrera, pls order Ambulatory EEG (24-hour) and MRI brain with and without contrast (pls have them call mother [name] for them to schedule test, she has not heard anything from them). Thanks!

## 2019-11-29 ENCOUNTER — Other Ambulatory Visit: Payer: Self-pay | Admitting: Neurology

## 2019-11-29 DIAGNOSIS — R569 Unspecified convulsions: Secondary | ICD-10-CM

## 2019-11-29 DIAGNOSIS — R440 Auditory hallucinations: Secondary | ICD-10-CM

## 2019-11-30 ENCOUNTER — Telehealth (INDEPENDENT_AMBULATORY_CARE_PROVIDER_SITE_OTHER): Payer: BC Managed Care – PPO | Admitting: Psychiatry

## 2019-11-30 ENCOUNTER — Other Ambulatory Visit: Payer: Self-pay

## 2019-11-30 DIAGNOSIS — F23 Brief psychotic disorder: Secondary | ICD-10-CM

## 2019-11-30 MED ORDER — RISPERIDONE 3 MG PO TABS: 3.0000 mg | ORAL_TABLET | Freq: Every day | ORAL | 0 refills | Status: DC

## 2019-11-30 NOTE — Progress Notes (Signed)
BH MD/PA/NP OP Progress Note  11/30/2019 1:41 PM Stephanie Cabrera  MRN:  [id number] Interview was conducted using videoconferencing application and I verified that I was speaking with the correct person using two identifiers. I discussed the limitations of evaluation and management by telemedicine and  the availability of in person appointments. Patient expressed understanding and agreed to proceed. Patient location - home; physician - home office.  Chief Complaint: "I feel sleepy and tired".  HPI: 19 yo AAF who comes for her first post-hospitalization visit. She was recently hospitalized at [facility] for psychotic break. She started to experience hallucinations about 2 months ago, felt confused, having memory problems and per IVC papers started to destroy furniture/TV in her room. She denies ever before having similar experiences (confirmed by her mother). AH appeared suddenly; she denies any new medications, any sudden stress, she does not abuse alcohol or drugs. The only recent potentially stressful event is giving birth to her fists child Stephanie Cabrera (on 09/06/19).  There is no psychiatric or substance abuse hx in her biological family. While in [facility] she was started on risperidone 1 mg in AM and 2 at bedtime. AH subsided but not resolved 0 she describes these as a single female, unknown voice which at times woul tell her to do things like get on bed etc. It does not command her to harm self, child or others. It comes and goes but is still present on daily basis. She feels somewhat sedated after taking morning dose of risperidone. She denies having tremor, muscle twitching, difficulty swallowing. There is perhaps some increase in appetite observed. Her mood is neutral. She does not verbalize any paranoia or other delusional thought content. There is no hx of mania, depression but she admits to some anxiety predating current episode. She has a remote (middle school) hx of cutting. This was her first psychiatric  admission. Chart review indicates that about 2 months ago she had a seizure like episode - none before or since. She stopped working Facilities manager job). Head CT done on 09/23/19 was normal but EEG done by Dr. Karel Jarvis was not. Stephanie Cabrera will now have head MRI. She denies having any hallucinations since risperidone was increase to 4 mg but feels tired/sleepy during the day. Her memory  also is "not good".  She has no EPS (no tremor, twitching, dysphagia). She occasionally nurses her daughter but is trying to wean her off.   Visit Diagnosis:    ICD-10-CM   1. Brief psychotic disorder (HCC)  F23     Past Psychiatric History: Please see intake H&P.  Past Medical History:  Past Medical History:  Diagnosis Date  . Allergy   . Medical history non-contributory   . Seizures (HCC)     Past Surgical History:  Procedure Laterality Date  . NO PAST SURGERIES      Family Psychiatric History: None.  Family History:  Family History  Problem Relation Age of Onset  . Diabetes Paternal Grandmother   . Diabetes Paternal Grandfather   . Hypertension Paternal Grandfather     Social History:  Social History   Socioeconomic History  . Marital status: Single    Spouse name: Not on file  . Number of children: 1  . Years of education: Not on file  . Highest education level: Not on file  Occupational History  . Not on file  Tobacco Use  . Smoking status: Never Smoker  . Smokeless tobacco: Never Used  Vaping Use  . Vaping Use: Never used  Substance and Sexual Activity  . Alcohol use: Never    Alcohol/week: 0.0 standard drinks  . Drug use: Never  . Sexual activity: Not Currently    Birth control/protection: None  Other Topics Concern  . Not on file  Social History Narrative   Right Handed   One Story    Lives with mom and daughter   No Caffeine   Social Determinants of Health   Financial Resource Strain:   . Difficulty of Paying Living Expenses: Not on file  Food Insecurity:   . Worried  About Programme researcher, broadcasting/film/video in the Last Year: Not on file  . Ran Out of Food in the Last Year: Not on file  Transportation Needs:   . Lack of Transportation (Medical): Not on file  . Lack of Transportation (Non-Medical): Not on file  Physical Activity:   . Days of Exercise per Week: Not on file  . Minutes of Exercise per Session: Not on file  Stress:   . Feeling of Stress : Not on file  Social Connections:   . Frequency of Communication with Friends and Family: Not on file  . Frequency of Social Gatherings with Friends and Family: Not on file  . Attends Religious Services: Not on file  . Active Member of Clubs or Organizations: Not on file  . Attends Banker Meetings: Not on file  . Marital Status: Not on file    Allergies: No Known Allergies  Metabolic Disorder Labs: Lab Results  Component Value Date   HGBA1C 5.3 09/25/2019   MPG 105.41 09/25/2019   No results found for: PROLACTIN Lab Results  Component Value Date   CHOL 183 09/25/2019   TRIG 60 09/25/2019   HDL 67 09/25/2019   CHOLHDL 2.7 09/25/2019   VLDL 12 09/25/2019   LDLCALC 104 (H) 09/25/2019   Lab Results  Component Value Date   TSH 1.195 09/25/2019   TSH 1.479 09/23/2019    Therapeutic Level Labs: No results found for: LITHIUM No results found for: VALPROATE No components found for:  CBMZ  Current Medications: Current Outpatient Medications  Medication Sig Dispense Refill  . risperidone (RISPERDAL) 3 MG tablet Take 1 tablet (3 mg total) by mouth at bedtime. 30 tablet 0   No current facility-administered medications for this visit.     Psychiatric Specialty Exam: Review of Systems  Constitutional: Positive for fatigue.  All other systems reviewed and are negative.   not currently breastfeeding.There is no height or weight on file to calculate BMI.  General Appearance: Casual and Well Groomed  Eye Contact:  Good  Speech:  Clear and Coherent and Normal Rate  Volume:  Normal  Mood:   Euthymic  Affect:  Restricted  Thought Process:  Goal Directed  Orientation:  Full (Time, Place, and Person)  Thought Content: Logical   Suicidal Thoughts:  No  Homicidal Thoughts:  No  Memory:  Immediate;   Fair Recent;   Fair Remote;   Fair  Judgement:  Good  Insight:  Fair  Psychomotor Activity:  Normal  Concentration:  Concentration: Fair  Recall:  Fair  Fund of Knowledge: Good  Language: Good  Akathisia:  Negative  Handed:  Right  AIMS (if indicated): not done  Assets:  Communication Skills Desire for Improvement Financial Resources/Insurance Housing Social Support  ADL's:  Intact  Cognition: WNL  Sleep:  Good   Screenings: AIMS     [facility]  AIMS  Total Score 0    AUDIT     [facility]  Alcohol Use Disorder Identification Test Final Score (AUDIT) 0    GAD-7     Office Visit from 09/15/2019 in [facility] [place]  Total GAD-7 Score 6    PHQ2-9     ED from 11/07/2019 in [facility] Office Visit from 09/15/2019 in [facility] [place]  PHQ-2 Total Score 0 2  PHQ-9 Total Score 0 8       Assessment and Plan: 19 yo AAF who comes for her first post-hospitalization visit. She was recently (7/19-22/21) hospitalized at [facility] for psychotic break. She started to experience hallucinations about 2 months ago, felt confused, having memory problems and per IVC papers started to destroy furniture/TV in her room. She denies ever before having similar experiences (confirmed by her mother). AH appeared suddenly; she denies any new medications, any sudden stress, she does not abuse alcohol or drugs. The only recent potentially stressful event is giving birth to her fists child Stephanie Cabrera (on 09/06/19).  There is no psychiatric or substance abuse hx in her biological family. While in [facility] she  was started on risperidone 1 mg in AM and 2 at bedtime. AH subsided but not resolved 0 she describes these as a single female, unknown voice which at times woul tell her to do things like get on bed etc. It does not command her to harm self, child or others. It comes and goes but is still present on daily basis. She feels somewhat sedated after taking morning dose of risperidone. She denies having tremor, muscle twitching, difficulty swallowing. There is perhaps some increase in appetite observed. Her mood is neutral. She does not verbalize any paranoia or other delusional thought content. There is no hx of mania, depression but she admits to some anxiety predating current episode. She has a remote (middle school) hx of cutting. This was her first psychiatric admission. Chart review indicates that about 2 months ago she had a seizure like episode - none before or since. She stopped working Facilities manager job). Head CT done on 09/23/19 was normal but EEG done by Dr. Karel Jarvis was not. Anastacia will now have head MRI. She denies having any hallucinations since risperidone was increase to 4 mg but feels tired/sleepy during the day. Her memory  also is "not good".  She has no EPS (no tremor, twitching, dysphagia). She occasionally nurses her daughter but is trying to wean her off.  Dx: Brief psychotic disorder   Plan: I will gradually taper risperidone off: at this time we will decrease dose to 3 mg at HS and next month to 2 mg unless AH return. Next appointment in 4 weeks. The plan was discussed with patient who had an opportunity to ask questions and these were all answered. I spend 15 minutes in videoconferencing with the patient ( and mother).    Magdalene Patricia, MD 11/30/2019, 1:41 PM

## 2019-12-03 NOTE — Telephone Encounter (Signed)
Called and left a message for a call back. Orders have been put in. Calling patients mother to provide contact information for MRI.

## 2019-12-06 ENCOUNTER — Other Ambulatory Visit: Payer: Self-pay

## 2019-12-06 ENCOUNTER — Ambulatory Visit (INDEPENDENT_AMBULATORY_CARE_PROVIDER_SITE_OTHER): Payer: BC Managed Care – PPO | Admitting: Neurology

## 2019-12-06 ENCOUNTER — Telehealth: Payer: Self-pay | Admitting: Neurology

## 2019-12-06 DIAGNOSIS — R569 Unspecified convulsions: Secondary | ICD-10-CM

## 2019-12-06 DIAGNOSIS — R440 Auditory hallucinations: Secondary | ICD-10-CM

## 2019-12-06 NOTE — Telephone Encounter (Signed)
Patient's mom called to ask if it is normal to get headaches after having the EEG equipment hooked up? Please call

## 2019-12-09 NOTE — Telephone Encounter (Signed)
Patient's mother [name] called in and left a message. She wanted to find out the results of the patient's EEG.

## 2019-12-10 NOTE — Telephone Encounter (Signed)
Left VM for mother Stephanie Cabrera about EEG results. There were no epileptiform discharges seen, however the slowing on bilateral temporal regions R>L is again seen, proceed with MRI brain to assess for underlying structural abnormality. Asked her to call back if any other questions.

## 2019-12-14 NOTE — Procedures (Signed)
ELECTROENCEPHALOGRAM REPORT  Dates of Recording: 12/06/2019 8:01AM to 12/07/2019 8:12AM  Patient's Name: Stephanie Cabrera MRN: [id number] Date of Birth: 07-31-2000  Referring Provider: Dr. Patrcia Dolly  Procedure: 24-hour ambulatory video EEG  History: This is a 19 year old woman with new onset seizure followed by confusion/psychotic episode, auditory hallucinations. EEG for classification.  Medications: Risperdal  Technical Summary: This is a 24-hour multichannel digital video EEG recording measured by the international 10-20 system with electrodes applied with paste and impedances below 5000 ohms performed as portable with EKG monitoring.  The digital EEG was referentially recorded, reformatted, and digitally filtered in a variety of bipolar and referential montages for optimal display.    DESCRIPTION OF RECORDING: During maximal wakefulness, there is no clear posterior dominant rhythm seen. There is diffuse low voltage beta activity. There is frequent polymorphic focal theta and delta slowing over the right temporal region, at time for prolonged periods without evolution in frequency or amplitude. There is independent occasional focal theta and delta slowing seen over the left temporal region. There were no epileptiform discharges or focal slowing seen in wakefulness.  During the recording, the patient progresses through wakefulness, drowsiness, and Stage 2 sleep. Similar focal slowing is seen over the right greater than left temporal regions.  Again, there were no epileptiform discharges seen.  Events: On 8/30 at 0911 hours, she has a headache. Patient not on video. Electrographically, there were no EEG or EKG changes seen.  On 8/30 at 0930 hours, she is crying. Patient not on video. Electrographically, there were no EEG or EKG changes seen.   On 8/30 at 0950 hours, she has a headache. Patient not on video. Electrographically, there were no EEG or EKG changes seen.  On 8/30 at  1130 and 1139 hours, she has a sharp pain on the left side, headache. Patient not on video. Electrographically, there were no EEG or EKG changes seen.  On 8/30 at 1230 hours, she has a crying moment. Patient not on video. Electrographically, there were no EEG or EKG changes seen.  On 8/30 at 1923 hours, she has a headache. Patient not on video. Electrographically, there were no EEG or EKG changes seen.   There were no electrographic seizures seen.  EKG lead was unremarkable.  IMPRESSION: This 24-hour ambulatory video EEG study is abnormal due to the presence of independent focal slowing over the bilateral temporal regions, right greater than left.  CLINICAL CORRELATION of the above findings indicates focal cerebral dysfunction over the bilateral temporal regions suggestive of underlying structural or physiologic abnormality. The absence of epileptiform discharges does not exclude a clinical diagnosis of epilepsy. Episodes of headache, crying did not show any electrographic correlate.  If further clinical questions remain, inpatient video EEG monitoring may be helpful.   Patrcia Dolly, M.D.

## 2019-12-17 ENCOUNTER — Telehealth: Payer: Self-pay

## 2019-12-17 NOTE — Telephone Encounter (Signed)
Pt mother called asking about EEG results she was informed Per Dr Karel Jarvis no seizure activity seen, but the same slowing is seen more on the right side, and this is why we should do an MRI brain

## 2019-12-17 NOTE — Telephone Encounter (Signed)
Spoke to pt mother gave her the number to [facility] so that they can get MRI scheduled

## 2019-12-24 ENCOUNTER — Ambulatory Visit: Payer: BC Managed Care – PPO | Admitting: Neurology

## 2019-12-31 ENCOUNTER — Telehealth (INDEPENDENT_AMBULATORY_CARE_PROVIDER_SITE_OTHER): Payer: BC Managed Care – PPO | Admitting: Psychiatry

## 2019-12-31 ENCOUNTER — Other Ambulatory Visit: Payer: Self-pay

## 2019-12-31 DIAGNOSIS — F23 Brief psychotic disorder: Secondary | ICD-10-CM

## 2019-12-31 MED ORDER — RISPERIDONE 2 MG PO TABS: 2.0000 mg | ORAL_TABLET | Freq: Every day | ORAL | 0 refills | Status: DC

## 2019-12-31 NOTE — Progress Notes (Signed)
BH MD/PA/NP OP Progress Note  12/31/2019 10:38 AM Stephanie Cabrera  MRN:  [id number] Interview was conducted using videoconferencing application and I verified that I was speaking with the correct person using two identifiers. I discussed the limitations of evaluation and management by telemedicine and  the availability of in person appointments. Patient expressed understanding and agreed to proceed. Patient location - home; physician - home office.  Chief Complaint: None.  HPI: 19 yo AAF who comes for her first post-hospitalization visit. She was recently (7/19-22/21) hospitalized at [facility] for psychotic break. She started to experience hallucinations about 2 months ago, felt confused, having memory problems and per IVC papers started to destroy furniture/TV in her room. She denies ever before having similar experiences (confirmed by her mother). AH appeared suddenly; she denies any new medications, any sudden stress, she does not abuse alcohol or drugs. The only recent potentially stressful event is giving birth to her fists child Stephanie Cabrera (on 09/06/19). There is no psychiatric or substance abuse hx in her biological family. While in [facility] she was started on risperidone 1 mg in AM and 2 at bedtime. AH subsided but not resolved 0 she describes these as a single female, unknown voice which at times woul tell her to do things like get on bed etc. It does not command her to harm self, child or others. It comes and goes but is still present on daily basis. She feels somewhat sedated after taking morning dose of risperidone. She denies having tremor, muscle twitching, difficulty swallowing. There is perhaps some increase in appetite observed. Her mood is neutral. She does not verbalize any paranoia or other delusional thought content. There is no hx of mania, depression but she admits to some anxiety predating current episode. She has a remote (middle school) hx of cutting. This was her first psychiatric admission.  Chart review indicates that about 2 months ago she had a seizure like episode - none before or since. She stopped working Facilities manager job). Head CT done on 09/23/19 was normal but EEG done by Dr. Karel Jarvis was not. [name] will now have head MRI on October 1st. She denies having any hallucinations since risperidone was decrease to 3 mg at HS. She feels less tired and has no EPS (no tremor, twitching, dysphagia). .    Visit Diagnosis:    ICD-10-CM   1. Brief psychotic disorder (HCC)  F23     Past Psychiatric History: Please see intake H&P.  Past Medical History:  Past Medical History:  Diagnosis Date  . Allergy   . Medical history non-contributory   . Seizures (HCC)     Past Surgical History:  Procedure Laterality Date  . NO PAST SURGERIES      Family Psychiatric History: None.  Family History:  Family History  Problem Relation Age of Onset  . Diabetes Paternal Grandmother   . Diabetes Paternal Grandfather   . Hypertension Paternal Grandfather     Social History:  Social History   Socioeconomic History  . Marital status: Single    Spouse name: Not on file  . Number of children: 1  . Years of education: Not on file  . Highest education level: Not on file  Occupational History  . Not on file  Tobacco Use  . Smoking status: Never Smoker  . Smokeless tobacco: Never Used  Vaping Use  . Vaping Use: Never used  Substance and Sexual Activity  . Alcohol use: Never    Alcohol/week: 0.0 standard drinks  . Drug  use: Never  . Sexual activity: Not Currently    Birth control/protection: None  Other Topics Concern  . Not on file  Social History Narrative   Right Handed   One Story    Lives with mom and daughter   No Caffeine   Social Determinants of Health   Financial Resource Strain:   . Difficulty of Paying Living Expenses: Not on file  Food Insecurity:   . Worried About Programme researcher, broadcasting/film/video in the Last Year: Not on file  . Ran Out of Food in the Last Year: Not on  file  Transportation Needs:   . Lack of Transportation (Medical): Not on file  . Lack of Transportation (Non-Medical): Not on file  Physical Activity:   . Days of Exercise per Week: Not on file  . Minutes of Exercise per Session: Not on file  Stress:   . Feeling of Stress : Not on file  Social Connections:   . Frequency of Communication with Friends and Family: Not on file  . Frequency of Social Gatherings with Friends and Family: Not on file  . Attends Religious Services: Not on file  . Active Member of Clubs or Organizations: Not on file  . Attends Banker Meetings: Not on file  . Marital Status: Not on file    Allergies: No Known Allergies  Metabolic Disorder Labs: Lab Results  Component Value Date   HGBA1C 5.3 09/25/2019   MPG 105.41 09/25/2019   No results found for: PROLACTIN Lab Results  Component Value Date   CHOL 183 09/25/2019   TRIG 60 09/25/2019   HDL 67 09/25/2019   CHOLHDL 2.7 09/25/2019   VLDL 12 09/25/2019   LDLCALC 104 (H) 09/25/2019   Lab Results  Component Value Date   TSH 1.195 09/25/2019   TSH 1.479 09/23/2019    Therapeutic Level Labs: No results found for: LITHIUM No results found for: VALPROATE No components found for:  CBMZ  Current Medications: Current Outpatient Medications  Medication Sig Dispense Refill  . risperiDONE (RISPERDAL) 2 MG tablet Take 1 tablet (2 mg total) by mouth at bedtime. 30 tablet 0   No current facility-administered medications for this visit.      Psychiatric Specialty Exam: Review of Systems  All other systems reviewed and are negative.   not currently breastfeeding.There is no height or weight on file to calculate BMI.  General Appearance: Casual and Well Groomed  Eye Contact:  Good  Speech:  Clear and Coherent and Normal Rate  Volume:  Normal  Mood:  Euthymic  Affect:  Full Range  Thought Process:  Goal Directed  Orientation:  Full (Time, Place, and Person)  Thought Content: Logical    Suicidal Thoughts:  No  Homicidal Thoughts:  No  Memory:  Immediate;   Good Recent;   Good Remote;   Good  Judgement:  Good  Insight:  Good  Psychomotor Activity:  Normal  Concentration:  Concentration: Good  Recall:  Good  Fund of Knowledge: Good  Language: Good  Akathisia:  Negative  Handed:  Right  AIMS (if indicated): not done  Assets:  Communication Skills Desire for Improvement Financial Resources/Insurance Housing Social Support Talents/Skills  ADL's:  Intact  Cognition: WNL  Sleep:  Good   Screenings: AIMS     [facility]  AIMS Total Score 0    AUDIT     [facility]  Alcohol Use Disorder Identification Test Final Score (AUDIT) 0    GAD-7     Office Visit from 09/15/2019 in [facility] [place]  Total GAD-7 Score 6    PHQ2-9     ED from 11/07/2019 in [facility] Office Visit from 09/15/2019 in [facility] [place]  PHQ-2 Total Score 0 2  PHQ-9 Total Score 0 8       Assessment and Plan: 19 yo AAF who comes for her first post-hospitalization visit. She was recently (7/19-22/21) hospitalized at [facility] for psychotic break. She started to experience hallucinations about 2 months ago, felt confused, having memory problems and per IVC papers started to destroy furniture/TV in her room. She denies ever before having similar experiences (confirmed by her mother). AH appeared suddenly; she denies any new medications, any sudden stress, she does not abuse alcohol or drugs. The only recent potentially stressful event is giving birth to her fists child Stephanie Cabrera (on 09/06/19). There is no psychiatric or substance abuse hx in her biological family. While in [facility] she was started on risperidone 1 mg in AM and 2 at bedtime. AH subsided but not resolved 0 she describes these as a single female,  unknown voice which at times woul tell her to do things like get on bed etc. It does not command her to harm self, child or others. It comes and goes but is still present on daily basis. She feels somewhat sedated after taking morning dose of risperidone. She denies having tremor, muscle twitching, difficulty swallowing. There is perhaps some increase in appetite observed. Her mood is neutral. She does not verbalize any paranoia or other delusional thought content. There is no hx of mania, depression but she admits to some anxiety predating current episode. She has a remote (middle school) hx of cutting. This was her first psychiatric admission. Chart review indicates that about 2 months ago she had a seizure like episode - none before or since. She stopped working Facilities manager job). Head CT done on 09/23/19 was normal but EEG done by Dr. Karel Jarvis was not. [facility] will now have head MRI on October 1st. She denies having any hallucinations since risperidone was decrease to 3 mg at HS. She feels less tired and has no EPS (no tremor, twitching, dysphagia). .  Dx: Brief psychotic disorder   Plan: I will gradually taper risperidone off: at this time we will decrease dose to 2 mg at HS and next month to 1 mg unless AH return. Next appointment in 4 weeks.The plan was discussed with patient who had an opportunity to ask questions and these were all answered. I spend 15 minutes invideoconferencingwith the patient( and mother).   Magdalene Patricia, MD 12/31/2019, 10:38 AM

## 2020-01-01 ENCOUNTER — Other Ambulatory Visit: Payer: BC Managed Care – PPO

## 2020-01-01 DIAGNOSIS — J069 Acute upper respiratory infection, unspecified: Secondary | ICD-10-CM | POA: Diagnosis not present

## 2020-01-01 DIAGNOSIS — R05 Cough: Secondary | ICD-10-CM | POA: Diagnosis not present

## 2020-01-01 DIAGNOSIS — Z20822 Contact with and (suspected) exposure to covid-19: Secondary | ICD-10-CM | POA: Diagnosis not present

## 2020-01-02 ENCOUNTER — Ambulatory Visit (HOSPITAL_COMMUNITY)
Admission: EM | Admit: 2020-01-02 | Discharge: 2020-01-03 | Disposition: A | Discharge status: Home / Self Care | Payer: BC Managed Care – PPO

## 2020-01-02 ENCOUNTER — Other Ambulatory Visit: Payer: Self-pay

## 2020-01-02 DIAGNOSIS — F23 Brief psychotic disorder: Secondary | ICD-10-CM | POA: Diagnosis not present

## 2020-01-02 NOTE — ED Provider Notes (Signed)
[facility]al [facility] Medical Screening Exam  Patient Name: Stephanie Cabrera MRN: [id number] Date of Evaluation: 01/02/20 Chief Complaint: Having Episodes Diagnosis: Brief Psychotic disorder  History of Present illness: Stephanie Cabrera is a 19 y.o. female who presents voluntarily to [facility] with her mother due to an increase in auditory hallucinations. The patient is voicing that God is speaking to her. She was asked what is he saying to her? She states, "it's nothing bad. God will never tell me to do anything bad." The patient was seen on 11/01/2019 which at that time, her Risperdal was changed to QHS. The patient was admitted to [facility] 09/24/2019-09/28/2019. At that time, she presented with some behavior problems and was petitioned for IVC. On today's visit, she is calm, cooperative and her mood is congruent. With collaboration from the patient's mom, TTS reported that they have recently tried to taper the patient off her Risperdal. Mom voiced that the patient does not want to continue the Risperdal, and the patient has been taper off from 4 mg at HS to 3 mg. [name] discussed that she(mom) is seeing the changes in the patient. Mom voiced that she will have the patient being seen by her provider in the morning who prescribed the patient Risperdal. During the patient assessment, she is alert and oriented x 4, pleasant, and cooperative. The patient does not appear to be responding to internal or external stimuli. She voiced that she is not currently experiencing auditory hallucinations. Neither is the patient presenting with any delusional thinking. The patient denies auditory or visual hallucinations. The patient denies any suicidal, homicidal, or self-harm ideations. The patient is presenting with an acute psychotic disorder but no paranoid behaviors. During an encounter with the patient, she was able to answer questions appropria  Psychiatric Specialty Exam  Presentation   General Appearance:Appropriate for Environment  Eye Contact:Good  Speech:Clear and Coherent  Speech Volume:Normal  Handedness:Right   Mood and Affect  Mood:Euthymic  Affect:Congruent;Appropriate   Thought Process  Thought Processes:Coherent  Descriptions of Associations:Intact  Orientation:Full (Time, Place and Person)  Thought Content:Logical  Hallucinations:Auditory states that she cannot describe the voices  Ideas of Reference:None  Suicidal Thoughts:No  Homicidal Thoughts:No   Sensorium  Memory:Immediate Good;Recent Good;Remote Good  Judgment:Good  Insight:Fair   Executive Functions  Concentration:Good  Attention Span:Good  Recall:Fair  Fund of Knowledge:Good  Language:Good   Psychomotor Activity  Psychomotor Activity:Normal   Assets  Assets:Communication Skills;Resilience;Social Support   Sleep  Sleep:Good  Number of hours: 8   Physical Exam: Physical Exam ROS Blood pressure 116/78, pulse 65, temperature 98.6 F (37 C), resp. rate 18, SpO2 98 %, not currently breastfeeding. There is no height or weight on file to calculate BMI.  Musculoskeletal: Strength & Muscle Tone: within normal limits Gait & Station: normal Patient leans: N/A   [facility] MSE Discharge Disposition for Follow up and Recommendations: Based on my evaluation the patient does not appear to have an emergency medical condition and can be discharged with resources and follow up care in outpatient services for Medication Management   Gillermo Murdoch, NP 01/02/2020, 11:46 PM

## 2020-01-02 NOTE — Discharge Instructions (Signed)
Continue on previous medication and contact outpatient provider to schedule an appointment for medication adjustment.

## 2020-01-02 NOTE — ED Notes (Signed)
Items in locker 25 

## 2020-01-03 ENCOUNTER — Telehealth (HOSPITAL_COMMUNITY): Payer: Self-pay

## 2020-01-03 ENCOUNTER — Telehealth (HOSPITAL_COMMUNITY): Payer: Self-pay | Admitting: *Deleted

## 2020-01-03 DIAGNOSIS — F23 Brief psychotic disorder: Secondary | ICD-10-CM | POA: Diagnosis not present

## 2020-01-03 NOTE — Telephone Encounter (Signed)
Patient's mom called and stated that patient is hearing voices again. Mom would like her Risperidone to be increased to 4mg . They use on [address] [place]. Follow up scheduled for 01/27/20. Please review and advise. Thank you.
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
## 2020-01-03 NOTE — ED Notes (Signed)
Reviewed AVS with pt and walked to lobby. Belongings returned. Pt A&O, steady on feet.

## 2020-01-03 NOTE — Telephone Encounter (Signed)
Patient called and wants her Risperdal increased because she is hearing voices again.  Please review.

## 2020-01-04 ENCOUNTER — Other Ambulatory Visit (HOSPITAL_COMMUNITY): Payer: Self-pay | Admitting: Psychiatry

## 2020-01-04 MED ORDER — RISPERIDONE 3 MG PO TABS: 3.0000 mg | ORAL_TABLET | Freq: Every day | ORAL | 0 refills | Status: DC

## 2020-01-04 NOTE — Telephone Encounter (Signed)
We just decreased it from 3 mg (on which she has not been hearing voices) to 2 mg. She should start taking two 2 mg tablets then until it runs out then go down to 3 mg which I will re-order.

## 2020-01-05 ENCOUNTER — Ambulatory Visit (HOSPITAL_COMMUNITY)
Admission: AD | Admit: 2020-01-05 | Discharge: 2020-01-05 | Disposition: A | Discharge status: Home / Self Care | Payer: BC Managed Care – PPO | Attending: Psychiatry | Admitting: Psychiatry

## 2020-01-05 DIAGNOSIS — F23 Brief psychotic disorder: Secondary | ICD-10-CM | POA: Insufficient documentation
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
## 2020-01-05 NOTE — H&P (Signed)
Behavioral Health Medical Screening Exam  Stephanie Cabrera is an 19 y.o. female with a history of who presents voluntarily to [facility] as a walk-in requesting medication changes due to AVH. Patient reports that auditory hallucinations of voices that she cannot describe and visual hallucinations of angels have returned since decreasing Risperdal. She states that she is not currently experiencing AVH. She does not appear to be responding to internal stimuli.  Patient contacted Dr. Stanton Kidney office on 01/03/2020 requesting an increase in Risperdal. Reviewed Dr. Stanton Kidney recommendation with the patient.  Note from Dr. Hinton Dyer 01/03/2020: "We just decreased it from 3 mg (on which she has not been hearing voices) to 2 mg. She should start taking two 2 mg tablets then until it runs out then go down to 3 mg which I will re-order."  Total Time spent with patient: 15 minutes  Psychiatric Specialty Exam: Physical Exam Constitutional:      General: She is not in acute distress.    Appearance: She is not ill-appearing or toxic-appearing.  Pulmonary:     Effort: Pulmonary effort is normal. No respiratory distress.  Neurological:     Mental Status: She is alert and oriented to person, place, and time.  Psychiatric:        Mood and Affect: Mood is anxious and depressed.        Behavior: Behavior is cooperative.        Thought Content: Thought content does not include homicidal or suicidal ideation.    Review of Systems  Constitutional: Negative for chills, diaphoresis, fatigue and fever.  HENT: Negative for sore throat.   Respiratory: Negative for cough and shortness of breath.   Cardiovascular: Negative for chest pain.  Gastrointestinal: Negative for diarrhea, nausea and vomiting.  Neurological: Negative for dizziness and seizures.  Psychiatric/Behavioral: Positive for dysphoric mood, hallucinations and sleep disturbance. Negative for suicidal ideas. The patient is nervous/anxious.     not currently breastfeeding.There is no height or weight on file to calculate BMI. General Appearance: Casual and Neat Eye Contact:  Good Speech:  Clear and Coherent and Normal Rate Volume:  Normal Mood:  Depressed Affect:  Congruent Thought Process:  Coherent, Linear and Descriptions of Associations: Intact Orientation:  Full (Time, Place, and Person) Thought Content:  Logical Suicidal Thoughts:  No Homicidal Thoughts:  No Memory:  Immediate;   Good Recent;   Good Judgement:  Fair Insight:  Fair Psychomotor Activity:  Normal Concentration: Concentration: Fair and Attention Span: Fair Recall:  [employer] of Knowledge:Good Language: Good Akathisia:  Negative Handed:  Right AIMS (if indicated):    Assets:  Communication Skills Desire for Improvement Financial Resources/Insurance Housing Physical Health Sleep:      Recommendations: Based on my evaluation the patient does not appear to have an emergency medical condition.   Disposition: No evidence of imminent risk to self or others at present.   Patient does not meet criteria for psychiatric inpatient admission. Supportive therapy provided about ongoing stressors. Discussed crisis plan, support from social network, calling 911, coming to the Emergency Department, and calling Suicide Hotline. Patient encouraged to reach out to Dr. Stanton Kidney office for questions/concerns regarding medication adjustments.   Jackelyn Poling, NP 01/05/2020, 3:14 AM

## 2020-01-05 NOTE — Telephone Encounter (Signed)
Opened by mistake.

## 2020-01-05 NOTE — Telephone Encounter (Signed)
Notified patients mom

## 2020-01-06 ENCOUNTER — Telehealth (HOSPITAL_COMMUNITY): Payer: BC Managed Care – PPO | Admitting: Psychiatry

## 2020-01-06 ENCOUNTER — Other Ambulatory Visit: Payer: Self-pay

## 2020-01-07 ENCOUNTER — Other Ambulatory Visit: Payer: Self-pay

## 2020-01-07 ENCOUNTER — Ambulatory Visit
Admission: RE | Admit: 2020-01-07 | Discharge: 2020-01-07 | Disposition: A | Discharge status: Home / Self Care | Payer: BC Managed Care – PPO | Source: Ambulatory Visit | Attending: Neurology | Admitting: Neurology

## 2020-01-07 ENCOUNTER — Telehealth: Payer: Self-pay

## 2020-01-07 ENCOUNTER — Telehealth: Payer: Self-pay | Admitting: Neurology

## 2020-01-07 DIAGNOSIS — G9389 Other specified disorders of brain: Secondary | ICD-10-CM | POA: Diagnosis not present

## 2020-01-07 DIAGNOSIS — R569 Unspecified convulsions: Secondary | ICD-10-CM | POA: Diagnosis not present

## 2020-01-07 DIAGNOSIS — R413 Other amnesia: Secondary | ICD-10-CM

## 2020-01-07 DIAGNOSIS — R440 Auditory hallucinations: Secondary | ICD-10-CM

## 2020-01-07 DIAGNOSIS — R200 Anesthesia of skin: Secondary | ICD-10-CM | POA: Diagnosis not present

## 2020-01-07 DIAGNOSIS — R410 Disorientation, unspecified: Secondary | ICD-10-CM | POA: Diagnosis not present

## 2020-01-07 IMAGING — MR MR HEAD WO/W CM
11 of 14 series · 36 of 48 positions shown · IV contrast (multihance)
Comparison: CT head [DATE]

CLINICAL DATA: Seizure. Confusion memory loss. Right hand numbness.

EXAM:
MRI HEAD WITHOUT AND WITH CONTRAST
TECHNIQUE: Multiplanar, multiecho pulse sequences of the brain and surrounding
structures were obtained without and with intravenous contrast.
CONTRAST:  15mL MULTIHANCE GADOBENATE DIMEGLUMINE 529 MG/ML IV SOLN

[Series 2: T1 · sagittal · 5.0mm · 0.45mm/px · 2 of 23 slices shown]
[im 1/23]
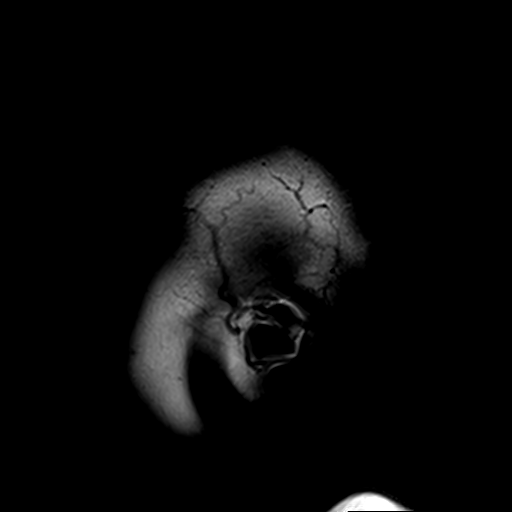
[im 23/23]
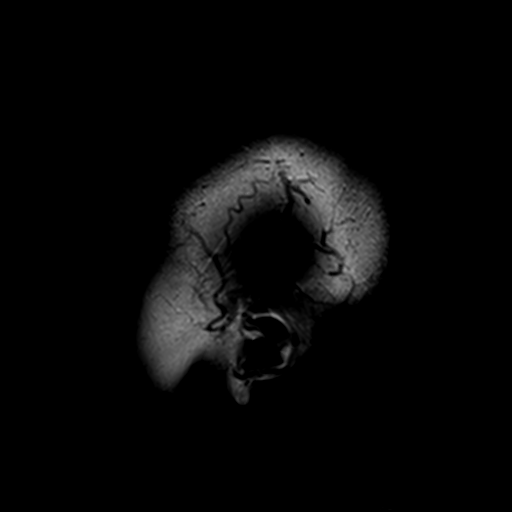

[Series 3: DWI · axial · 3.0mm · 1.80mm/px · z∈[-53,+94]mm · 7 of 100 slices shown (1 of 2)]
[im 1/100]
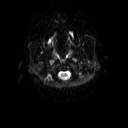
[im 17/100]
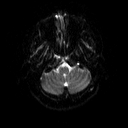
[im 34/100]
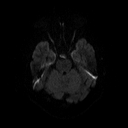
[im 50/100]
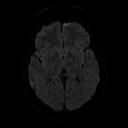
[im 67/100]
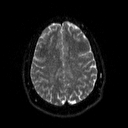
[im 83/100]
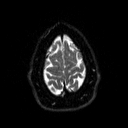
[im 100/100]
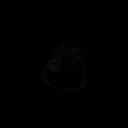

[Series 4: DWI · axial · 3.0mm · 1.80mm/px · z∈[-53,+94]mm · 4 of 50 slices shown (2 of 2)]
[im 1/50]
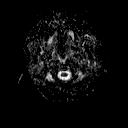
[im 17/50]
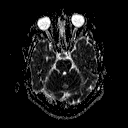
[im 33/50]
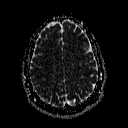
[im 50/50]
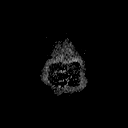

[Series 5: T2 · axial · 5.0mm · 0.51mm/px · z∈[-47,+95]mm · 2 of 22 slices shown (1 of 4)]
[im 1/22]
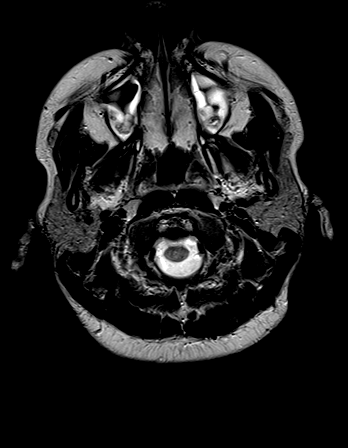
[im 22/22]
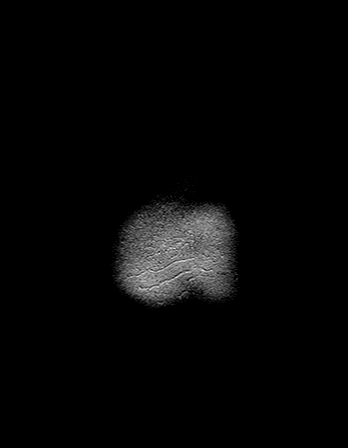

[Series 6: FLAIR · axial · 3.0mm · 0.45mm/px · z∈[-46,+89]mm · 2 of 30 slices shown (1 of 2)]
[im 1/30]
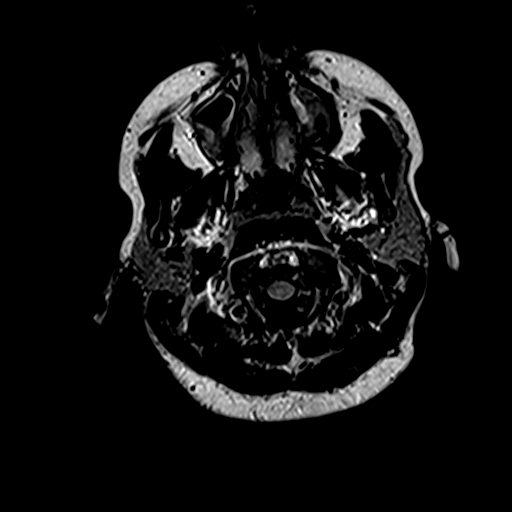
[im 30/30]
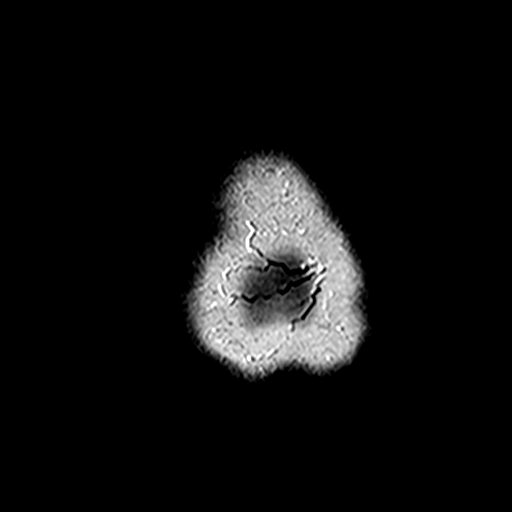

[Series 8: swi_images · axial · 2.0mm · 0.90mm/px · z∈[-61,+97]mm · 6 of 80 slices shown]
[im 1/80]
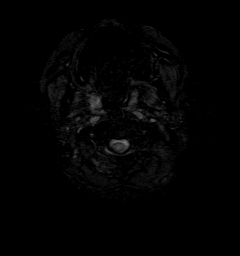
[im 16/80]
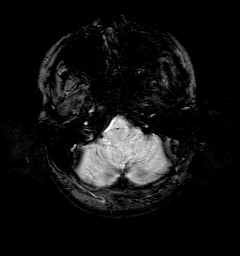
[im 32/80]
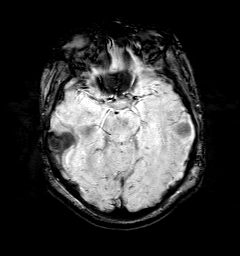
[im 48/80]
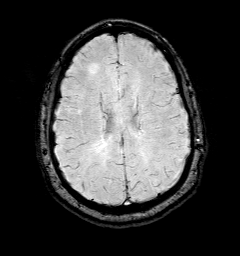
[im 64/80]
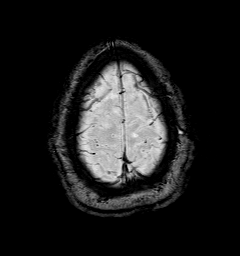
[im 80/80]
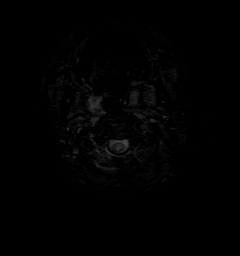

[Series 9: t1_mpr_tra · axial · 2.0mm · 0.45mm/px · z∈[-56,+102]mm · 6 of 80 slices shown]
[im 1/80]
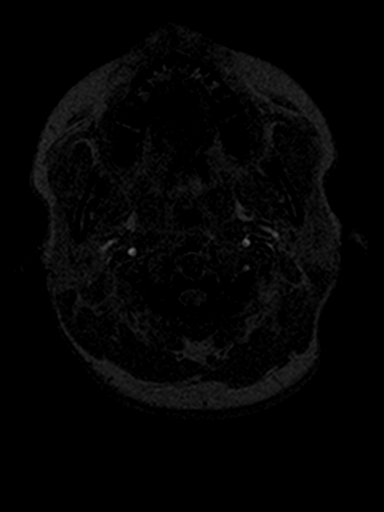
[im 16/80]
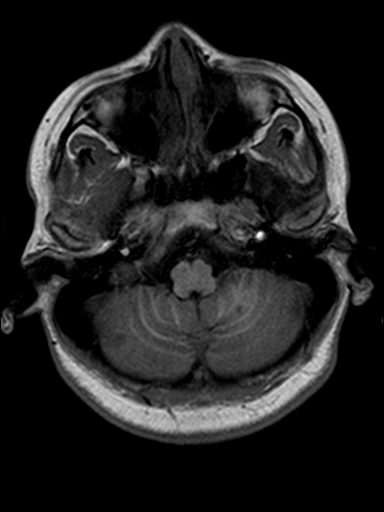
[im 32/80]
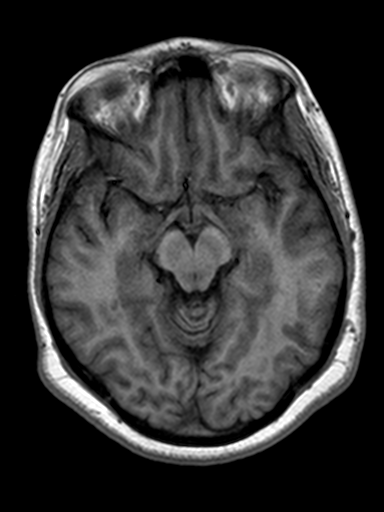
[im 48/80]
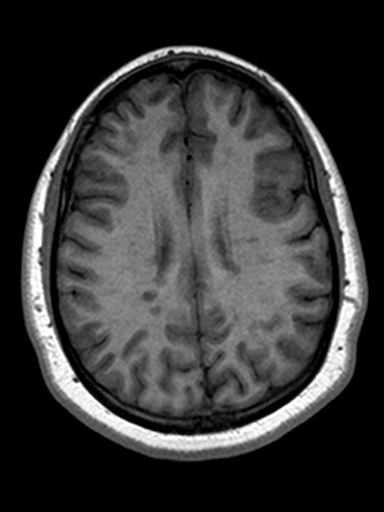
[im 64/80]
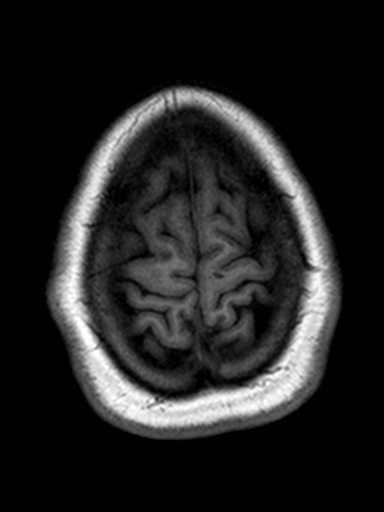
[im 80/80]
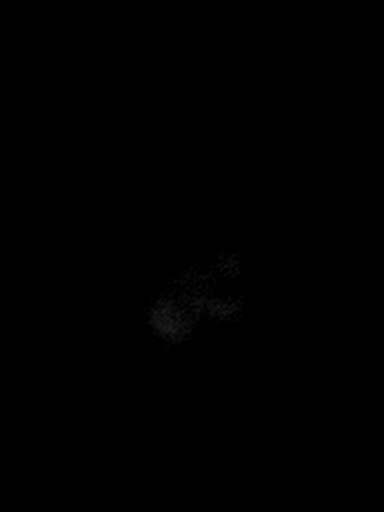

[Series 10: T2 · coronal · 3.0mm · 0.23mm/px · 2 of 28 slices shown (2 of 4)]
[im 1/28]
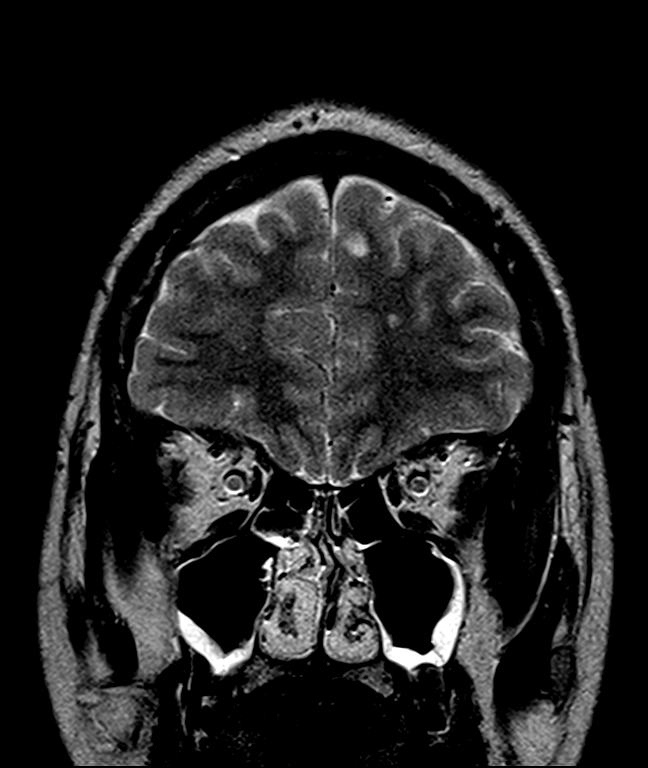
[im 28/28]
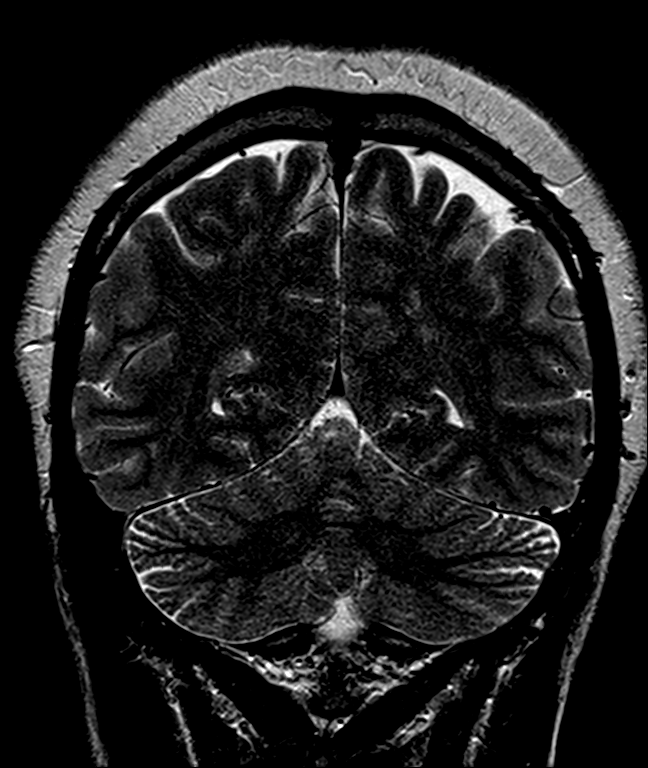

[Series 11: FLAIR · coronal · 3.0mm · 0.70mm/px · 2 of 28 slices shown (2 of 2)]
[im 1/28]
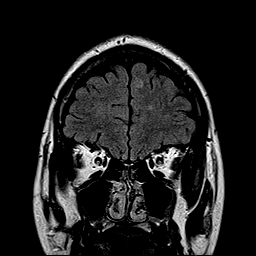
[im 28/28]
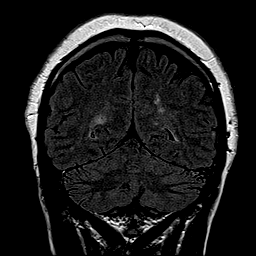

[Series 12: T2 · coronal · 5.0mm · 0.45mm/px · 1 of 14 slices shown (3 of 4)]
[im 1/14]
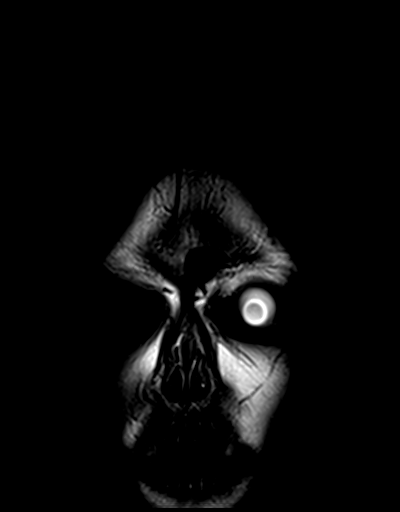

[Series 13: T2 · coronal · 5.0mm · 0.45mm/px · 2 of 27 slices shown (4 of 4)]
[im 1/27]
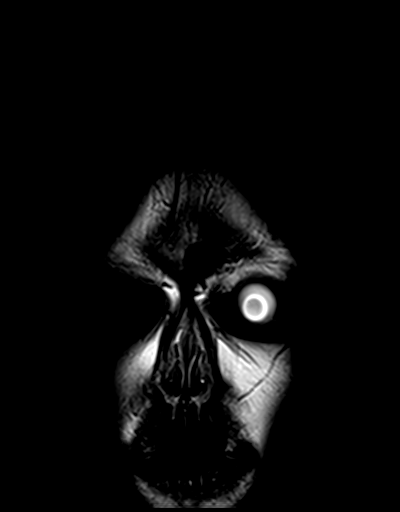
[im 27/27]
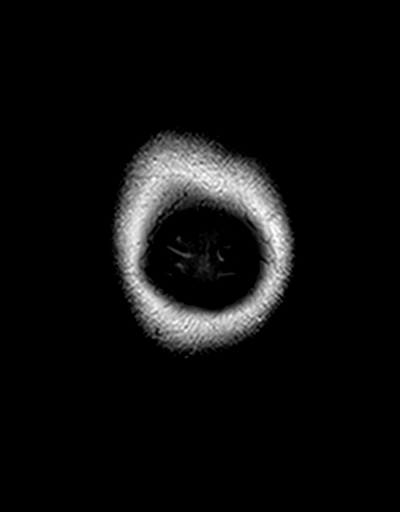

[36 of 48 positions shown; findings below may reference images not displayed]

FINDINGS: Brain: Numerous white matter lesions are present. Many these are
periventricular and some are in the subcortical and deep white
matter. There is a 10 x 15 mm lesion in the left brachium pontis
with additional smaller lesions in the brachium pontis bilaterally.

There is some restricted diffusion in lesion in the left mid frontal
lobe. Multiple enhancing lesions are present including in the
anterior frontal lobes bilaterally, right middle frontal lobe, in
the right parietal periventricular white matter. Lesions compatible
with multiple sclerosis with areas of acute demyelinization.

Ventricle size normal.  No intracranial hemorrhage.

Vascular: Normal arterial flow voids

Skull and upper cervical spine: Negative

Sinuses/Orbits: Moderate mucosal edema paranasal sinuses. Negative
orbit

Other: None
IMPRESSION: Extensive white matter changes compatible with multiple sclerosis.
Multiple enhancing lesions. Restricted diffusion left frontal white
matter lesion. These lesions are consistent with acute
demyelinization.

These results will be called to the ordering clinician or
representative by the Radiologist Assistant, and communication
documented in the PACS or [REDACTED].

## 2020-01-07 MED ORDER — GADOBENATE DIMEGLUMINE 529 MG/ML IV SOLN
15.0000 mL | Freq: Once | INTRAVENOUS | Status: AC | PRN
  Administered 2020-01-07: 15 mL via INTRAVENOUS

## 2020-01-07 NOTE — Telephone Encounter (Signed)
Reviewed MRI brain results showing white mater lesions, some enhancing, consistent with MS with areas of acute demyelination. Spoke to mother Stephanie Cabrera about results. She reports Stephanie Cabrera is currently feeling fine with no symptoms.Discussed that typically with with enhancing lesions we would start IV Solumedrol for 3-5 days, we can do this as an outpatient and order it on Monday, however if symptoms worsen this weekend, instructed to go to ER for inpatient IV Solumedrol. Will need to monitor as well for worsening psychosis with IV steroids. She will be scheduled for a f/u on Monday to discuss MS dx, treatment, and outpatient infusion.

## 2020-01-07 NOTE — Telephone Encounter (Signed)
[facility] called with MRI results, results printed given to dr Karel Jarvis

## 2020-01-10 ENCOUNTER — Encounter: Payer: Self-pay | Admitting: Neurology

## 2020-01-10 ENCOUNTER — Other Ambulatory Visit: Payer: Self-pay

## 2020-01-10 ENCOUNTER — Ambulatory Visit (INDEPENDENT_AMBULATORY_CARE_PROVIDER_SITE_OTHER): Payer: BC Managed Care – PPO | Admitting: Neurology

## 2020-01-10 VITALS — BP 128/82 | HR 81 | Resp 18 | Ht 67.0 in | Wt 177.0 lb

## 2020-01-10 DIAGNOSIS — R569 Unspecified convulsions: Secondary | ICD-10-CM

## 2020-01-10 DIAGNOSIS — G35 Multiple sclerosis: Secondary | ICD-10-CM

## 2020-01-10 NOTE — Patient Instructions (Addendum)
1. Schedule MRI cervical and thoracic spine with and without contrast  2. Schedule 3-day IV infusion of Solumedrol (steroid). Monitor for any worsening of psychosis with steroid treatment  3. Follow-up after tests, call for any changes  We have sent a referral to [facility] for your MRI and they will call you directly to schedule your appointment. They are located at [address] [facility]. If you need to contact them directly please call [phone].

## 2020-01-10 NOTE — Progress Notes (Signed)
NEUROLOGY FOLLOW UP OFFICE NOTE  Stephanie Cabrera [id number] Aug 27, 2000  HISTORY OF PRESENT ILLNESS: I had the pleasure of seeing Stephanie Cabrera in follow-up in the neurology clinic on 01/10/2020. She is again accompanied by her mother who helps supplement the history today. Her 21 year old daughter Stephanie Cabrera is also present. The patient was last seen 3 months ago after a seizure-like event in May 2021. Since then, she has had confusional episodes diagnosed as psychosis and auditory hallucinations. She had a routine and 24-hour EEG which were abnormal due to focal slowing over the bilateral temporal regions, right greater than left. There was also note of frontal intermittent rhythmic delta activity (FIRDA). No epileptiform discharges seen, typical events not captured. She had an MRI brain with and without contrast which I personally reviewed, they present today to discuss results. MRI brain showed numerous white matter lesions, many are periventricular, some are subcortical and deep white matter. There is a 10x28mm lesion in the left brachium pontis with additional smaller lesions in the brachium pontis bilaterally. There was some restricted diffusion in the left mid-frontal lobe, multiple enhancing lesions in the anterior frontal lobes bilaterally, right middle frontal lobe, right parietal periventricular white matter. Findings felt compatible with multiple sclerosis.  Since her last visit, she reports intermittent right hand numbness. She has occasional headaches and feels dizzy sometimes when standing too quickly. No vision loss, vision gets blurred sometimes. She gets confused sometimes when asked a question. Her mother has not noticed any unresponsive episodes, however she sometimes feels she is staring off. No further loss of consciousness since May 2021. She was feeling drowsy on 4mg  of Risperdal, but when dose was reduced to 3mg , she started having auditory hallucinations again. Dose increased  back to 4mg  (taking 2mg  2 tabs daily), last time she heard voices was this week. She denies any bowel/bladder dysfunction.    History on Initial Assessment 11/02/2019: This is a pleasant 19 year old right-handed woman with a history of anxiety presenting for evaluation of seizure-like activity that occurred on 09/02/2019. She was in her usual state of health until 09/02/19 when she recalls sitting on the commode then waking up in the ambulance. Her mother reports that she was tired and took 4 tablets of melatonin 3mg  which her mother thought was too much. She increased her water intake and was in the bathroom when she called her mother saying her right hand was going numb, then she started losing consciousness and fell off the toilet. Her mother laid her on the ground and saw her whole body stiffening and shaking for 3 minutes. She bit her tongue. When EMS arrived, she could say her name and follow instructions, no focal weakness. She left the ER AMA. She saw her PCP on 09/15/19 and reported increassed stress worse after her baby was born in 08/2018. A week later, she was brought to the ER on 6/17 for IVC because she started destroying her TV and lamp in her bedroom. Family walked into the room and she was naked on her dresser with items around her. Family calmed her down and she indicated no memory of the event. According to IVC paperwork, this has happened 2 times previously, she had told them she has been speaking to God. She had not been sleeping well. Head CT no acute changes. 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 Per notes, she had been having auditory hallucinations for several  weeks to months, however her mother states she started hallucinating after the seizure in May. She went back to [facility] on 7/8 due to recurrence of auditory hallucinations. Dose increased to 4mg  qhs yesterday. She denies any further hallucinations. She states her  mood is good, she denies any depression or anxiety. Her mother reports that when the seizure occurred, her anxiety was bad. She had been working for 3 months as a but quit working due to stress at the beginning of May. She is now sleeping better.   She has had constant numbness in her hands since she gave birth a year ago, R>L. She denies any headaches, dizziness, diplopia, neck/back pain, bowel/bladder dysfunction. She has occasional episodes of a metallic taste in her mouth for a few minutes that occur a couple of times a month. She has occasional jerks in her back. Her mother denies any staring/unresponsive episodes. She lives with her mother and 43 year old daughter 2. Her mother feels she is back to baseline and is mostly worried about her memory. Since the seizure, she cannot remember where she puts things. Her mother administers her medication. She has not been driving. She denies any alcohol or illicit drug use.   Epilepsy Risk Factors:  Her paternal grandfather had seizures. Otherwise she had a normal birth and early development.  There is no history of febrile convulsions, CNS infections such as meningitis/encephalitis, significant traumatic brain injury, neurosurgical procedures.  PAST MEDICAL HISTORY: Past Medical History:  Diagnosis Date  . Allergy   . Medical history non-contributory   . Seizures (HCC)     MEDICATIONS: Current Outpatient Medications on File Prior to Visit  Medication Sig Dispense Refill  . risperiDONE (RISPERDAL) 3 MG tablet Take 1 tablet (3 mg total) by mouth at bedtime. 30 tablet 0   No current facility-administered medications on file prior to visit.    ALLERGIES: No Known Allergies  FAMILY HISTORY: Family History  Problem Relation Age of Onset  . Diabetes Paternal Grandmother   . Diabetes Paternal Grandfather   . Hypertension Paternal Grandfather     SOCIAL HISTORY: Social History   Socioeconomic History  . Marital status: Single     Spouse name: Not on file  . Number of children: 1  . Years of education: Not on file  . Highest education level: Not on file  Occupational History  . Not on file  Tobacco Use  . Smoking status: Never Smoker  . Smokeless tobacco: Never Used  Vaping Use  . Vaping Use: Never used  Substance and Sexual Activity  . Alcohol use: Never    Alcohol/week: 0.0 standard drinks  . Drug use: Never  . Sexual activity: Not Currently    Birth control/protection: None  Other Topics Concern  . Not on file  Social History Narrative   Right Handed   One Story    Lives with mom and daughter   No Caffeine   Social Determinants of Health   Financial Resource Strain:   . Difficulty of Paying Living Expenses: Not on file  Food Insecurity:   . Worried About Stephanie Cabrera in the Last Year: Not on file  . Ran Out of Food in the Last Year: Not on file  Transportation Needs:   . Lack of Transportation (Medical): Not on file  . Lack of Transportation (Non-Medical): Not on file  Physical Activity:   . Days of Exercise per Week: Not on file  . Minutes of Exercise per Session: Not  on file  Stress:   . Feeling of Stress : Not on file  Social Connections:   . Frequency of Communication with Friends and Family: Not on file  . Frequency of Social Gatherings with Friends and Family: Not on file  . Attends Religious Services: Not on file  . Active Member of Clubs or Organizations: Not on file  . Attends Banker Meetings: Not on file  . Marital Status: Not on file  Intimate Partner Violence:   . Fear of Current or Ex-Partner: Not on file  . Emotionally Abused: Not on file  . Physically Abused: Not on file  . Sexually Abused: Not on file     PHYSICAL EXAM: Vitals:   01/10/20 1142  BP: 128/82  Pulse: 81  Resp: 18  SpO2: 97%   General: No acute distress Head/Neck:  Normocephalic/atraumatic. Negative Lhermitte sign. Skin/Extremities: No rash, no edema Neurological Exam: alert  and oriented to person, place, and time. No aphasia or dysarthria. Fund of knowledge is appropriate.  Recent and remote memory are intact.  Attention and concentration are normal.   Cranial nerves: Pupils equal, round. Extraocular movements intact with no nystagmus. Visual fields full.  No facial asymmetry.  Motor: Bulk and tone normal, muscle strength 5/5 throughout with no pronator drift.   Finger to nose testing intact.  Gait narrow-based and steady, able to tandem walk adequately.  Romberg negative.   IMPRESSION: This is a pleasant 19 yo RH woman with a history of anxiety, new onset auditory hallucinations diagnosed with a brief psychotic episode last 09/23/2019 when she was found confused. Curiously, her mother witnessed an episode concerning for seizure 3 weeks prior. Her neurological exam is normal. Her EEG had shown bilateral temporal focal slowing, right greater than left, no epileptiform discharges. No further seizures since May 2021. She presents today to discuss MRI findings concerning for multiple sclerosis. Her presentation is unusual for MS. She was understandably upset with our discussion today, we discussed doing an MRI cervical and thoracic spine, and if normal, would do a lumbar puncture. Several of the lesions on MRI were enhancing, discussed doing 3 days of IV Solumedrol 1000mg , monitor for worsening psychosis. Her psychiatrist is on board with steroid administration. We will plan for discussion of potentially starting disease modifying treatment after her spinal MRIs/lumbar puncture. She is aware of [place] laws to stop driving after a seizure until 6 months seizure-free. Follow-up after tests, they know to call for any changes.    Thank you for allowing me to participate in her care.  Please do not hesitate to call for any questions or concerns.   , M.D.   CC: Dr. Patrcia Dolly

## 2020-01-11 ENCOUNTER — Telehealth: Payer: Self-pay | Admitting: Neurology

## 2020-01-11 NOTE — Telephone Encounter (Signed)
Mary notified of orders at [facility], notes sent.

## 2020-01-11 NOTE — Telephone Encounter (Signed)
AccessNurse 01/11/20 @ 12:15pm:  "Caller from [facility] requesting return call from [employer]. Caller needs additional info to confirm. Patient started tomorrow morning."

## 2020-01-12 ENCOUNTER — Telehealth: Payer: Self-pay | Admitting: Neurology

## 2020-01-12 ENCOUNTER — Encounter: Payer: Self-pay | Admitting: Neurology

## 2020-01-12 ENCOUNTER — Other Ambulatory Visit: Payer: Self-pay | Admitting: Neurology

## 2020-01-12 ENCOUNTER — Emergency Department (HOSPITAL_COMMUNITY)
Admission: EM | Admit: 2020-01-12 | Discharge: 2020-01-12 | Disposition: A | Discharge status: Home / Self Care | Payer: BC Managed Care – PPO | Attending: Emergency Medicine | Admitting: Emergency Medicine

## 2020-01-12 ENCOUNTER — Other Ambulatory Visit: Payer: Self-pay

## 2020-01-12 DIAGNOSIS — G40009 Localization-related (focal) (partial) idiopathic epilepsy and epileptic syndromes with seizures of localized onset, not intractable, without status epilepticus: Secondary | ICD-10-CM

## 2020-01-12 DIAGNOSIS — G40909 Epilepsy, unspecified, not intractable, without status epilepticus: Secondary | ICD-10-CM | POA: Diagnosis not present

## 2020-01-12 DIAGNOSIS — R410 Disorientation, unspecified: Secondary | ICD-10-CM | POA: Diagnosis not present

## 2020-01-12 DIAGNOSIS — R569 Unspecified convulsions: Secondary | ICD-10-CM | POA: Diagnosis not present

## 2020-01-12 DIAGNOSIS — R0902 Hypoxemia: Secondary | ICD-10-CM | POA: Diagnosis not present

## 2020-01-12 DIAGNOSIS — R404 Transient alteration of awareness: Secondary | ICD-10-CM | POA: Diagnosis not present

## 2020-01-12 LAB — CBC
HCT: 44 % (ref 36.0–46.0)
Hemoglobin: 14.6 g/dL (ref 12.0–15.0)
MCH: 28.1 pg (ref 26.0–34.0)
MCHC: 33.2 g/dL (ref 30.0–36.0)
MCV: 84.8 fL (ref 80.0–100.0)
Platelets: 272 10*3/uL (ref 150–400)
RBC: 5.19 MIL/uL — ABNORMAL HIGH (ref 3.87–5.11)
RDW: 11.9 % (ref 11.5–15.5)
WBC: 6.9 10*3/uL (ref 4.0–10.5)
nRBC: 0 % (ref 0.0–0.2)

## 2020-01-12 LAB — BASIC METABOLIC PANEL
Anion gap: 9 (ref 5–15)
BUN: 10 mg/dL (ref 6–20)
CO2: 25 mmol/L (ref 22–32)
Calcium: 9.4 mg/dL (ref 8.9–10.3)
Chloride: 104 mmol/L (ref 98–111)
Creatinine, Ser: 0.73 mg/dL (ref 0.44–1.00)
GFR calc non Af Amer: >60 mL/min (ref >60)
Glucose, Bld: 99 mg/dL (ref 70–99)
Potassium: 4 mmol/L (ref 3.5–5.1)
Sodium: 138 mmol/L (ref 135–145)

## 2020-01-12 LAB — I-STAT BETA HCG BLOOD, ED (MC, WL, AP ONLY): I-stat hCG, quantitative: <5 m[IU]/mL (ref <5)

## 2020-01-12 MED ORDER — OXCARBAZEPINE 300 MG PO TABS: ORAL_TABLET | ORAL | 5 refills | Status: DC

## 2020-01-12 MED ORDER — LEVETIRACETAM IN NACL 1500 MG/100ML IV SOLN
1500.0000 mg | Freq: Once | INTRAVENOUS | Status: AC
  Administered 2020-01-12: 1500 mg via INTRAVENOUS
  Filled 2020-01-12: qty 100

## 2020-01-12 MED ORDER — LEVETIRACETAM 500 MG PO TABS: 500.0000 mg | ORAL_TABLET | Freq: Two times a day (BID) | ORAL | 0 refills | Status: DC

## 2020-01-12 NOTE — Telephone Encounter (Signed)
Patient's mother Stephanie Cabrera called in and left a message. She stated she called in earlier today, but has not heard back from anyone.

## 2020-01-12 NOTE — ED Triage Notes (Signed)
Pt arrived via ems from home due to seizures. Ems states mother heard commotion coming from pts room found pt in a full body seizure that lasted less than 1 min. Pt has similar episode x58months ago. Pt A&O x4.  VS stable  122/76 HR 90 sats 98% cbg 98

## 2020-01-12 NOTE — Telephone Encounter (Signed)
Sent message, waiting message back.

## 2020-01-12 NOTE — Discharge Instructions (Addendum)
You need to follow-up with your neurologist for further testing.  You should not drive a car, operate machinery or do anything dangerous.  Start taking the antiseizure medication right away.  Return to the ER for worsening symptoms.

## 2020-01-12 NOTE — ED Provider Notes (Signed)
[facility] EMERGENCY DEPARTMENT Provider Note   CSN: [id number] Arrival date & time: 01/12/20  0401     History Chief Complaint  Patient presents with  . Seizures    Stephanie Cabrera is a 18 y.o. female.  Patient presents to the emergency department after a seizure.  Patient was asleep when the seizure occurred.  Mother heard a noise, went to the patient's room and checked on her and found her having a generalized tonic-clonic seizure.  By time of arrival to the ER patient is awake and alert.  She does not remember the event.  She did not bite her tongue.  She is uncertain if she was incontinent of urine.        Past Medical History:  Diagnosis Date  . Allergy   . Medical history non-contributory   . Seizures [facility])     Patient Active Problem List   Diagnosis Date Noted  . Brief psychotic disorder (HCC) 09/24/2019    Past Surgical History:  Procedure Laterality Date  . NO PAST SURGERIES       OB History    Gravida  1   Para  1   Term  1   Preterm      AB      Living  1     SAB      TAB      Ectopic      Multiple  0   Live Births  1           Family History  Problem Relation Age of Onset  . Diabetes Paternal Grandmother   . Diabetes Paternal Grandfather   . Hypertension Paternal Grandfather     Social History   Tobacco Use  . Smoking status: Never Smoker  . Smokeless tobacco: Never Used  Vaping Use  . Vaping Use: Never used  Substance Use Topics  . Alcohol use: Never    Alcohol/week: 0.0 standard drinks  . Drug use: Never    Home Medications Prior to Admission medications   Medication Sig Start Date End Date Taking? Authorizing Provider  risperiDONE (RISPERDAL) 3 MG tablet Take 1 tablet (3 mg total) by mouth at bedtime. 01/04/20 02/03/20  Pucilowski, Roosvelt Maser, MD    Allergies    Patient has no known allergies.  Review of Systems   Review of Systems  Neurological: Positive for seizures.  All  other systems reviewed and are negative.   Physical Exam Updated Vital Signs BP 134/87 (BP Location: Right Arm)   Pulse 84   Temp 97.6 F (36.4 C) (Oral)   Resp 18   Ht 5\' 7"  (1.702 m)   Wt 77.1 kg   SpO2 99%   BMI 26.63 kg/m   Physical Exam Vitals and nursing note reviewed.  Constitutional:      General: She is not in acute distress.    Appearance: Normal appearance. She is well-developed.  HENT:     Head: Normocephalic and atraumatic.     Right Ear: Hearing normal.     Left Ear: Hearing normal.     Nose: Nose normal.  Eyes:     Conjunctiva/sclera: Conjunctivae normal.     Pupils: Pupils are equal, round, and reactive to light.  Cardiovascular:     Rate and Rhythm: Regular rhythm.     Heart sounds: S1 normal and S2 normal. No murmur heard.  No friction rub. No gallop.   Pulmonary:     Effort: Pulmonary effort is normal.  No respiratory distress.     Breath sounds: Normal breath sounds.  Chest:     Chest wall: No tenderness.  Abdominal:     General: Bowel sounds are normal.     Palpations: Abdomen is soft.     Tenderness: There is no abdominal tenderness. There is no guarding or rebound. Negative signs include Murphy's sign and McBurney's sign.     Hernia: No hernia is present.  Musculoskeletal:        General: Normal range of motion.     Cervical back: Normal range of motion and neck supple.  Skin:    General: Skin is warm and dry.     Findings: No rash.  Neurological:     Mental Status: She is alert and oriented to person, place, and time.     GCS: GCS eye subscore is 4. GCS verbal subscore is 5. GCS motor subscore is 6.     Cranial Nerves: No cranial nerve deficit.     Sensory: No sensory deficit.     Coordination: Coordination normal.  Psychiatric:        Speech: Speech normal.        Behavior: Behavior normal.        Thought Content: Thought content normal.     ED Results / Procedures / Treatments   Labs (all labs ordered are listed, but only  abnormal results are displayed) Labs Reviewed  CBC  BASIC METABOLIC PANEL  I-STAT BETA HCG BLOOD, ED ([facility], [facility], [facility] ONLY)    EKG None  Radiology No results found.  Procedures Procedures (including critical care time)  Medications Ordered in ED Medications  levETIRAcetam (KEPPRA) IVPB 1500 mg/ 100 mL premix (has no administration in time range)    ED Course  I have reviewed the triage vital signs and the nursing notes.  Pertinent labs & imaging results that were available during my care of the patient were reviewed by me and considered in my medical decision making (see chart for details).    MDM Rules/Calculators/A&P                          Patient presents to the emergency department after a seizure.  This would be her second seizure this year.  Reviewing her records, she is followed by Dr. Karel Jarvis.  After her first seizure she had a 24-hour video EEG that did not show seizure waveforms but she did not have any episodes during the procedure.  She has not had a second seizure and will therefore initiate Keppra.  Final Clinical Impression(s) / ED Diagnoses Final diagnoses:  Seizure [facility])    Rx / DC Orders ED Discharge Orders    None       Arland Usery, Canary Brim, MD 01/12/20 [phone]

## 2020-01-12 NOTE — Telephone Encounter (Signed)
Spoke to mother, mother heard her yell out at 3am, she was having a convulsion, bit her tongue. Seizure lasted less than minute, confused after, no focal weakness. Complaining of her head hurting, given Tylenol. She has been sleeping. The prescription for Keppra was not at the pharmacy. Discussed that I would like to start a different seizure medication to also help with mood stabilization. Start oxcarbazepine 300mg : Take 1/2 tab BID x 3 days, then increase to 300mg  BID. Side effects discussed. She is scheduled for IV Solumedrol on Friday, mom trying to r/s to start tomorrow. Discussed that they should wait 2 weeks after steroid to schedule her 2nd COVID vaccine. Mother expressed understanding, knows to call for any issues.

## 2020-01-13 DIAGNOSIS — G35 Multiple sclerosis: Secondary | ICD-10-CM | POA: Diagnosis not present

## 2020-01-14 DIAGNOSIS — G35 Multiple sclerosis: Secondary | ICD-10-CM | POA: Diagnosis not present

## 2020-01-17 ENCOUNTER — Telehealth: Payer: Self-pay

## 2020-01-17 NOTE — Telephone Encounter (Signed)
Spoke to pt mother Alphonsine is done with the infusion, they stated that it was from the tape. Iv is out now no complaints of itching, no rash noted,

## 2020-01-20 ENCOUNTER — Other Ambulatory Visit (HOSPITAL_COMMUNITY): Payer: Self-pay | Admitting: *Deleted

## 2020-01-20 MED ORDER — RISPERIDONE 3 MG PO TABS: 3.0000 mg | ORAL_TABLET | Freq: Every day | ORAL | 0 refills | Status: DC

## 2020-01-22 DIAGNOSIS — R7611 Nonspecific reaction to tuberculin skin test without active tuberculosis: Secondary | ICD-10-CM | POA: Diagnosis not present

## 2020-01-27 ENCOUNTER — Other Ambulatory Visit: Payer: Self-pay

## 2020-01-27 ENCOUNTER — Telehealth (INDEPENDENT_AMBULATORY_CARE_PROVIDER_SITE_OTHER): Payer: BC Managed Care – PPO | Admitting: Psychiatry

## 2020-01-27 DIAGNOSIS — F060 Psychotic disorder with hallucinations due to known physiological condition: Secondary | ICD-10-CM | POA: Diagnosis not present

## 2020-01-27 MED ORDER — RISPERIDONE 3 MG PO TABS: 3.0000 mg | ORAL_TABLET | Freq: Every day | ORAL | 0 refills | Status: DC

## 2020-01-27 NOTE — Progress Notes (Signed)
BH MD/PA/NP OP Progress Note  01/27/2020 10:43 AM Stephanie Cabrera  MRN:  [id number] Interview was conducted using videoconferencing application and I verified that I was speaking with the correct person using two identifiers. I discussed the limitations of evaluation and management by telemedicine and  the availability of in person appointments. Patient expressed understanding and agreed to proceed. Patient location - home; physician - home office.  Chief Complaint: "I am doing great".  HPI: 19 yo AAF who was recently (7/19-22/21)hospitalized at [facility] for a psychotic break. She started to experience hallucinations about 2 months ago, felt confused, having memory problems and per IVC papers started to destroy furniture/TV in her room. She denies ever before having similar experiences (confirmed by her mother). AH appeared suddenly; she denies any new medications, any sudden stress, she does not abuse alcohol or drugs. The only recent potentially stressful event is giving birth to her fists child Stephanie Cabrera (on 09/06/19). There is no psychiatric or substance abuse hx in her biological family. While in [facility] she was started on risperidone 1 mg in AM and 2 at bedtime. AH were described as a single female, unknown voice which at times woul tell her to do things like get on bed etc. It does not command her to harm self, child or others. It comes and goes but is still present on daily basis. She felt somewhat sedated after taking morning dose of risperidone. She denies having tremor, muscle twitching, difficulty swallowing. There is perhaps some increase in appetite observed. Her mood is neutral. She does not verbalize any paranoia or other delusional thought content. There is no hx of mania, depression but she admits to some anxiety predating current episode. She has a remote (middle school) hx of cutting. This was her first psychiatric admission. Chart review indicates that on May 27 she had a seizure like episode,  another one on 10/6. She stopped working Facilities manager job). Head CT done on 09/23/19 was normalbut EEG done by Dr. Karel Jarvis was not. MRI results are suggestive of MS. We tried to gradually lower dose of risperidone but when it was decrease to 2 mg she started to experience AH again. Around the same time she had her second seizure episode. Risperidone now is at 3 mg and she denies having AH. She has also been started on oxcarbazepine for seizures.    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  Past Medical History:  Past Medical History:  Diagnosis Date  . Allergy   . Medical history non-contributory   . Seizures (HCC)     Past Surgical History:  Procedure Laterality Date  . NO PAST SURGERIES      Family Psychiatric History: None.  Family History:  Family History  Problem Relation Age of Onset  . Diabetes Paternal Grandmother   . Diabetes Paternal Grandfather   . Hypertension Paternal Grandfather     Social History:  Social History   Socioeconomic History  . Marital status: Single    Spouse name: Not on file  . Number of children: 1  . Years of education: Not on file  . Highest education level: Not on file  Occupational History  . Not on file  Tobacco Use  . Smoking status: Never Smoker  . Smokeless tobacco: Never Used  Vaping Use  . Vaping Use: Never used  Substance and Sexual Activity  .  Alcohol use: Never    Alcohol/week: 0.0 standard drinks  . Drug use: Never  . Sexual activity: Not Currently    Birth control/protection: None  Other Topics Concern  . Not on file  Social History Narrative   Right Handed   One Story    Lives with mom and daughter   No Caffeine   Social Determinants of Health   Financial Resource Strain:   . Difficulty of Paying Living Expenses: Not on file  Food Insecurity:   . Worried About Programme researcher, broadcasting/film/video in the Last  Year: Not on file  . Ran Out of Food in the Last Year: Not on file  Transportation Needs:   . Lack of Transportation (Medical): Not on file  . Lack of Transportation (Non-Medical): Not on file  Physical Activity:   . Days of Exercise per Week: Not on file  . Minutes of Exercise per Session: Not on file  Stress:   . Feeling of Stress : Not on file  Social Connections:   . Frequency of Communication with Friends and Family: Not on file  . Frequency of Social Gatherings with Friends and Family: Not on file  . Attends Religious Services: Not on file  . Active Member of Clubs or Organizations: Not on file  . Attends Banker Meetings: Not on file  . Marital Status: Not on file    Allergies: No Known Allergies  Metabolic Disorder Labs: Lab Results  Component Value Date   HGBA1C 5.3 09/25/2019   MPG 105.41 09/25/2019   No results found for: PROLACTIN Lab Results  Component Value Date   CHOL 183 09/25/2019   TRIG 60 09/25/2019   HDL 67 09/25/2019   CHOLHDL 2.7 09/25/2019   VLDL 12 09/25/2019   LDLCALC 104 (H) 09/25/2019   Lab Results  Component Value Date   TSH 1.195 09/25/2019   TSH 1.479 09/23/2019    Therapeutic Level Labs: No results found for: LITHIUM No results found for: VALPROATE No components found for:  CBMZ  Current Medications: Current Outpatient Medications  Medication Sig Dispense Refill  . Oxcarbazepine (TRILEPTAL) 300 MG tablet Take 1/2 tablet twice a day for 3 days, then increase to 1 tablet twice a day 60 tablet 5  . [START ON 02/19/2020] risperiDONE (RISPERDAL) 3 MG tablet Take 1 tablet (3 mg total) by mouth at bedtime. 30 tablet 0   No current facility-administered medications for this visit.    Psychiatric Specialty Exam: Review of Systems  Neurological: Positive for numbness.  All other systems reviewed and are negative.   not currently breastfeeding.There is no height or weight on file to calculate BMI.  General Appearance:  Casual and Well Groomed  Eye Contact:  Good  Speech:  Clear and Coherent and Normal Rate  Volume:  Normal  Mood:  Euthymic  Affect:  Full Range  Thought Process:  Goal Directed and Linear  Orientation:  Full (Time, Place, and Person)  Thought Content: Logical   Suicidal Thoughts:  No  Homicidal Thoughts:  No  Memory:  Immediate;   Good Recent;   Good Remote;   Good  Judgement:  Good  Insight:  Good  Psychomotor Activity:  Normal  Concentration:  Concentration: Good  Recall:  Good  Fund of Knowledge: Good  Language: Good  Akathisia:  Negative  Handed:  Right  AIMS (if indicated): not done  Assets:  Communication Skills Desire for Improvement Financial Resources/Insurance Housing Social Support  ADL's:  Intact  Cognition:  WNL  Sleep:  Good   Screenings: AIMS     [facility]  AIMS Total Score 0    AUDIT     [facility]  Alcohol Use Disorder Identification Test Final Score (AUDIT) 0    GAD-7     Office Visit from 09/15/2019 in [facility] [place]  Total GAD-7 Score 6    PHQ2-9     ED from 11/07/2019 in [facility] Office Visit from 09/15/2019 in [facility] [place]  PHQ-2 Total Score 0 2  PHQ-9 Total Score 0 8       Assessment and Plan: 19 yo AAF who was recently (7/19-22/21)hospitalized at [facility] for a psychotic break. She started to experience hallucinations about 2 months ago, felt confused, having memory problems and per IVC papers started to destroy furniture/TV in her room. She denies ever before having similar experiences (confirmed by her mother). AH appeared suddenly; she denies any new medications, any sudden stress, she does not abuse alcohol or drugs. The only recent potentially stressful event is giving birth to her fists child Stephanie Cabrera (on 09/06/19). There is no  psychiatric or substance abuse hx in her biological family. While in [facility] she was started on risperidone 1 mg in AM and 2 at bedtime. AH were described as a single female, unknown voice which at times woul tell her to do things like get on bed etc. It does not command her to harm self, child or others. It comes and goes but is still present on daily basis. She felt somewhat sedated after taking morning dose of risperidone. She denies having tremor, muscle twitching, difficulty swallowing. There is perhaps some increase in appetite observed. Her mood is neutral. She does not verbalize any paranoia or other delusional thought content. There is no hx of mania, depression but she admits to some anxiety predating current episode. She has a remote (middle school) hx of cutting. This was her first psychiatric admission. Chart review indicates that on May 27 she had a seizure like episode, another one on 10/6. She stopped working Facilities manager job). Head CT done on 09/23/19 was normalbut EEG done by Dr. Karel Jarvis was not. MRI results are suggestive of MS. We tried to gradually lower dose of risperidone but when it was decrease to 2 mg she started to experience AH again. Around the same time she had her second seizure episode. Risperidone now is at 3 mg and she denies having AH. She has also been started on oxcarbazepine for seizures.   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 Next appointment in 6 weeks.The plan was discussed with patient who had an opportunity to ask questions and these were all answered. I spend15 minutes invideoconferencingwith the patient.   Magdalene Patricia, MD 01/27/2020, 10:43 AM

## 2020-02-02 ENCOUNTER — Ambulatory Visit
Admission: RE | Admit: 2020-02-02 | Discharge: 2020-02-02 | Disposition: A | Discharge status: Home / Self Care | Payer: BC Managed Care – PPO | Source: Ambulatory Visit | Attending: Neurology | Admitting: Neurology

## 2020-02-02 ENCOUNTER — Other Ambulatory Visit: Payer: Self-pay

## 2020-02-02 DIAGNOSIS — R200 Anesthesia of skin: Secondary | ICD-10-CM | POA: Diagnosis not present

## 2020-02-02 DIAGNOSIS — M5124 Other intervertebral disc displacement, thoracic region: Secondary | ICD-10-CM | POA: Diagnosis not present

## 2020-02-02 DIAGNOSIS — G379 Demyelinating disease of central nervous system, unspecified: Secondary | ICD-10-CM | POA: Diagnosis not present

## 2020-02-02 DIAGNOSIS — G35 Multiple sclerosis: Secondary | ICD-10-CM

## 2020-02-02 IMAGING — MR MR THORACIC SPINE WO/W CM
4 of 9 series · 11 of 48 positions shown · IV contrast (16ml Multihance)
Comparison: Cranial MRI [DATE].

CLINICAL DATA: Demyelinating disease. Possible multiple sclerosis.
Right hand numbness for 1 year.

EXAM:
MRI CERVICAL AND THORACIC SPINE WITHOUT AND WITH CONTRAST
TECHNIQUE: Multiplanar and multiecho pulse sequences of the cervical spine, to
include the craniocervical junction and cervicothoracic junction,
and the thoracic spine, were obtained without and with intravenous
contrast.
CONTRAST:  15mL MULTIHANCE GADOBENATE DIMEGLUMINE 529 MG/ML IV SOLN

[Series 4: T2 · sagittal · 4.0mm · 0.59mm/px · 3 of 17 slices shown (1 of 4)]
[im 1/17]
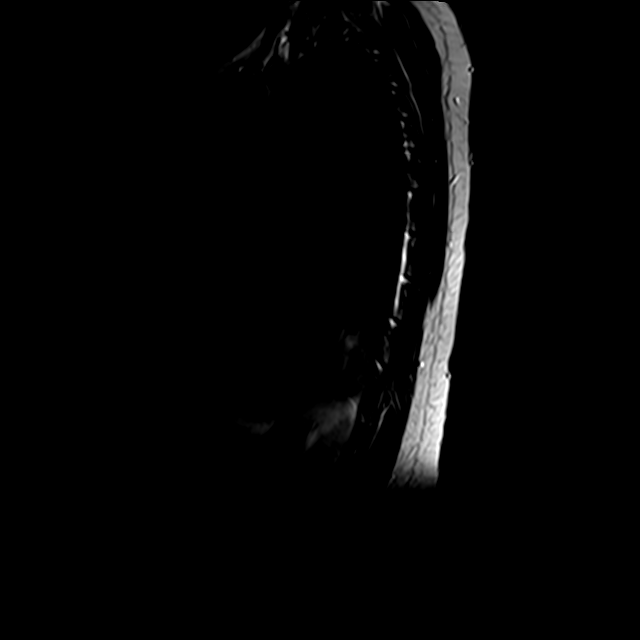
[im 9/17]
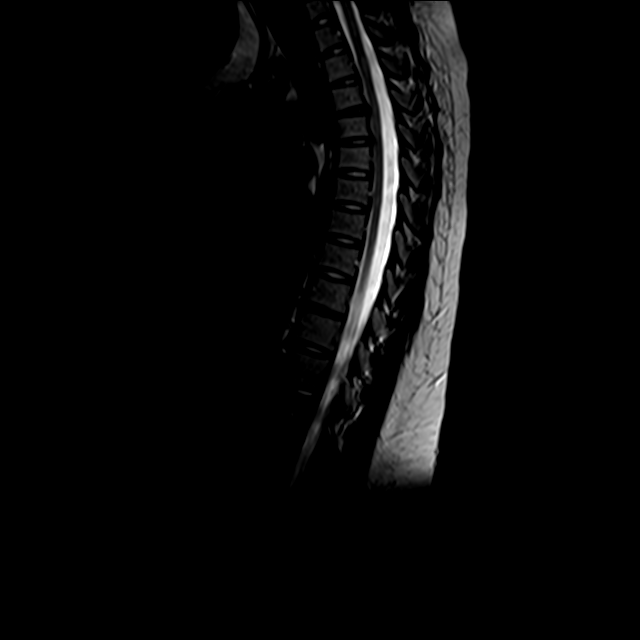
[im 17/17]
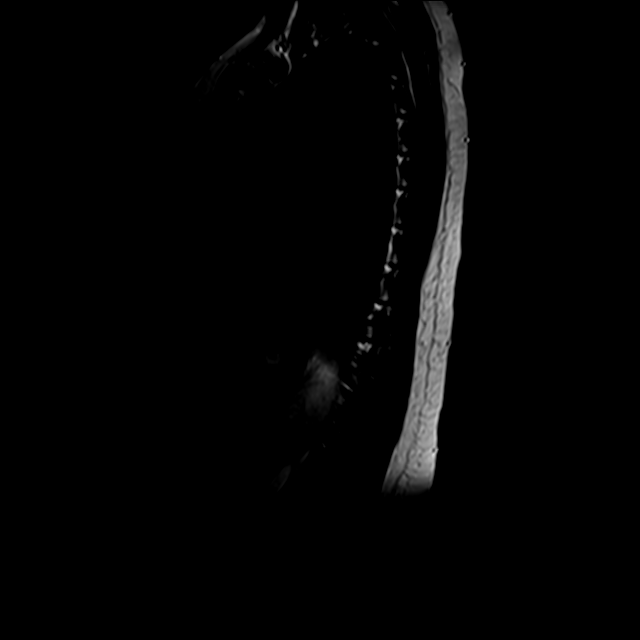

[Series 7: T2 · axial · 4.0mm · 0.39mm/px · z∈[-184,-50]mm · 3 of 39 slices shown (2 of 4)]
[im 6/39]
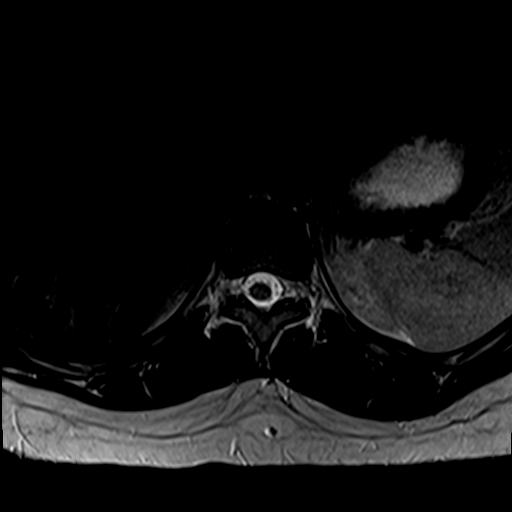
[im 22/39]
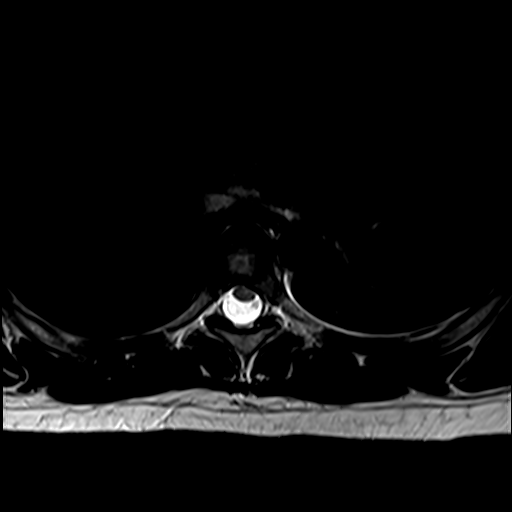
[im 33/39]
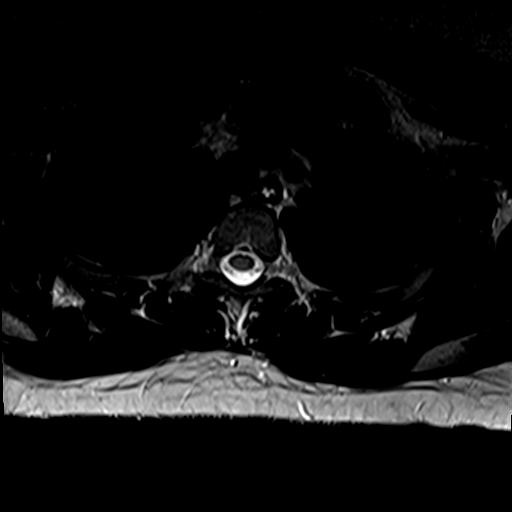

[Series 8: T2 · axial · 4.0mm · 0.39mm/px · z∈[-184,-50]mm · 3 of 39 slices shown (3 of 4)]
[im 6/39]
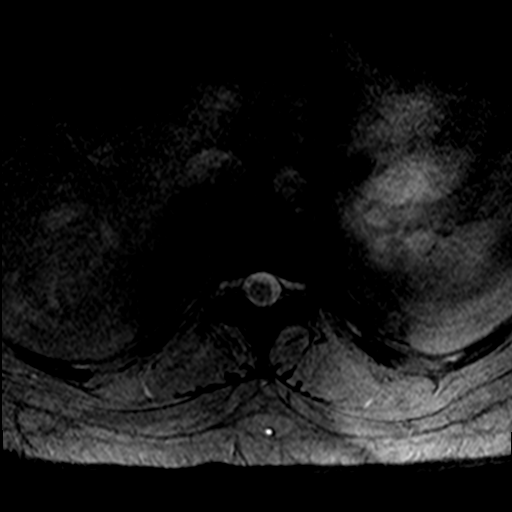
[im 22/39]
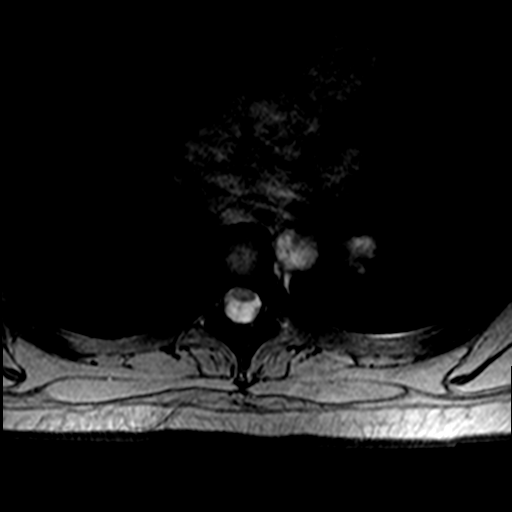
[im 33/39]
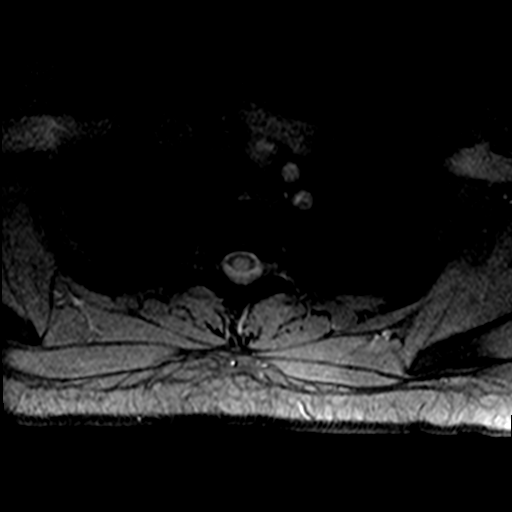

[Series 10: T2 · sagittal · 4.0mm · 0.42mm/px · 2 of 12 slices shown (4 of 4)]
[im 1/12]
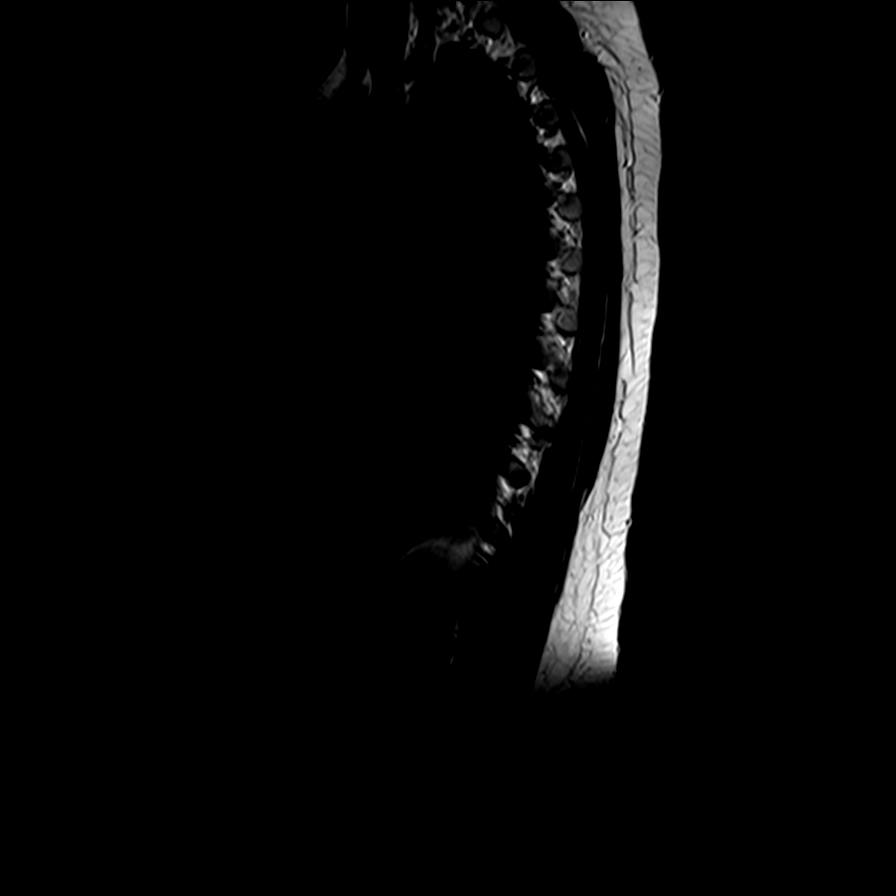
[im 6/12]
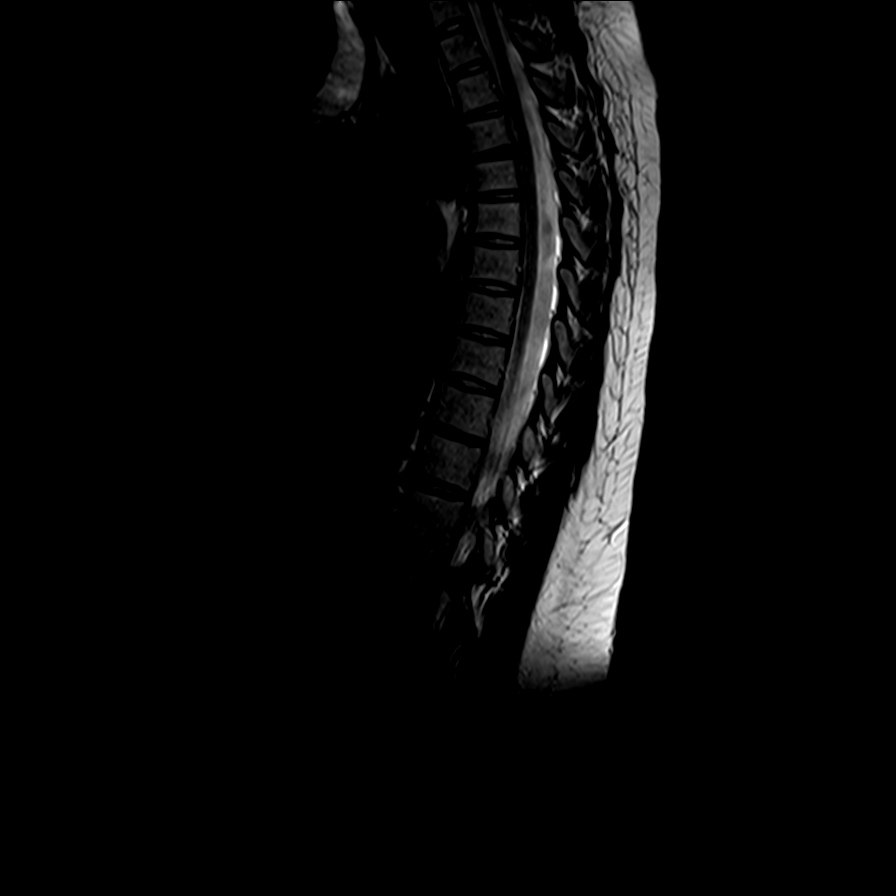

[11 of 48 positions shown; findings below may reference images not displayed]

FINDINGS: MRI CERVICAL SPINE FINDINGS

Alignment: Physiologic

Vertebrae: No focal osseous lesion, marrow edema or abnormal
enhancement.

Cord: The cord appears mildly expanded and demonstrates diffuse
heterogeneous T2 signal, especially from C1 through C5. Central
demyelinating lesion measures up to 8 mm on image [DATE]. No evidence
of cord hemorrhage or abnormal enhancement.

Posterior Fossa, vertebral arteries, paraspinal tissues: Multiple
demyelinating lesions are again noted within the white matter of the
posterior fossa, unchanged from recent brain MRI. There are
bilateral vertebral artery flow voids. No acute paraspinal
abnormalities. Mildly prominent cervical lymph nodes bilaterally are
likely reactive.

Disc levels:

The cervical discs appear normal. There is no disc herniation,
spinal stenosis or nerve root encroachment.

MRI THORACIC SPINE FINDINGS

Alignment:  Normal.

Vertebrae: No evidence of acute fracture, marrow edema or abnormal
enhancement.

Cord: The thoracic cord appears normal in caliber. The conus
medullaris extends to L1-2 disc space level. There is minimal
heterogeneity in the proximal thoracic cord with inferior extension
to the T3-4 disc space level. No evidence of cord hemorrhage or
abnormal enhancement.

Paraspinal and other soft tissues: No paraspinal abnormalities.

Disc levels:

Tiny right-sided disc protrusions at T3-4 and T4-5. No large disc
herniation, cord deformity or foraminal compromise.
IMPRESSION: 1. Diffusely heterogeneous T2 signal within the cervical and
proximal thoracic cord consistent with demyelinating disease, likely
multiple sclerosis. No evidence of cord hemorrhage or abnormal
enhancement.
2. Grossly stable demyelinating lesions within the white matter of
the posterior fossa compared with recent brain MRI.
3. Tiny right-sided thoracic disc protrusions. No significant disc
herniation, spinal stenosis or nerve root encroachment.

## 2020-02-02 IMAGING — MR MR CERVICAL SPINE WO/W CM
4 of 8 series · 24 of 48 positions shown · IV contrast (multihance)
Comparison: Cranial MRI [DATE].

CLINICAL DATA: Demyelinating disease. Possible multiple sclerosis.
Right hand numbness for 1 year.

EXAM:
MRI CERVICAL AND THORACIC SPINE WITHOUT AND WITH CONTRAST
TECHNIQUE: Multiplanar and multiecho pulse sequences of the cervical spine, to
include the craniocervical junction and cervicothoracic junction,
and the thoracic spine, were obtained without and with intravenous
contrast.
CONTRAST:  15mL MULTIHANCE GADOBENATE DIMEGLUMINE 529 MG/ML IV SOLN

[Series 3: T1 · sagittal · 3.0mm · 0.41mm/px · 4 of 13 slices shown (1 of 2)]
[im 1/13]
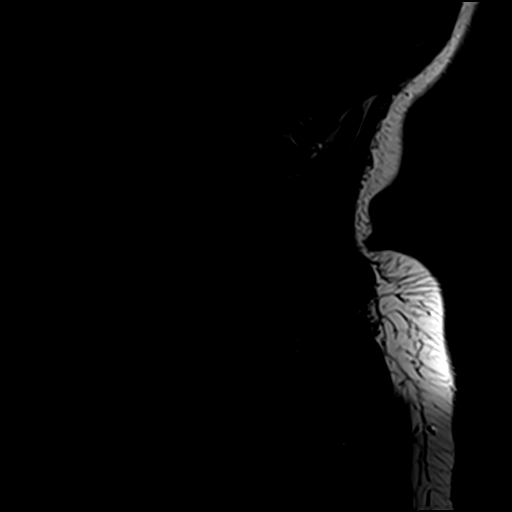
[im 5/13]
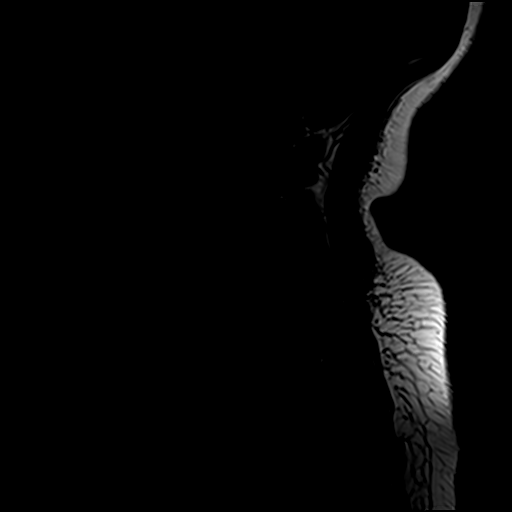
[im 9/13]
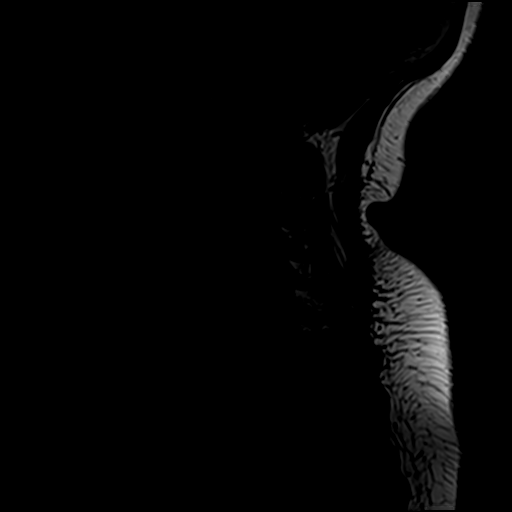
[im 13/13]
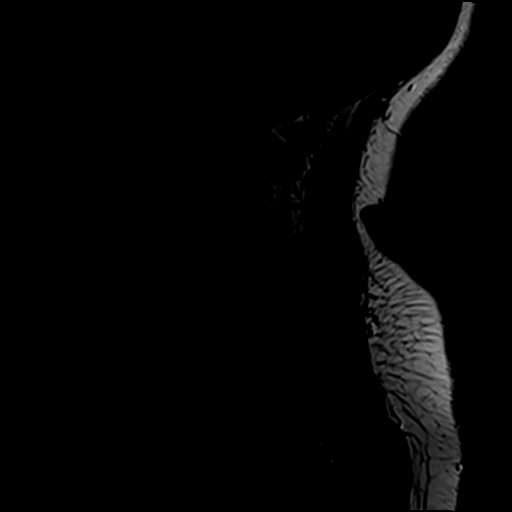

[Series 5: T2 · axial · 3.0mm · 0.70mm/px · z∈[-30,+61]mm · 8 of 26 slices shown (1 of 2)]
[im 1/26]
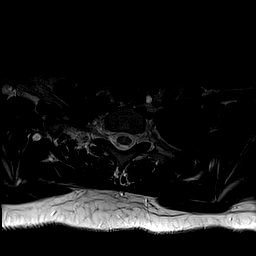
[im 4/26]
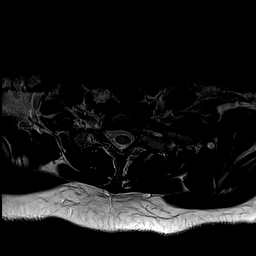
[im 8/26]
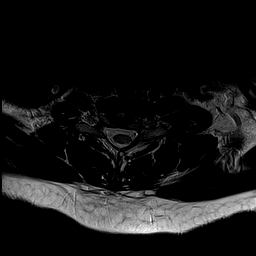
[im 11/26]
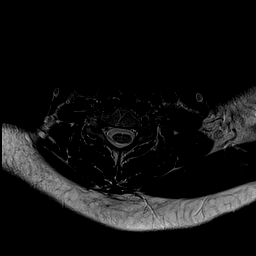
[im 15/26]
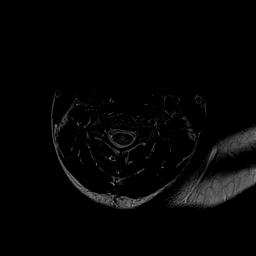
[im 18/26]
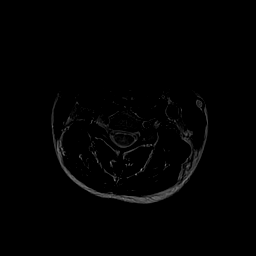
[im 22/26]
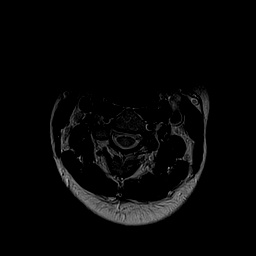
[im 26/26]
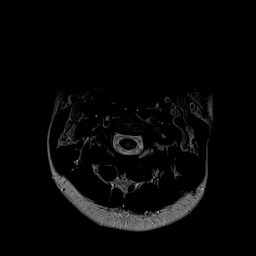

[Series 6: T1 · axial · 3.0mm · 0.35mm/px · z∈[-30,+61]mm · 8 of 26 slices shown (2 of 2)]
[im 1/26]
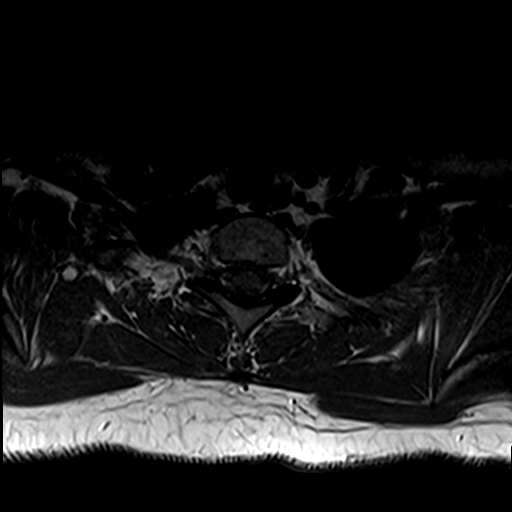
[im 4/26]
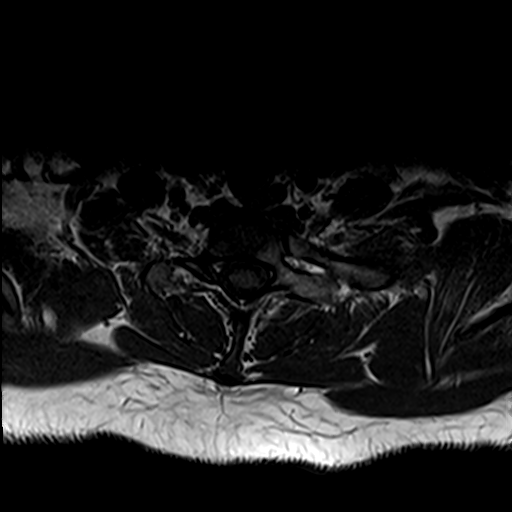
[im 8/26]
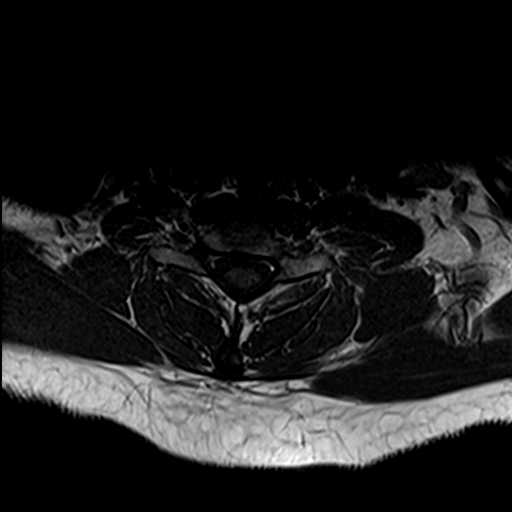
[im 11/26]
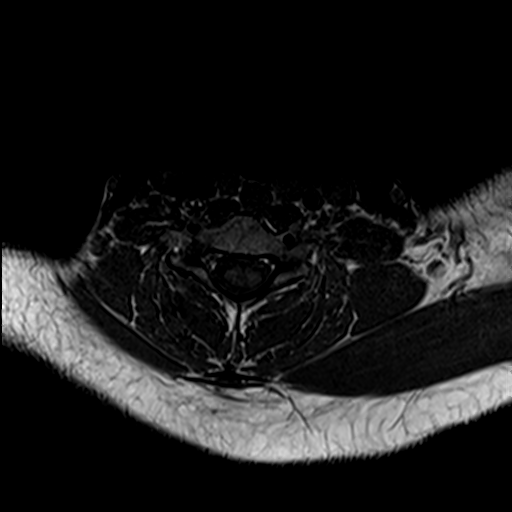
[im 15/26]
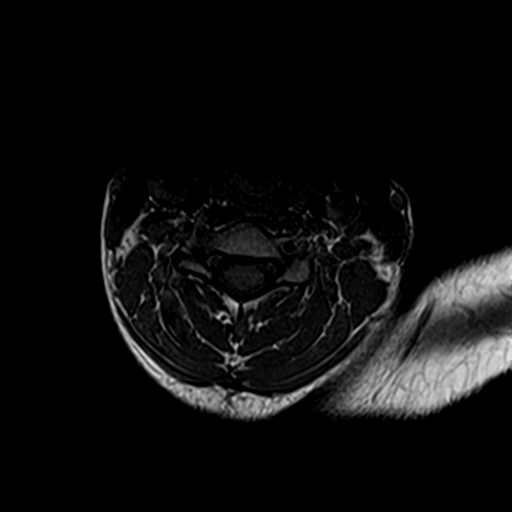
[im 18/26]
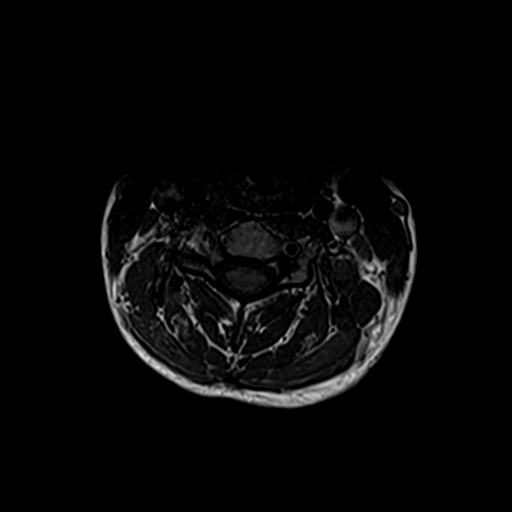
[im 22/26]
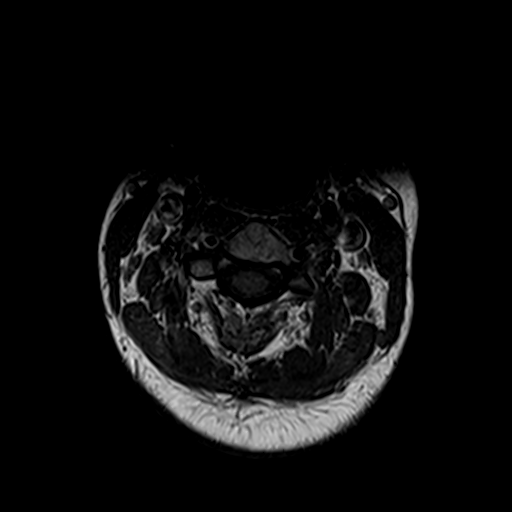
[im 26/26]
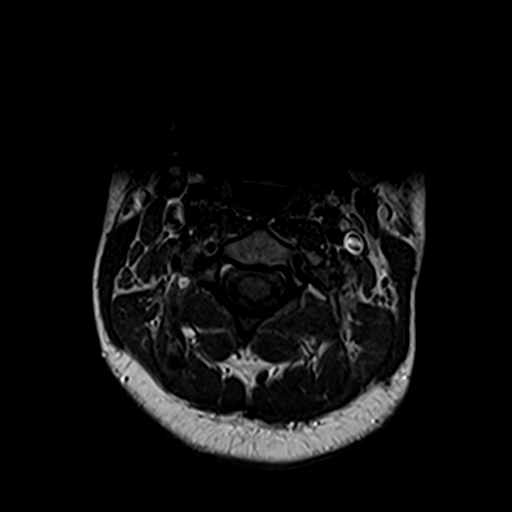

[Series 7: T2 · sagittal · 3.0mm · 0.41mm/px · 4 of 13 slices shown (2 of 2)]
[im 1/13]
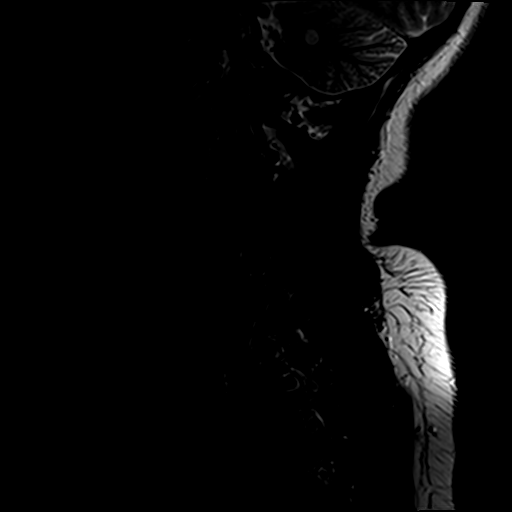
[im 5/13]
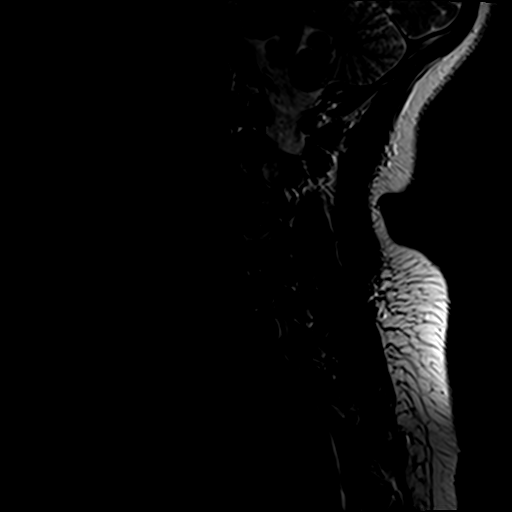
[im 9/13]
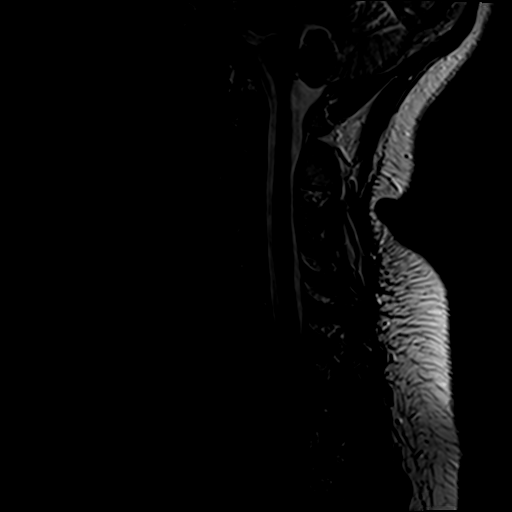
[im 13/13]
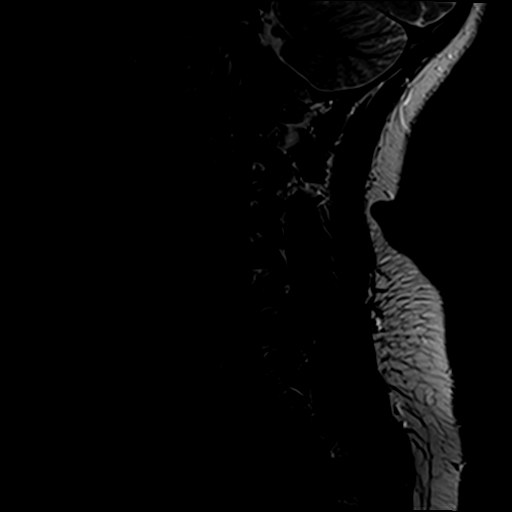

[24 of 48 positions shown; findings below may reference images not displayed]

FINDINGS: MRI CERVICAL SPINE FINDINGS

Alignment: Physiologic

Vertebrae: No focal osseous lesion, marrow edema or abnormal
enhancement.

Cord: The cord appears mildly expanded and demonstrates diffuse
heterogeneous T2 signal, especially from C1 through C5. Central
demyelinating lesion measures up to 8 mm on image [DATE]. No evidence
of cord hemorrhage or abnormal enhancement.

Posterior Fossa, vertebral arteries, paraspinal tissues: Multiple
demyelinating lesions are again noted within the white matter of the
posterior fossa, unchanged from recent brain MRI. There are
bilateral vertebral artery flow voids. No acute paraspinal
abnormalities. Mildly prominent cervical lymph nodes bilaterally are
likely reactive.

Disc levels:

The cervical discs appear normal. There is no disc herniation,
spinal stenosis or nerve root encroachment.

MRI THORACIC SPINE FINDINGS

Alignment:  Normal.

Vertebrae: No evidence of acute fracture, marrow edema or abnormal
enhancement.

Cord: The thoracic cord appears normal in caliber. The conus
medullaris extends to L1-2 disc space level. There is minimal
heterogeneity in the proximal thoracic cord with inferior extension
to the T3-4 disc space level. No evidence of cord hemorrhage or
abnormal enhancement.

Paraspinal and other soft tissues: No paraspinal abnormalities.

Disc levels:

Tiny right-sided disc protrusions at T3-4 and T4-5. No large disc
herniation, cord deformity or foraminal compromise.
IMPRESSION: 1. Diffusely heterogeneous T2 signal within the cervical and
proximal thoracic cord consistent with demyelinating disease, likely
multiple sclerosis. No evidence of cord hemorrhage or abnormal
enhancement.
2. Grossly stable demyelinating lesions within the white matter of
the posterior fossa compared with recent brain MRI.
3. Tiny right-sided thoracic disc protrusions. No significant disc
herniation, spinal stenosis or nerve root encroachment.

## 2020-02-02 MED ORDER — GADOBENATE DIMEGLUMINE 529 MG/ML IV SOLN
15.0000 mL | Freq: Once | INTRAVENOUS | Status: AC | PRN
  Administered 2020-02-02: 15 mL via INTRAVENOUS

## 2020-02-04 ENCOUNTER — Encounter: Payer: Self-pay | Admitting: Neurology

## 2020-02-04 ENCOUNTER — Other Ambulatory Visit: Payer: Self-pay

## 2020-02-04 ENCOUNTER — Other Ambulatory Visit: Payer: BC Managed Care – PPO

## 2020-02-04 ENCOUNTER — Ambulatory Visit (INDEPENDENT_AMBULATORY_CARE_PROVIDER_SITE_OTHER): Payer: BC Managed Care – PPO | Admitting: Neurology

## 2020-02-04 VITALS — BP 123/74 | HR 74 | Resp 18 | Ht 67.0 in | Wt 180.0 lb

## 2020-02-04 DIAGNOSIS — G40009 Localization-related (focal) (partial) idiopathic epilepsy and epileptic syndromes with seizures of localized onset, not intractable, without status epilepticus: Secondary | ICD-10-CM

## 2020-02-04 DIAGNOSIS — G35 Multiple sclerosis: Secondary | ICD-10-CM

## 2020-02-04 LAB — COMPREHENSIVE METABOLIC PANEL
ALT: 12 U/L (ref 0–35)
AST: 12 U/L (ref 0–37)
Albumin: 4.1 g/dL (ref 3.5–5.2)
Alkaline Phosphatase: 76 U/L (ref 39–117)
BUN: 7 mg/dL (ref 6–23)
CO2: 30 mEq/L (ref 19–32)
Calcium: 9.5 mg/dL (ref 8.4–10.5)
Chloride: 104 mEq/L (ref 96–112)
Creatinine, Ser: 0.72 mg/dL (ref 0.40–1.20)
GFR: 119.77 mL/min (ref >60.00)
Glucose, Bld: 90 mg/dL (ref 70–99)
Potassium: 3.9 mEq/L (ref 3.5–5.1)
Sodium: 140 mEq/L (ref 135–145)
Total Bilirubin: 0.3 mg/dL (ref 0.2–1.2)
Total Protein: 7.8 g/dL (ref 6.0–8.3)

## 2020-02-04 LAB — VITAMIN B12: Vitamin B-12: 398 pg/mL (ref 211–911)

## 2020-02-04 LAB — CBC
HCT: 44.3 % (ref 36.0–46.0)
Hemoglobin: 14.8 g/dL (ref 12.0–15.0)
MCHC: 33.5 g/dL (ref 30.0–36.0)
MCV: 85.3 fl (ref 78.0–100.0)
Platelets: 280 10*3/uL (ref 150.0–400.0)
RBC: 5.2 Mil/uL — ABNORMAL HIGH (ref 3.87–5.11)
RDW: 13.6 % (ref 11.5–15.5)
WBC: 5 10*3/uL (ref 4.0–10.5)

## 2020-02-04 LAB — VITAMIN D 25 HYDROXY (VIT D DEFICIENCY, FRACTURES): VITD: 20.38 ng/mL — ABNORMAL LOW (ref 30.00–100.00)

## 2020-02-04 NOTE — Patient Instructions (Addendum)
1. Bloodwork for CBC, CMP, ACE, ANA, vitamin D, vitamin B12, hepatitis B core Ab, NMO Ab, Stratify JC virus, Immunoglobulins, Quantiferon-TB  2. We will plan to get Tysabri started  3. Continue oxcarbazepine 300mg  twice a day  4. Continue Risperdal and follow-up with Psychiatry  5. Follow-up in 2 months, call for any changes   Seizure Precautions: 1. If medication has been prescribed for you to prevent seizures, take it exactly as directed.  Do not stop taking the medicine without talking to your doctor first, even if you have not had a seizure in a long time.   2. Avoid activities in which a seizure would cause danger to yourself or to others.  Don't operate dangerous machinery, swim alone, or climb in high or dangerous places, such as on ladders, roofs, or girders.  Do not drive unless your doctor says you may.  3. If you have any warning that you may have a seizure, lay down in a safe place where you can't hurt yourself.    4.  No driving for 6 months from last seizure, as per [facility].   Please refer to the following link on the [employer]'s website for more information: http://www.epilepsyfoundation.org/answerplace/Social/driving/drivingu.cfm   5.  Maintain good sleep hygiene. Avoid alcohol.  6.  Notify your neurology if you are planning pregnancy or if you become pregnant.  7.  Contact your doctor if you have any problems that may be related to the medicine you are taking.  8.  Call 911 and bring the patient back to the ED if:        A.  The seizure lasts longer than 5 minutes.       B.  The patient doesn't awaken shortly after the seizure  C.  The patient has new problems such as difficulty seeing, speaking or moving  D.  The patient was injured during the seizure  E.  The patient has a temperature over 102 F (39C)  F.  The patient vomited and now is having trouble breathing

## 2020-02-04 NOTE — Progress Notes (Signed)
NEUROLOGY FOLLOW UP OFFICE NOTE  Stephanie Cabrera [id number] [birth year]  HISTORY OF PRESENT ILLNESS: I had the pleasure of seeing Stephanie Cabrera in follow-up in the neurology clinic on 02/04/2020.  The patient was last seen 3 weeks ago for seizure and MRI brain concerning for MS. She is accompanied by her father Greggory Stallion who helps supplement the history today.  Records and images were personally reviewed where available. Since her last visit, she had another seizure that occurred in her sleep at 3am last 01/12/20. Her mother heard her yell out and found her having a GTC that lasted less than a minute. She was confused after, no focal weakness. She was started on oxcarbazepine 300mg  BID. She has since also finished a 3-day course of IV Solumedrol. She presents to discuss MRI cervical and thoracic spine with and without contrast done 02/02/20. The cervical cord appears mildly expanded with diffuse heterogeneous T2 signal from C1 through C5, no abnormal enhancement. There is minimal heterogeneity in the proximal thoracic cord with inferior extension to the T3-4 disc space level, no abnormal enhancement.   She reports doing well. She denies any seizures since 01/12/2020, no side effects on oxcarbazepine 300mg  BID. She is not having the auditory hallucinations, taking Risperdal 3mg  daily. She still has a little of the constant right hand numbness. She denies any headaches, dizziness, vision changes, no falls. Sleep is okay. She became tearful as we discussed the diagnosis.   History on Initial Assessment 11/02/2019: This is a pleasant 19 year old right-handed woman with a history of anxiety presenting for evaluation of seizure-like activity that occurred on 09/02/2019. She was in her usual state of health until 09/02/19 when she recalls sitting on the commode then waking up in the ambulance. Her mother reports that she was tired and took 4 tablets of melatonin 3mg  which her mother thought was too much. She  increased her water intake and was in the bathroom when she called her mother saying her right hand was going numb, then she started losing consciousness and fell off the toilet. Her mother laid her on the ground and saw her whole body stiffening and shaking for 3 minutes. She bit her tongue. When EMS arrived, she could say her name and follow instructions, no focal weakness. She left the ER AMA. She saw her PCP on 09/15/19 and reported increassed stress worse after her baby was born in 08/2018. A week later, she was brought to the ER on 6/17 for IVC because she started destroying her TV and lamp in her bedroom. Family walked into the room and she was naked on her dresser with items around her. Family calmed her down and she indicated no memory of the event. According to IVC paperwork, this has happened 2 times previously, she had told them she has been speaking to God. She had not been sleeping well. Head CT no acute changes. 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 Per notes, she had been having auditory hallucinations for several weeks to months, however her mother states she started hallucinating after the seizure in May. She went back to [facility] on 7/8 due to recurrence of auditory hallucinations. Dose increased to 4mg  qhs yesterday. She denies any further hallucinations. She states her mood is good, she denies any depression or anxiety. Her mother reports that when the seizure occurred, her anxiety was bad. 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 She is now sleeping better.   She has had constant numbness in her hands since she gave birth a year ago, R>L. She denies any headaches, dizziness, diplopia, neck/back pain, bowel/bladder dysfunction. She has occasional episodes of a metallic taste in her mouth for a few minutes that occur a couple of times a month. She has occasional  jerks in her back. Her mother denies any staring/unresponsive episodes. She lives with her mother and 28 year old daughter [name]. Her mother feels she is back to baseline and is mostly worried about her memory. Since the seizure, she cannot remember where she puts things. Her mother administers her medication. She has not been driving. She denies any alcohol or illicit drug use.   Epilepsy Risk Factors:  Her paternal grandfather had seizures. Otherwise she had a normal birth and early development.  There is no history of febrile convulsions, CNS infections such as meningitis/encephalitis, significant traumatic brain injury, neurosurgical procedures.  Routine and 24-hour EEG in 11/2019 which were abnormal due to focal slowing over the bilateral temporal regions, right greater than left. There was also note of frontal intermittent rhythmic delta activity (FIRDA). No epileptiform discharges seen, typical events not captured.  MRI brain with and without contrast in 01/2020 showed numerous white matter lesions, many are periventricular, some are subcortical and deep white matter. There is a 10x56mm lesion in the left brachium pontis with additional smaller lesions in the brachium pontis bilaterally. There was some restricted diffusion in the left mid-frontal lobe, multiple enhancing lesions in the anterior frontal lobes bilaterally, right middle frontal lobe, right parietal periventricular white matter. Findings felt compatible with multiple sclerosis.  PAST MEDICAL HISTORY: Past Medical History:  Diagnosis Date  . Allergy   . Medical history non-contributory   . Seizures (HCC)     MEDICATIONS: Current Outpatient Medications on File Prior to Visit  Medication Sig Dispense Refill  . Oxcarbazepine (TRILEPTAL) 300 MG tablet Take 1/2 tablet twice a day for 3 days, then increase to 1 tablet twice a day 60 tablet 5  . [START ON 02/19/2020] risperiDONE (RISPERDAL) 3 MG tablet Take 1 tablet (3 mg total) by  mouth at bedtime. 30 tablet 0   No current facility-administered medications on file prior to visit.    ALLERGIES: No Known Allergies  FAMILY HISTORY: Family History  Problem Relation Age of Onset  . Diabetes Paternal Grandmother   . Diabetes Paternal Grandfather   . Hypertension Paternal Grandfather     SOCIAL HISTORY: Social History   Socioeconomic History  . Marital status: Single    Spouse name: Not on file  . Number of children: 1  . Years of education: Not on file  . Highest education level: Not on file  Occupational History  . Not on file  Tobacco Use  . Smoking status: Never Smoker  . Smokeless tobacco: Never Used  Vaping Use  . Vaping Use: Never used  Substance and Sexual Activity  . Alcohol use: Never    Alcohol/week: 0.0 standard drinks  . Drug use: Never  . Sexual activity: Not Currently    Birth control/protection: None  Other Topics Concern  . Not on file  Social History Narrative   Right Handed   One Story    Lives with mom and daughter   No Caffeine   Social Determinants of Health   Financial Resource Strain:   . Difficulty of Paying Living Expenses: Not on file  Food Insecurity:   . Worried About Programme researcher, broadcasting/film/video in the Last Year: Not on file  . Ran Out of Food in the Last Year: Not on file  Transportation Needs:   . Lack of Transportation (Medical): Not on file  . Lack of Transportation (Non-Medical): Not on file  Physical Activity:   . Days of Exercise per Week: Not on file  . Minutes of Exercise per Session: Not on file  Stress:   . Feeling of Stress : Not on file  Social Connections:   . Frequency of Communication with Friends and Family: Not on file  . Frequency of Social Gatherings with Friends and Family: Not on file  . Attends Religious Services: Not on file  . Active Member of Clubs or Organizations: Not on file  . Attends Banker Meetings: Not on file  . Marital Status: Not on file  Intimate Partner  Violence:   . Fear of Current or Ex-Partner: Not on file  . Emotionally Abused: Not on file  . Physically Abused: Not on file  . Sexually Abused: Not on file     PHYSICAL EXAM: Vitals:   02/04/20 1341  BP: 123/74  Pulse: 74  Resp: 18  SpO2: 98%   General: No acute distress Head:  Normocephalic/atraumatic Skin/Extremities: No rash, no edema Neurological Exam: alert and oriented to person, place, and time. No aphasia or dysarthria. Fund of knowledge is appropriate.  Recent and remote memory are intact.  Attention and concentration are normal.   Cranial nerves: Pupils equal, round. Extraocular movements intact with no nystagmus. Visual fields full.  No facial asymmetry.  Motor: Bulk and tone normal, muscle strength 5/5 throughout with no pronator drift.  Reflexes +2 throughout. Sensation intact to cold, pin, vibration sense. Finger to nose testing intact.  Gait narrow-based and steady, able to tandem walk adequately.    IMPRESSION: This is a pleasant 19 yo RH woman with a history of anxiety, new onset auditory hallucinations diagnosed with a brief psychotic episode last 09/23/2019 when she was found confused. Curiously, her mother witnessed an episode concerning for seizure 3 weeks prior. Her neurological exam is normal. Her EEG had shown bilateral temporal focal slowing, right greater than left, no epileptiform discharges. She had a nocturnal seizure last 01/12/2020 and is now on oxcarbazepine 300mg  BID. She denies any further seizures, she is not having the auditory hallucinations presently. Continue oxcarbazepine 300mg  BID and follow-up with Psychiatry. We discussed MRI brain, cervical, and thoracic spine findings, consistent with MS. We discussed diagnosis, prognosis, and treatment. Additional bloodwork will be done, including CBC, CMP, vitamin B12, vitamin D, ACE, ANA, vitamin D, vitamin B12, hepatitis B core Ab, Stratify JC virus, Immunoglobulins, Quantiferon-TB in preparation for treatment  with Tysabri. Side effects, including rare risk for PML, were discussed. We will also do NMO Ab serum testing since the cervical cord lesion was longitudinally extensive. Her neurological exam today is normal. Further information regarding MS and Tysabri provided today. We again discussed  driving laws, no driving until 6 months seizure-free. Follow-up in 2 months, they know to call for any changes.    Thank you for allowing me to participate in her care.  Please do not hesitate to call for any questions or concerns.   , M.D.   CC: Dr. 

## 2020-02-08 LAB — ANTI-NUCLEAR AB-TITER (ANA TITER)
ANA TITER: 1:40 {titer} — ABNORMAL HIGH
ANA Titer 1: 1:40 {titer} — ABNORMAL HIGH

## 2020-02-08 LAB — ANGIOTENSIN CONVERTING ENZYME: Angiotensin-Converting Enzyme: 30 U/L (ref 9–67)

## 2020-02-08 LAB — NEUROMYELITIS OPTICA AUTOAB, IGG: NMO IgG Autoantibodies: <1.5 U/mL (ref 0.0–3.0)

## 2020-02-08 LAB — QUANTIFERON-TB GOLD PLUS
Mitogen-NIL: >10 IU/mL
NIL: 0.03 IU/mL
QuantiFERON-TB Gold Plus: NEGATIVE
TB1-NIL: <0 IU/mL
TB2-NIL: <0 IU/mL

## 2020-02-08 LAB — STRATIFY JCV AB (W/ INDEX) W/ RFLX
Index Value: 3.18 — ABNORMAL HIGH
Stratify JCV (TM) Ab w/Reflex Inhibition: POSITIVE — AB

## 2020-02-08 LAB — HEPATITIS B CORE AB W/REFLEX: Hep B Core Total Ab: NEGATIVE

## 2020-02-08 LAB — ANA: Anti Nuclear Antibody (ANA): POSITIVE — AB

## 2020-02-08 LAB — IMMUNOGLOBULINS A/E/G/M, SERUM
IgA/Immunoglobulin A, Serum: 319 mg/dL (ref 87–352)
IgE (Immunoglobulin E), Serum: 63 IU/mL (ref 6–495)
IgG (Immunoglobin G), Serum: 1690 mg/dL — ABNORMAL HIGH (ref 586–1602)
IgM (Immunoglobulin M), Srm: 97 mg/dL (ref 26–217)

## 2020-02-15 ENCOUNTER — Telehealth: Payer: Self-pay | Admitting: Neurology

## 2020-02-15 NOTE — Telephone Encounter (Signed)
Left VM

## 2020-02-16 MED ORDER — VITAMIN D (ERGOCALCIFEROL) 1.25 MG (50000 UNIT) PO CAPS: 50000.0000 [IU] | ORAL_CAPSULE | ORAL | 0 refills | Status: DC

## 2020-02-16 NOTE — Telephone Encounter (Signed)
Spoke to mother. Stephanie Cabrera has been feeling pretty good, she is at work. She has been sleepy with Risperdal 3mg  daily, no issues. Doing well seizure-free on oxcarbazepine. Discussed bloodwork showing low vitamin D level, start vitamin D 50,000 IU every week for 8 weeks. 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 This would not make her a candidate for Tysabri. Recommend Ocrevus. Discussed side effects. Will proceed with getting approval. F/u as scheduled.

## 2020-02-17 ENCOUNTER — Encounter: Payer: Self-pay | Admitting: Neurology

## 2020-02-17 NOTE — Progress Notes (Addendum)
FOR ALL PA INFO- DO NOT DO CMM AS ERROR OCCURS FOR HER. GO TO [email or web address] AND GO TO SEARCH BAR TYPE: DRUGS THEN GO TO PRESCRIPTION DRUGS AND YOU CAN SEARCH BY NAME OF MED.   11/11- submitted PA forms for Ocrevus PA.   11/15-received fax approval from [facility]; PA approval valid from 02/17/20 to 03/16/21. 1200 Units of Ocrevus; Ref #[name] #: [id number].

## 2020-03-08 ENCOUNTER — Other Ambulatory Visit: Payer: Self-pay

## 2020-03-08 ENCOUNTER — Telehealth (INDEPENDENT_AMBULATORY_CARE_PROVIDER_SITE_OTHER): Payer: BC Managed Care – PPO | Admitting: Psychiatry

## 2020-03-08 DIAGNOSIS — F060 Psychotic disorder with hallucinations due to known physiological condition: Secondary | ICD-10-CM

## 2020-03-08 MED ORDER — RISPERIDONE 1 MG PO TABS: ORAL_TABLET | ORAL | 0 refills | Status: DC

## 2020-03-08 NOTE — Progress Notes (Signed)
BH MD/PA/NP OP Progress Note  03/08/2020 10:39 AM Stephanie Cabrera  MRN:  [id number] Interview was conducted using videoconferencing application and I verified that I was speaking with the correct person using two identifiers. I discussed the limitations of evaluation and management by telemedicine and  the availability of in person appointments. Patient expressed understanding and agreed to proceed. Participants in the visit: patient (location - home); physician (location - home office).  Chief Complaint: None.  HPI: 19yo AAF who was (7/19-22/21)hospitalized at [facility] for a psychotic break. She started to experience hallucinations about 2 months ago, felt confused, having memory problems and per IVC papers started to destroy furniture/TV in her room. She denies ever before having similar experiences (confirmed by her mother). AH appeared suddenly; she denies any new medications, any sudden stress, she does not abuse alcohol or drugs. The only recent potentially stressful event is giving birth to her fists child Stephanie Cabrera (on 09/06/19). There is no psychiatric or substance abuse hx in her biological family. While in [facility] she was started on risperidone 1 mg in AM and 2 at bedtime. AH were described as a single female, unknown voice which at times would tell her to do things like get on bed etc. She felt somewhat sedated after taking morning dose of risperidone. She denies having tremor, muscle twitching, difficulty swallowing. There is perhaps some increase in appetite observed. Her mood is neutral. She does not verbalize any paranoia or other delusional thought content. There is no hx of mania, depression but she admits to some anxiety predating current episode. She has a remote (middle school) hx of cutting. This was her first psychiatric admission. Chart review indicates that on May 27 she had a seizure like episode, another one on 10/6. She stopped working Facilities manager job). Head CT done on 09/23/19 was  normalbut EEG done by Dr. Karel Jarvis was not. MRI results are suggestive of MS. We tried to gradually lower dose of risperidone but when it was decrease to 2 mg she started to experience AH again. Around the same time she had her second seizure episode. Risperidone now is at 3 mg and she denies having AH for the past month. She has also been started on oxcarbazepine for seizures.     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  Past Medical History:  Past Medical History:  Diagnosis Date  . Allergy   . Medical history non-contributory   . Seizures (HCC)     Past Surgical History:  Procedure Laterality Date  . NO PAST SURGERIES      Family Psychiatric History: None.  Family History:  Family History  Problem Relation Age of Onset  . Diabetes Paternal Grandmother   . Diabetes Paternal Grandfather   . Hypertension Paternal Grandfather     Social History:  Social History   Socioeconomic History  . Marital status: Single    Spouse name: Not on file  . Number of children: 1  . Years of education: Not on file  . Highest education level: Not on file  Occupational History  . Not on file  Tobacco Use  . Smoking status: Never Smoker  . Smokeless tobacco: Never Used  Vaping Use  . Vaping Use: Never used  Substance and Sexual Activity  . Alcohol use: Never    Alcohol/week: 0.0 standard drinks  . Drug use: Never  .  Sexual activity: Not Currently    Birth control/protection: None  Other Topics Concern  . Not on file  Social History Narrative   Right Handed   One Story    Lives with mom and daughter   No Caffeine   Social Determinants of Health   Financial Resource Strain:   . Difficulty of Paying Living Expenses: Not on file  Food Insecurity:   . Worried About Programme researcher, broadcasting/film/video in the Last Year: Not on file  . Ran Out of Food in the Last Year: Not on  file  Transportation Needs:   . Lack of Transportation (Medical): Not on file  . Lack of Transportation (Non-Medical): Not on file  Physical Activity:   . Days of Exercise per Week: Not on file  . Minutes of Exercise per Session: Not on file  Stress:   . Feeling of Stress : Not on file  Social Connections:   . Frequency of Communication with Friends and Family: Not on file  . Frequency of Social Gatherings with Friends and Family: Not on file  . Attends Religious Services: Not on file  . Active Member of Clubs or Organizations: Not on file  . Attends Banker Meetings: Not on file  . Marital Status: Not on file    Allergies: No Known Allergies  Metabolic Disorder Labs: Lab Results  Component Value Date   HGBA1C 5.3 09/25/2019   MPG 105.41 09/25/2019   No results found for: PROLACTIN Lab Results  Component Value Date   CHOL 183 09/25/2019   TRIG 60 09/25/2019   HDL 67 09/25/2019   CHOLHDL 2.7 09/25/2019   VLDL 12 09/25/2019   LDLCALC 104 (H) 09/25/2019   Lab Results  Component Value Date   TSH 1.195 09/25/2019   TSH 1.479 09/23/2019    Therapeutic Level Labs: No results found for: LITHIUM No results found for: VALPROATE No components found for:  CBMZ  Current Medications: Current Outpatient Medications  Medication Sig Dispense Refill  . Oxcarbazepine (TRILEPTAL) 300 MG tablet Take 1/2 tablet twice a day for 3 days, then increase to 1 tablet twice a day 60 tablet 5  . risperiDONE (RISPERDAL) 1 MG tablet Take 2 tablets (2 mg total) by mouth at bedtime for 14 days, THEN 1 tablet (1 mg total) at bedtime. 58 tablet 0  . Vitamin D, Ergocalciferol, (DRISDOL) 1.25 MG (50000 UNIT) CAPS capsule Take 1 capsule (50,000 Units total) by mouth every 7 (seven) days. Take 1 capsule once a week for 8 weeks 8 capsule 0   No current facility-administered medications for this visit.       Psychiatric Specialty Exam: Review of Systems  All other systems reviewed and  are negative.   not currently breastfeeding.There is no height or weight on file to calculate BMI.  General Appearance: Casual and Fairly Groomed  Eye Contact:  Good  Speech:  Clear and Coherent and Normal Rate  Volume:  Normal  Mood:  Euthymic  Affect:  Full Range  Thought Process:  Goal Directed and Linear  Orientation:  Full (Time, Place, and Person)  Thought Content: Logical   Suicidal Thoughts:  No  Homicidal Thoughts:  No  Memory:  Immediate;   Good Recent;   Good Remote;   Good  Judgement:  Good  Insight:  Good  Psychomotor Activity:  Normal  Concentration:  Concentration: Good  Recall:  Good  Fund of Knowledge: Good  Language: Good  Akathisia:  Negative  Handed:  Right  AIMS (if indicated): not done  Assets:  Communication Skills Desire for Improvement Financial Resources/Insurance Housing Social Support  ADL's:  Intact  Cognition: WNL  Sleep:  Good   Screenings: AIMS     [facility]  AIMS Total Score 0    AUDIT     [facility]  Alcohol Use Disorder Identification Test Final Score (AUDIT) 0    GAD-7     Office Visit from 09/15/2019 in [facility] [place]  Total GAD-7 Score 6    PHQ2-9     ED from 11/07/2019 in [facility] Office Visit from 09/15/2019 in [facility] [place]  PHQ-2 Total Score 0 2  PHQ-9 Total Score 0 8       Assessment and Plan: 19yo AAF who was (7/19-22/21)hospitalized at [facility] for a psychotic break. She started to experience hallucinations about 2 months ago, felt confused, having memory problems and per IVC papers started to destroy furniture/TV in her room. She denies ever before having similar experiences (confirmed by her mother). AH appeared suddenly; she denies any new medications, any sudden stress, she does not abuse alcohol or  drugs. The only recent potentially stressful event is giving birth to her fists child Stephanie Cabrera (on 09/06/19). There is no psychiatric or substance abuse hx in her biological family. While in [facility] she was started on risperidone 1 mg in AM and 2 at bedtime. AH were described as a single female, unknown voice which at times would tell her to do things like get on bed etc. She felt somewhat sedated after taking morning dose of risperidone. She denies having tremor, muscle twitching, difficulty swallowing. There is perhaps some increase in appetite observed. Her mood is neutral. She does not verbalize any paranoia or other delusional thought content. There is no hx of mania, depression but she admits to some anxiety predating current episode. She has a remote (middle school) hx of cutting. This was her first psychiatric admission. Chart review indicates that on May 27 she had a seizure like episode, another one on 10/6. She stopped working Facilities manager job). Head CT done on 09/23/19 was normalbut EEG done by Dr. Karel Jarvis was not. MRI results are suggestive of MS. We tried to gradually lower dose of risperidone but when it was decrease to 2 mg she started to experience AH again. Around the same time she had her second seizure episode. Risperidone now is at 3 mg and she denies having AH for the past month. She has also been started on oxcarbazepine for seizures.   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 Next appointment in 5 weeks.The plan was discussed with patient who had an opportunity to ask questions and these were all answered. I spend15 minutes invideoconferencingwith the patient.   Magdalene Patricia, MD 03/08/2020, 10:39 AM

## 2020-04-04 ENCOUNTER — Ambulatory Visit: Payer: BC Managed Care – PPO | Admitting: Neurology

## 2020-04-06 ENCOUNTER — Telehealth: Payer: Self-pay | Admitting: Neurology

## 2020-04-06 NOTE — Telephone Encounter (Signed)
Advised tylenol or advil. Suggested she call back PCP.

## 2020-04-06 NOTE — Telephone Encounter (Signed)
Patients mother called to report that patient was diagnosed with covid recently. She is coughing a lot and it is causing bad headaches. Her PCP was not in the office so she was calling to ask Dr Karel Jarvis if she had any advise on what to take or if she needed to wait until the PCP is back in the office. Please call

## 2020-04-10 ENCOUNTER — Encounter: Payer: Self-pay | Admitting: Family Medicine

## 2020-04-10 ENCOUNTER — Telehealth (INDEPENDENT_AMBULATORY_CARE_PROVIDER_SITE_OTHER): Payer: BC Managed Care – PPO | Admitting: Family Medicine

## 2020-04-10 ENCOUNTER — Other Ambulatory Visit: Payer: Self-pay

## 2020-04-10 DIAGNOSIS — U071 COVID-19: Secondary | ICD-10-CM

## 2020-04-10 NOTE — Progress Notes (Signed)
Patient ID: Stephanie Cabrera, female   DOB: Feb 15, 2001, 20 y.o.   MRN: [id number]  This visit type was conducted due to national recommendations for restrictions regarding the COVID-19 pandemic in an effort to limit this patient's exposure and mitigate transmission in our community.   Virtual Visit via Telephone Note  I connected with Stephanie Cabrera on 04/10/20 at  2:30 PM EST by telephone and verified that I am speaking with the correct person using two identifiers.   I discussed the limitations, risks, security and privacy concerns of performing an evaluation and management service by telephone and the availability of in person appointments. I also discussed with the patient that there may be a patient responsible charge related to this service. The patient expressed understanding and agreed to proceed.  Location patient: home Location provider: work or home office Participants present for the call: patient, provider Patient did not have a visit in the prior 7 days to address this/these issue(s).   History of Present Illness: Stephanie Cabrera states she had onset last Tuesday of headache, nasal congestion, chills, minimal cough.  Never had any fever.  She went Wednesday and had Covid testing which came back positive.  Overall, she feels better.  Her major complaint is increased nasal congestion.  No dyspnea.  No nausea or vomiting.  She is not tried any over-the-counter medications thus far for her congestion.  Her cough is very minimal.  Past Medical History:  Diagnosis Date  . Allergy   . Medical history non-contributory   . Seizures (HCC)    Past Surgical History:  Procedure Laterality Date  . NO PAST SURGERIES      reports that she has never smoked. She has never used smokeless tobacco. She reports that she does not drink alcohol and does not use drugs. family history includes Diabetes in her paternal grandfather and paternal grandmother; Hypertension in her paternal  grandfather. No Known Allergies    Observations/Objective: Patient sounds cheerful and well on the phone. I do not appreciate any SOB. Speech and thought processing are grossly intact. Patient reported vitals:  Assessment and Plan:  COVID-19 infection.  Patient complains mostly at this point of nasal congestion.  Overall improving  -Suggest that she try over-the-counter Flonase 1 or 2 sprays per nostril once daily -Consider short-term use of Sudafed over-the-counter and we reviewed potential side effects  Follow Up Instructions:  -Follow-up as needed for any increased shortness of breath or other concerns   [id number] 5-10 [id number] 11-20 [id number] 21-30 I did not refer this patient for an OV in the next 24 hours for this/these issue(s).  I discussed the assessment and treatment plan with the patient. The patient was provided an opportunity to ask questions and all were answered. The patient agreed with the plan and demonstrated an understanding of the instructions.   The patient was advised to call back or seek an in-person evaluation if the symptoms worsen or if the condition fails to improve as anticipated.  I provided 12 minutes of non-face-to-face time during this encounter.   Evelena Peat, MD

## 2020-04-12 ENCOUNTER — Other Ambulatory Visit: Payer: Self-pay

## 2020-04-12 ENCOUNTER — Telehealth (INDEPENDENT_AMBULATORY_CARE_PROVIDER_SITE_OTHER): Payer: BC Managed Care – PPO | Admitting: Psychiatry

## 2020-04-12 DIAGNOSIS — F060 Psychotic disorder with hallucinations due to known physiological condition: Secondary | ICD-10-CM | POA: Diagnosis not present

## 2020-04-12 NOTE — Progress Notes (Signed)
BH MD/PA/NP OP Progress Note  04/12/2020 9:39 AM Stephanie Cabrera  MRN:  [id number] Interview was conducted by phone and I verified that I was speaking with the correct person using two identifiers. I discussed the limitations of evaluation and management by telemedicine and  the availability of in person appointments. Patient expressed understanding and agreed to proceed. Participants in the visit: patient (location - home); physician (location - home office).  Chief Complaint: None.  HPI: 20yo AAF who was (7/19-22/21)hospitalized at [facility] forapsychotic break. She started to experience hallucinations about 2 months prior, felt confused, having memory problems and per IVC papers started to destroy furniture/TV in her room. She denies ever before having similar experiences (confirmed by her mother). AH appeared suddenly; she denies any new medications, any sudden stress, she does not abuse alcohol or drugs. The only recent potentially stressful event is giving birth to her firts child [name] (on 09/06/19). There is no psychiatric or substance abuse hx in her biological family. While in [facility] she was started on risperidone 1 mg in AM and 2 at bedtime. AHwere describedas a single female, unknown voice which at times would tell her to do things like get on bed etc.This was her first psychiatric admission. Chart review indicates that on May 27she had a seizure like episode, another one on 10/6.She stopped working Facilities manager job). Head CT done on 09/23/19 was normalbut EEG done by Dr. Karel Jarvis was not.MRI results are suggestive of MS. We tried to gradually lower dose of risperidone but when it was decrease to 2 mg she started to experience AH again. Around the same time she had her second seizure episode. Risperidone was increased back to 3 mg and when AH have not returned for more then a month we started to taper Risperdal down . She has been now on 1 mg for two weeks w/o any perceptual disturbances. She  remains on oxcarbazepine for seizures.   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  Past Medical History:  Past Medical History:  Diagnosis Date  . Allergy   . Medical history non-contributory   . Seizures (HCC)     Past Surgical History:  Procedure Laterality Date  . NO PAST SURGERIES      Family Psychiatric History: None.  Family History:  Family History  Problem Relation Age of Onset  . Diabetes Paternal Grandmother   . Diabetes Paternal Grandfather   . Hypertension Paternal Grandfather     Social History:  Social History   Socioeconomic History  . Marital status: Single    Spouse name: Not on file  . Number of children: 1  . Years of education: Not on file  . Highest education level: Not on file  Occupational History  . Not on file  Tobacco Use  . Smoking status: Never Smoker  . Smokeless tobacco: Never Used  Vaping Use  . Vaping Use: Never used  Substance and Sexual Activity  . Alcohol use: Never    Alcohol/week: 0.0 standard drinks  . Drug use: Never  . Sexual activity: Not Currently    Birth control/protection: None  Other Topics Concern  . Not on file  Social History Narrative   Right Handed   One Story    Lives with mom and daughter   No Caffeine   Social Determinants of Health   Financial Resource Strain: Not on  file  Food Insecurity: Not on file  Transportation Needs: Not on file  Physical Activity: Not on file  Stress: Not on file  Social Connections: Not on file    Allergies: No Known Allergies  Metabolic Disorder Labs: Lab Results  Component Value Date   HGBA1C 5.3 09/25/2019   MPG 105.41 09/25/2019   No results found for: PROLACTIN Lab Results  Component Value Date   CHOL 183 09/25/2019   TRIG 60 09/25/2019   HDL 67 09/25/2019   CHOLHDL 2.7 09/25/2019   VLDL 12 09/25/2019   LDLCALC 104 (H)  09/25/2019   Lab Results  Component Value Date   TSH 1.195 09/25/2019   TSH 1.479 09/23/2019    Therapeutic Level Labs: No results found for: LITHIUM No results found for: VALPROATE No components found for:  CBMZ  Current Medications: Current Outpatient Medications  Medication Sig Dispense Refill  . Oxcarbazepine (TRILEPTAL) 300 MG tablet Take 1/2 tablet twice a day for 3 days, then increase to 1 tablet twice a day 60 tablet 5  . Vitamin D, Ergocalciferol, (DRISDOL) 1.25 MG (50000 UNIT) CAPS capsule Take 1 capsule (50,000 Units total) by mouth every 7 (seven) days. Take 1 capsule once a week for 8 weeks 8 capsule 0   No current facility-administered medications for this visit.      Psychiatric Specialty Exam: Review of Systems  All other systems reviewed and are negative.   not currently breastfeeding.There is no height or weight on file to calculate BMI.  General Appearance: NA  Eye Contact:  NA  Speech:  Clear and Coherent and Normal Rate  Volume:  Normal  Mood:  Euthymic  Affect:  NA  Thought Process:  Goal Directed and Linear  Orientation:  Full (Time, Place, and Person)  Thought Content: Logical   Suicidal Thoughts:  No  Homicidal Thoughts:  No  Memory:  Immediate;   Good Recent;   Good Remote;   Good  Judgement:  Good  Insight:  Good  Psychomotor Activity:  NA  Concentration:  Concentration: Good  Recall:  Good  Fund of Knowledge: Good  Language: Good  Akathisia:  Negative  Handed:  Right  AIMS (if indicated): not done  Assets:  Communication Skills Desire for Improvement Financial Resources/Insurance Housing Social Support Talents/Skills  ADL's:  Intact  Cognition: WNL  Sleep:  Good   Screenings: AIMS   Flowsheet Row Admission (Discharged) from 09/24/2019 in [facility] Total Score 0    AUDIT   Flowsheet Row Admission (Discharged) from 09/24/2019 in [facility]  Alcohol  Use Disorder Identification Test Final Score (AUDIT) 0    GAD-7   Flowsheet Row Office Visit from 09/15/2019 in [facility] [place]  Total GAD-7 Score 6    [place] ED from 11/07/2019 in [facility] Office Visit from 09/15/2019 in [place] [facility] [place]  PHQ-2 Total Score 0 2  PHQ-9 Total Score 0 8       Assessment and Plan: 19yo AAF who was (7/19-22/21)hospitalized at [facility] forapsychotic break. She started to experience hallucinations about 2 months prior, felt confused, having memory problems and per IVC papers started to destroy furniture/TV in her room. She denies ever before having similar experiences (confirmed by her mother). AH appeared suddenly; she denies any new medications, any sudden stress, she does not abuse alcohol or drugs. The only recent potentially stressful event is giving birth to  her firts child [name] (on 09/06/19). There is no psychiatric or substance abuse hx in her biological family. While in [facility] she was started on risperidone 1 mg in AM and 2 at bedtime. AHwere describedas a single female, unknown voice which at times would tell her to do things like get on bed etc.This was her first psychiatric admission. Chart review indicates that on May 27she had a seizure like episode, another one on 10/6.She stopped working Facilities manager job). Head CT done on 09/23/19 was normalbut EEG done by Dr. Karel Jarvis was not.MRI results are suggestive of MS. We tried to gradually lower dose of risperidone but when it was decrease to 2 mg she started to experience AH again. Around the same time she had her second seizure episode. Risperidone was increased back to 3 mg and when AH have not returned for more then a month we started to taper Risperdal down . She has been now on 1 mg for two weeks w/o any perceptual disturbances. She remains on oxcarbazepine for seizures.  QH:[id number] disorderdue to medical condition with hallucinations    Plan: We will stop risperidone at this time (she still have a week of 1 mg pills left and I asked her to keep those and use then should AH return in the future). Next appointment on as needed basis.The plan was discussed with patient who had an opportunity to ask questions and these were all answered. I spend15 minutes inphone consultation with the patient.   Magdalene Patricia, MD 04/12/2020, 9:39 AM

## 2020-05-08 ENCOUNTER — Telehealth: Payer: Self-pay | Admitting: Neurology

## 2020-05-08 NOTE — Telephone Encounter (Signed)
Patient's mother called in returning Christy's call

## 2020-05-09 NOTE — Telephone Encounter (Signed)
Orders sent to [facility]. They will arrange time for patient.

## 2020-05-09 NOTE — Telephone Encounter (Signed)
Sent message to call office, please if patient calls in. Order has been faxed to [facility].

## 2020-05-17 ENCOUNTER — Other Ambulatory Visit: Payer: Self-pay

## 2020-05-17 ENCOUNTER — Telehealth: Payer: Self-pay

## 2020-05-17 DIAGNOSIS — G35 Multiple sclerosis: Secondary | ICD-10-CM

## 2020-05-17 NOTE — Telephone Encounter (Signed)
Letter mailed to pt.  

## 2020-05-17 NOTE — Telephone Encounter (Signed)
[facility] called stated pt need the lab Hepatatis B surface antigen or either a letter of clearance from Dr Karel Jarvis stating that Stephanie Cabrera can continue getting her infusion with out the lab work at this time and get it at a later date.

## 2020-05-18 ENCOUNTER — Telehealth: Payer: Self-pay | Admitting: Neurology

## 2020-05-18 NOTE — Telephone Encounter (Signed)
Patient and mother not responding to phone calls from office, letter sent to have Hepatitis surface Ag bloodwork done and to call back [facility] to set up infusions.

## 2020-05-18 NOTE — Telephone Encounter (Signed)
Pt mother called back mail box is full

## 2020-05-19 ENCOUNTER — Other Ambulatory Visit: Payer: Self-pay

## 2020-05-19 DIAGNOSIS — G35 Multiple sclerosis: Secondary | ICD-10-CM

## 2020-05-19 NOTE — Telephone Encounter (Signed)
Spoke with pt mother they are going to come in on wed to have lab work done,

## 2020-05-24 ENCOUNTER — Other Ambulatory Visit: Payer: BC Managed Care – PPO

## 2020-05-24 ENCOUNTER — Encounter: Payer: Self-pay | Admitting: Family Medicine

## 2020-05-24 ENCOUNTER — Encounter: Payer: BC Managed Care – PPO | Admitting: Family Medicine

## 2020-05-24 ENCOUNTER — Other Ambulatory Visit: Payer: Self-pay

## 2020-05-24 DIAGNOSIS — G35 Multiple sclerosis: Secondary | ICD-10-CM | POA: Diagnosis not present

## 2020-05-25 ENCOUNTER — Telehealth: Payer: Self-pay

## 2020-05-25 LAB — HEPATITIS B SURFACE ANTIGEN: Hepatitis B Surface Ag: NONREACTIVE

## 2020-05-25 NOTE — Telephone Encounter (Signed)
-----   Message from Van Clines, MD sent at 05/25/2020 12:05 PM EST ----- Pls let Dilia/mom know the bloodwork was normal, proceed with Ocrevus, pls have them contact rep so they can get started, thanks

## 2020-05-25 NOTE — Telephone Encounter (Signed)
Spoke with pt mom informed bloodwork was normal lab work results  faxed to [employer]

## 2020-06-07 ENCOUNTER — Other Ambulatory Visit: Payer: Self-pay

## 2020-06-07 ENCOUNTER — Encounter: Payer: Self-pay | Admitting: Neurology

## 2020-06-07 ENCOUNTER — Telehealth (INDEPENDENT_AMBULATORY_CARE_PROVIDER_SITE_OTHER): Payer: BC Managed Care – PPO | Admitting: Neurology

## 2020-06-07 VITALS — Ht 67.0 in | Wt 190.0 lb

## 2020-06-07 DIAGNOSIS — G35 Multiple sclerosis: Secondary | ICD-10-CM

## 2020-06-07 DIAGNOSIS — E559 Vitamin D deficiency, unspecified: Secondary | ICD-10-CM

## 2020-06-07 DIAGNOSIS — G40009 Localization-related (focal) (partial) idiopathic epilepsy and epileptic syndromes with seizures of localized onset, not intractable, without status epilepticus: Secondary | ICD-10-CM

## 2020-06-07 MED ORDER — OXCARBAZEPINE 300 MG PO TABS
ORAL_TABLET | ORAL | 11 refills | Status: DC
Start: 2020-06-07 — End: 2020-11-10

## 2020-06-07 NOTE — Patient Instructions (Signed)
1. Restart oxcarbazepine 300mg  twice a day  2. Check vitamin D level  3. Proceed with Ocrevus infusions  4. Follow-up in 4-5 months, call for any changes   Seizure Precautions: 1. If medication has been prescribed for you to prevent seizures, take it exactly as directed.  Do not stop taking the medicine without talking to your doctor first, even if you have not had a seizure in a long time.   2. Avoid activities in which a seizure would cause danger to yourself or to others.  Don't operate dangerous machinery, swim alone, or climb in high or dangerous places, such as on ladders, roofs, or girders.  Do not drive unless your doctor says you may.  3. If you have any warning that you may have a seizure, lay down in a safe place where you can't hurt yourself.    4.  No driving for 6 months from last seizure, as per [facility].   Please refer to the following link on the Epilepsy Foundation of America's website for more information: http://www.epilepsyfoundation.org/answerplace/Social/driving/drivingu.cfm   5.  Maintain good sleep hygiene. Avoid alcohol.  6.  Notify your neurology if you are planning pregnancy or if you become pregnant.  7.  Contact your doctor if you have any problems that may be related to the medicine you are taking.  8.  Call 911 and bring the patient back to the ED if:        A.  The seizure lasts longer than 5 minutes.       B.  The patient doesn't awaken shortly after the seizure  C.  The patient has new problems such as difficulty seeing, speaking or moving  D.  The patient was injured during the seizure  E.  The patient has a temperature over 102 F (39C)  F.  The patient vomited and now is having trouble breathing

## 2020-06-07 NOTE — Progress Notes (Signed)
Virtual Visit via Video Note The purpose of this virtual visit is to provide medical care while limiting exposure to the novel coronavirus.    Consent was obtained for video visit:  Yes.   Answered questions that patient had about telehealth interaction:  Yes.   I discussed the limitations, risks, security and privacy concerns of performing an evaluation and management service by telemedicine. I also discussed with the patient that there may be a patient responsible charge related to this service. The patient expressed understanding and agreed to proceed.  Pt location: Private vehicle Physician Location: office Name of referring provider:  [place], Betty G, MD I connected with Stephanie Cabrera at patients initiation/request on 06/07/2020 at  3:00 PM EST by video enabled telemedicine application and verified that I am speaking with the correct person using two identifiers. Pt MRN:  [id number] Pt DOB:  13-Jan-2001 Video Participants:  Stephanie Cabrera;  Daniela Siebers (mother)   History of Present Illness:  The patient had a virtual video visit on 06/07/2020. She was last seen 4 months ago for seizure and MS. Her mother is present to provide additional information. Since her last visit, she and her mother deny any seizures since 01/12/2020. She denies any further hallucinations as well, and after consultation with her psychiatrist, weaned off Risperdal. Since she was not having seizures, she also stopped the oxcarbazepine. She denies any headaches, dizziness, focal weakness, no falls. She has tingling in her right hand. Sleep and mood are good, she is feeling great and in good spirits.    History on Initial Assessment 11/02/2019: This is a pleasant 20 year old right-handed woman with a history of anxiety presenting for evaluation of seizure-like activity that occurred on 09/02/2019. She was in her usual state of health until 09/02/19 when she recalls sitting on the commode then waking up in  the ambulance. Her mother reports that she was tired and took 4 tablets of melatonin 3mg  which her mother thought was too much. She increased her water intake and was in the bathroom when she called her mother saying her right hand was going numb, then she started losing consciousness and fell off the toilet. Her mother laid her on the ground and saw her whole body stiffening and shaking for 3 minutes. She bit her tongue. When EMS arrived, she could say her name and follow instructions, no focal weakness. She left the ER AMA. She saw her PCP on 09/15/19 and reported increassed stress worse after her baby was born in 08/2018. A week later, she was brought to the ER on 6/17 for IVC because she started destroying her TV and lamp in her bedroom. Family walked into the room and she was naked on her dresser with items around her. Family calmed her down and she indicated no memory of the event. According to IVC paperwork, this has happened 2 times previously, she had told them she has been speaking to God. She had not been sleeping well. Head CT no acute changes. 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 Per notes, she had been having auditory hallucinations for several weeks to months, however her mother states she started hallucinating after the seizure in May. She went back to [facility] on 7/8 due to recurrence of auditory hallucinations. Dose increased to 4mg  qhs yesterday. She denies any further hallucinations. She states her mood is good, she denies any depression or anxiety. Her mother reports  that when the seizure occurred, her anxiety was bad. 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 She is now sleeping better.   She has had constant numbness in her hands since she gave birth a year ago, R>L. She denies any headaches, dizziness, diplopia, neck/back pain, bowel/bladder dysfunction. She  has occasional episodes of a metallic taste in her mouth for a few minutes that occur a couple of times a month. She has occasional jerks in her back. Her mother denies any staring/unresponsive episodes. She lives with her mother and 22 year old daughter [name]. Her mother feels she is back to baseline and is mostly worried about her memory. Since the seizure, she cannot remember where she puts things. Her mother administers her medication. She has not been driving. She denies any alcohol or illicit drug use.   Epilepsy Risk Factors:  Her paternal grandfather had seizures. Otherwise she had a normal birth and early development.  There is no history of febrile convulsions, CNS infections such as meningitis/encephalitis, significant traumatic brain injury, neurosurgical procedures.  Update 02/04/20: She had another seizure that occurred in her sleep at 3am last 01/12/20. Her mother heard her yell out and found her having a GTC that lasted less than a minute. She was confused after, no focal weakness. She was started on oxcarbazepine 300mg  BID. She has since also finished a 3-day course of IV Solumedrol. She presents to discuss MRI cervical and thoracic spine with and without contrast done 02/02/20. The cervical cord appears mildly expanded with diffuse heterogeneous T2 signal from C1 through C5, no abnormal enhancement. There is minimal heterogeneity in the proximal thoracic cord with inferior extension to the T3-4 disc space level, no abnormal enhancement.   Routine and 24-hour EEG in 11/2019 which were abnormal due to focal slowing over the bilateral temporal regions, right greater than left. There was also note of frontal intermittent rhythmic delta activity (FIRDA). No epileptiform discharges seen, typical events not captured.  MRI brain with and without contrast in 01/2020 showed numerous white matter lesions, many are periventricular, some are subcortical and deep white matter. There is a 10x72mm lesion in  the left brachium pontis with additional smaller lesions in the brachium pontis bilaterally. There was some restricted diffusion in the left mid-frontal lobe, multiple enhancing lesions in the anterior frontal lobes bilaterally, right middle frontal lobe, right parietal periventricular white matter. Findings felt compatible with multiple sclerosis. MRI cervical and thoracic spine with and without contrast done 02/02/20. The cervical cord appears mildly expanded with diffuse heterogeneous T2 signal from C1 through C5, no abnormal enhancement. There is minimal heterogeneity in the proximal thoracic cord with inferior extension to the T3-4 disc space level, no abnormal enhancement.     Current Outpatient Medications on File Prior to Visit  Medication Sig Dispense Refill  . Vitamin D, Ergocalciferol, (DRISDOL) 1.25 MG (50000 UNIT) CAPS capsule Take 1 capsule (50,000 Units total) by mouth every 7 (seven) days. Take 1 capsule once a week for 8 weeks 8 capsule 0   No current facility-administered medications on file prior to visit.     Observations/Objective:   Vitals:   06/07/20 1512  Weight: 190 lb (86.2 kg)  Height: 5\' 7"  (1.702 m)   GEN:  The patient appears stated age and is in NAD.  Neurological examination: Patient is awake, alert, in good spirits. No aphasia or dysarthria. Intact fluency and comprehension. Cranial nerves: Extraocular movements  intact. No facial asymmetry. Motor: moves all extremities symmetrically, at least anti-gravity x 4.  Assessment and Plan:   This is a pleasant 20 yo RH woman with a history of anxiety, new onset seizures, and newly diagnosed MS. She had new onset auditory hallucinations diagnosed with a brief psychotic episode last 09/23/2019 when she was found confused. Curiously, her mother witnessed an episode concerning for seizure 3 weeks prior. Her neurological exam is normal. Her EEG had shown bilateral temporal focal slowing, right greater than left, no  epileptiform discharges. She had a nocturnal seizure last 01/12/2020 and was started on oxcarbazepine 300mg  BID, she had stopped it because she has not had any seizures since 01/2020, discussed the need to continue medication. She denies any further auditory hallucinations and has weaned off Risperdal. She is awaiting benefits approval to proceed with Ocrevus infusions for treatment of MS. She is in good spirits and denies any significant symptoms. Re-check vitamin D level. She is aware of [place] driving laws to stop driving until 6 months seizure-free. Follow-up in 4-5 months,she knows to call for any changes.    Follow Up Instructions:   -I discussed the assessment and treatment plan with the patient. The patient was provided an opportunity to ask questions and all were answered. The patient agreed with the plan and demonstrated an understanding of the instructions.   The patient was advised to call back or seek an in-person evaluation if the symptoms worsen or if the condition fails to improve as anticipated.    02/2020, MD

## 2020-06-08 ENCOUNTER — Telehealth: Payer: Self-pay | Admitting: Neurology

## 2020-06-08 ENCOUNTER — Encounter: Payer: Self-pay | Admitting: Neurology

## 2020-06-08 NOTE — Telephone Encounter (Signed)
Patient's mom has questions about the next step since patient's Ocrevus was denied.

## 2020-06-08 NOTE — Progress Notes (Addendum)
Received approval for the 300 mg Ocrevus valid from 06/06/20 to 06/05/21. Ref#: [id number]. For [facility] infusion services. 1200 mg total. Scanned into patient's chart.

## 2020-06-08 NOTE — Telephone Encounter (Signed)
Patient's mom called and left a message requesting a call back from [facility] about this patient.

## 2020-06-08 NOTE — Telephone Encounter (Signed)
LMOM for mom that Ocrevus was approved. Received approval letter from the insurance this morning that she approved for a full year. Instructed her to call back if any other questions.

## 2020-06-22 ENCOUNTER — Telehealth: Payer: Self-pay | Admitting: Neurology

## 2020-06-22 NOTE — Telephone Encounter (Signed)
Patient called to follow up on Ocrevus denial.

## 2020-06-22 NOTE — Telephone Encounter (Signed)
[facility] is telling them they are denied because they have 2 insurances. And they will not get her set up with an appointment , she told them they have a approval letter and they told her it didn't matter she was still denied,

## 2020-06-22 NOTE — Telephone Encounter (Signed)
Wait are they saying they wont see her because she has two insurances?  Or are they needing a PA done for both insurances?

## 2020-06-22 NOTE — Telephone Encounter (Signed)
Per my last documentation her ocrevus has been approved.  "Received approval for the 300 mg Ocrevus valid from 06/06/20 to 06/05/21. Ref#: [id number]  For [facility]. 1200 mg total. Received approval for the 300 mg Ocrevus valid from 06/06/20 to 06/05/21. Ref#: [id number]  For [facility]. 1200 mg total. "  I have already sent over the approval letter to [facility]. And spoke with her mother a week ago and informed her of this as well. Not sure why she thinks there is a denial. Might want to make sure [facility] has the orders and everything else they need for ocrevus because the PA is good.

## 2020-06-23 NOTE — Telephone Encounter (Signed)
Heather, pls see Chelsea's note, let's try to send order to [facility] and see if they can do it, thanks

## 2020-06-23 NOTE — Telephone Encounter (Signed)
I spoke with patient's mother. She said that they are denying because [facility] doesn't except patient's with double coverage insurance. They will not be able to complete her infusions because she has 2 insurances.   Can we look to see if we can send the orders to the new [facility] infusion center? I believe its over at [facility] for her. To see if they can except her insurance. I already called the mom to let her know we were working on finding another facility that excepts her ins.   Thanks!

## 2020-06-26 ENCOUNTER — Other Ambulatory Visit: Payer: Self-pay

## 2020-06-26 DIAGNOSIS — G35 Multiple sclerosis: Secondary | ICD-10-CM

## 2020-06-27 NOTE — Telephone Encounter (Signed)
I have submitted a new PA for the new location for ocrevus infusion. Awaiting determination from insurance.

## 2020-06-28 ENCOUNTER — Other Ambulatory Visit: Payer: Self-pay

## 2020-06-28 NOTE — Progress Notes (Signed)
Lechelle Dimmick Key: [id number] help? Call us at [phone] Outcome Approvedon March 22 Effective from 06/27/2020 through 09/25/2020. Drug Ocrevus 300MG solution Form Janine Ores [employer] [employer] Benefit Electronic Request Form (CB)  For the new [facility] at [facility]- [facility] wouldn't except patient's double coverage insurance.

## 2020-06-28 NOTE — Telephone Encounter (Signed)
Received approval for the new [facility]- all the PA info is updated in her chart.

## 2020-06-29 ENCOUNTER — Telehealth: Payer: Self-pay | Admitting: Pharmacy Technician

## 2020-06-29 NOTE — Telephone Encounter (Signed)
RE: Emogene Morgan prior authorization.  Patient does not meet medical necessity criteria to receive infusion at [facility] I have updated PA request with [employer] for [facility] @ [address] as the site for treatment. PA is under review and approval decision will be sent to Dr. Rosalyn Gess office and Infusion Center.   Investigating Medicaid benefits to determine patient out of pocket expense.  Desma Mcgregor

## 2020-07-03 ENCOUNTER — Other Ambulatory Visit: Payer: Self-pay | Admitting: Pharmacy Technician

## 2020-07-03 ENCOUNTER — Telehealth: Payer: Self-pay | Admitting: Pharmacy Technician

## 2020-07-03 DIAGNOSIS — G35 Multiple sclerosis: Secondary | ICD-10-CM | POA: Insufficient documentation

## 2020-07-03 NOTE — Telephone Encounter (Signed)
Received [employer] approval for Ocrevus infusions at [facility] at [facility].  Ref # [id number] Authorized from 06/27/20-06/26/21  [facility] [place] Medicaid Plan does not require a PA.  Ref# [id number]  Patient will be schedule for Ocrevus infusion at [facility] [address] in the next week.

## 2020-07-03 NOTE — Telephone Encounter (Signed)
Good news, thanks.

## 2020-07-03 NOTE — Telephone Encounter (Signed)
Received notification from [facility] regarding a prior authorization for OCREVUS. Authorization has been APPROVED from 06/27/20 to 06/26/21.   Authorization # [id number] [employer]

## 2020-07-24 ENCOUNTER — Ambulatory Visit (INDEPENDENT_AMBULATORY_CARE_PROVIDER_SITE_OTHER): Payer: BC Managed Care – PPO

## 2020-07-24 ENCOUNTER — Other Ambulatory Visit: Payer: Self-pay

## 2020-07-24 VITALS — BP 115/72 | HR 79 | Temp 98.1°F | Resp 18 | Ht 67.0 in | Wt 212.1 lb

## 2020-07-24 DIAGNOSIS — G35 Multiple sclerosis: Secondary | ICD-10-CM

## 2020-07-24 MED ORDER — DIPHENHYDRAMINE HCL 25 MG PO CAPS
50.0000 mg | ORAL_CAPSULE | Freq: Once | ORAL | Status: AC
Start: 2020-07-24 — End: 2020-07-24
  Administered 2020-07-24: 50 mg via ORAL
  Filled 2020-07-24: qty 2

## 2020-07-24 MED ORDER — ACETAMINOPHEN 325 MG PO TABS
650.0000 mg | ORAL_TABLET | Freq: Once | ORAL | Status: AC
Start: 2020-07-24 — End: 2020-07-24
  Administered 2020-07-24: 650 mg via ORAL
  Filled 2020-07-24: qty 2

## 2020-07-24 MED ORDER — SODIUM CHLORIDE 0.9 % IV SOLN
300.0000 mg | Freq: Once | INTRAVENOUS | Status: AC
  Administered 2020-07-24: 300 mg via INTRAVENOUS
  Filled 2020-07-24: qty 10

## 2020-07-24 MED ORDER — METHYLPREDNISOLONE SODIUM SUCC 125 MG IJ SOLR
125.0000 mg | Freq: Once | INTRAMUSCULAR | Status: AC
  Administered 2020-07-24: 125 mg via INTRAVENOUS
  Filled 2020-07-24: qty 2

## 2020-07-24 NOTE — Progress Notes (Signed)
Diagnosis: MS  Provider:  Chilton Greathouse, MD  Procedure: Infusion  IV Type: Peripheral, IV Location: L Hand  Ocrevus (Ocrelizumab), Dose: 300 mg  Infusion Start Time: 1000  Infusion Stop Time: 1310  Post Infusion IV Care: Observation period completed and Peripheral IV Discontinued  Discharge: Condition: Good, Destination: Home . AVS provided to patient.   Performed by:  Nat Math, RN

## 2020-08-07 ENCOUNTER — Ambulatory Visit (INDEPENDENT_AMBULATORY_CARE_PROVIDER_SITE_OTHER): Payer: BC Managed Care – PPO

## 2020-08-07 ENCOUNTER — Other Ambulatory Visit: Payer: Self-pay

## 2020-08-07 VITALS — BP 127/75 | HR 77 | Temp 98.3°F | Resp 18

## 2020-08-07 DIAGNOSIS — G35 Multiple sclerosis: Secondary | ICD-10-CM

## 2020-08-07 MED ORDER — OCRELIZUMAB 300 MG/10ML IV SOLN
300.0000 mg | Freq: Once | INTRAVENOUS | Status: AC
  Administered 2020-08-07: 300 mg via INTRAVENOUS
  Filled 2020-08-07: qty 10

## 2020-08-07 MED ORDER — METHYLPREDNISOLONE SODIUM SUCC 125 MG IJ SOLR
125.0000 mg | Freq: Once | INTRAMUSCULAR | Status: AC
  Administered 2020-08-07: 125 mg via INTRAVENOUS
  Filled 2020-08-07: qty 2

## 2020-08-07 MED ORDER — ACETAMINOPHEN 325 MG PO TABS
650.0000 mg | ORAL_TABLET | Freq: Once | ORAL | Status: AC
  Administered 2020-08-07: 650 mg via ORAL
  Filled 2020-08-07: qty 2

## 2020-08-07 MED ORDER — DIPHENHYDRAMINE HCL 25 MG PO CAPS
50.0000 mg | ORAL_CAPSULE | Freq: Once | ORAL | Status: AC
  Administered 2020-08-07: 50 mg via ORAL
  Filled 2020-08-07: qty 2

## 2020-08-07 NOTE — Progress Notes (Signed)
Diagnosis: Multiple Sclerosis  Provider:  Chilton Greathouse, MD  Procedure: Infusion  IV Type: Peripheral, IV Location: L Hand  Ocrevus , Dose: 300 mg  Infusion Start Time: 1012  Infusion Stop Time: 1310  Post Infusion IV Care: Observation period completed and Peripheral IV Discontinued  Discharge: Condition: Good, Destination: Home . AVS provided to patient.   Performed by:  Garnette Czech, RN

## 2020-08-07 NOTE — Patient Instructions (Addendum)
Excuse from Work, [employer], or Physical Activity _______________________________________________________ needs to be excused from: ____ Work. ____ School. ____ Physical activity. This is effective for the following dates: ______________________________________. He or she may return to work or school, but should avoid physical activity or other activities from now until _______________. Activity restrictions include: ____ Lifting more than _________ lb. ____ Sitting longer than __________ minutes at a time. ____ Standing longer than ________ minutes at a time. ____ Other activities including: ___________________________________________________________________________________ ____ He or she may return to full physical activity on ________________. Health care provider name (printed): _____________________________________________________ Health care provider (signature): ______________________________________________________ Date: ______________________________ This information is not intended to replace advice given to you by your health care provider. Make sure you discuss any questions you have with your health care provider. Document Revised: 03/20/2017 Document Reviewed: 03/20/2017 Elsevier Patient Education  2021 [employer].

## 2020-08-10 DIAGNOSIS — F2081 Schizophreniform disorder: Secondary | ICD-10-CM | POA: Diagnosis not present

## 2020-08-26 DIAGNOSIS — F2081 Schizophreniform disorder: Secondary | ICD-10-CM | POA: Diagnosis not present

## 2020-09-22 ENCOUNTER — Other Ambulatory Visit: Payer: Self-pay

## 2020-09-22 NOTE — Telephone Encounter (Signed)
Correct, not sure why they are asking for refills. Will need to re-check her vitamin D level first and see where she is at. Pls order vitamin D level and let her know which lab to go to, thanks

## 2020-09-26 NOTE — Telephone Encounter (Signed)
Hi Stephanie Cabrera, just closing the loop. Can you pls let patient know to repeat vitamin D level, pls order and send to whichever lab she prefers. Thanks!

## 2020-10-26 DIAGNOSIS — F2081 Schizophreniform disorder: Secondary | ICD-10-CM | POA: Diagnosis not present

## 2020-11-01 ENCOUNTER — Telehealth: Payer: Self-pay | Admitting: Pharmacy Technician

## 2020-11-01 NOTE — Telephone Encounter (Signed)
Will start the process of enrolling patient in [employer] for OCREVUS. Left v/m with patient. Awaiting call back.

## 2020-11-08 DIAGNOSIS — F2081 Schizophreniform disorder: Secondary | ICD-10-CM | POA: Diagnosis not present

## 2020-11-10 ENCOUNTER — Other Ambulatory Visit: Payer: Self-pay

## 2020-11-10 ENCOUNTER — Ambulatory Visit (INDEPENDENT_AMBULATORY_CARE_PROVIDER_SITE_OTHER): Payer: BC Managed Care – PPO | Admitting: Neurology

## 2020-11-10 ENCOUNTER — Encounter: Payer: Self-pay | Admitting: Neurology

## 2020-11-10 VITALS — BP 121/79 | HR 74 | Ht 67.0 in | Wt 202.8 lb

## 2020-11-10 DIAGNOSIS — R413 Other amnesia: Secondary | ICD-10-CM | POA: Diagnosis not present

## 2020-11-10 DIAGNOSIS — G40009 Localization-related (focal) (partial) idiopathic epilepsy and epileptic syndromes with seizures of localized onset, not intractable, without status epilepticus: Secondary | ICD-10-CM | POA: Diagnosis not present

## 2020-11-10 DIAGNOSIS — G35 Multiple sclerosis: Secondary | ICD-10-CM

## 2020-11-10 DIAGNOSIS — E559 Vitamin D deficiency, unspecified: Secondary | ICD-10-CM

## 2020-11-10 MED ORDER — OXCARBAZEPINE 300 MG PO TABS: ORAL_TABLET | ORAL | 11 refills | Status: DC

## 2020-11-10 NOTE — Progress Notes (Signed)
NEUROLOGY FOLLOW UP OFFICE NOTE  Stephanie Cabrera [id number] 2000-11-09  HISTORY OF PRESENT ILLNESS: I had the pleasure of seeing Stephanie Cabrera in follow-up in the neurology clinic on 11/10/2020.  The patient was last seen 5 months ago for seizures and MS. She is alone in the office today. Records and images were personally reviewed where available.  Since her last visit, she has received the first 2 doses of Ocrevus with no side effects. Next infusion will be in November 2022. She still has occasional right hand tingling, no change from before. She denies any headaches, dizziness, vision loss, focal weakness, bowel/bladder dysfunction. Sleep is good. She denies any seizures since 22/2021. No further auditory hallucinations off Risperdal for over 6 months. She reports mood has mellowed out on oxcarbazepine, she has been only taking 1 tablet daily without side effects. She denies any staring/unresponsive episodes, confusion. She has noticed memory changes, she would not remember prior conversations. She has been driving without getting lost. She denies missing medication aside from taking oxcarbazepine once instead of twice a day, no missed bills.She lives with her mother and 62 year old daughter.   History on Initial Assessment 11/02/2019: This is a pleasant 20 year old right-handed woman with a history of anxiety presenting for evaluation of seizure-like activity that occurred on 09/02/2019. She was in her usual state of health until 09/02/19 when she recalls sitting on the commode then waking up in the ambulance. Her mother reports that she was tired and took 4 tablets of melatonin 3mg  which her mother thought was too much. She increased her water intake and was in the bathroom when she called her mother saying her right hand was going numb, then she started losing consciousness and fell off the toilet. Her mother laid her on the ground and saw her whole body stiffening and shaking for 3 minutes.  She bit her tongue. When EMS arrived, she could say her name and follow instructions, no focal weakness. She left the ER AMA. She saw her PCP on 09/15/19 and reported increassed stress worse after her baby was born in 08/2018. A week later, she was brought to the ER on 6/17 for IVC because she started destroying her TV and lamp in her bedroom. Family walked into the room and she was naked on her dresser with items around her. Family calmed her down and she indicated no memory of the event. According to IVC paperwork, this has happened 2 times previously, she had told them she has been speaking to God. She had not been sleeping well. Head CT no acute changes. 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 Per notes, she had been having auditory hallucinations for several weeks to months, however her mother states she started hallucinating after the seizure in May. She went back to [facility] on 7/8 due to recurrence of auditory hallucinations. Dose increased to 4mg  qhs yesterday. She denies any further hallucinations. She states her mood is good, she denies any depression or anxiety. Her mother reports that when the seizure occurred, her anxiety was bad. 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 She is now sleeping better.   She has had constant numbness in her hands since she gave birth a year ago, R>L. She denies any headaches, dizziness, diplopia, neck/back pain, bowel/bladder dysfunction. She has occasional episodes of a metallic taste  in her mouth for a few minutes that occur a couple of times a month. She has occasional jerks in her back. Her mother denies any staring/unresponsive episodes. She lives with her mother and 87 year old daughter Stephanie Cabrera. Her mother feels she is back to baseline and is mostly worried about her memory. Since the seizure, she cannot remember where she puts things. Her  mother administers her medication. She has not been driving. She denies any alcohol or illicit drug use.   Epilepsy Risk Factors:  Her paternal grandfather had seizures. Otherwise she had a normal birth and early development.  There is no history of febrile convulsions, CNS infections such as meningitis/encephalitis, significant traumatic brain injury, neurosurgical procedures.  Update 02/04/20: She had another seizure that occurred in her sleep at 3am last 01/12/20. Her mother heard her yell out and found her having a GTC that lasted less than a minute. She was confused after, no focal weakness. She was started on oxcarbazepine 300mg  BID. She has since also finished a 3-day course of IV Solumedrol. She presents to discuss MRI cervical and thoracic spine with and without contrast done 02/02/20. The cervical cord appears mildly expanded with diffuse heterogeneous T2 signal from C1 through C5, no abnormal enhancement. There is minimal heterogeneity in the proximal thoracic cord with inferior extension to the T3-4 disc space level, no abnormal enhancement.   Routine and 24-hour EEG in 11/2019 which were abnormal due to focal slowing over the bilateral temporal regions, right greater than left. There was also note of frontal intermittent rhythmic delta activity (FIRDA). No epileptiform discharges seen, typical events not captured.  MRI brain with and without contrast in 01/2020 showed numerous white matter lesions, many are periventricular, some are subcortical and deep white matter. There is a 10x56mm lesion in the left brachium pontis with additional smaller lesions in the brachium pontis bilaterally. There was some restricted diffusion in the left mid-frontal lobe, multiple enhancing lesions in the anterior frontal lobes bilaterally, right middle frontal lobe, right parietal periventricular white matter. Findings felt compatible with multiple sclerosis. MRI cervical and thoracic spine with and without contrast  done 02/02/20. The cervical cord appears mildly expanded with diffuse heterogeneous T2 signal from C1 through C5, no abnormal enhancement. There is minimal heterogeneity in the proximal thoracic cord with inferior extension to the T3-4 disc space level, no abnormal enhancement.    PAST MEDICAL HISTORY: Past Medical History:  Diagnosis Date   Allergy    Medical history non-contributory    Seizures (HCC)     MEDICATIONS: Current Outpatient Medications on File Prior to Visit  Medication Sig Dispense Refill   Oxcarbazepine (TRILEPTAL) 300 MG tablet Take 1 tablet twice a day 60 tablet 11   Vitamin D, Ergocalciferol, (DRISDOL) 1.25 MG (50000 UNIT) CAPS capsule Take 1 capsule (50,000 Units total) by mouth every 7 (seven) days. Take 1 capsule once a week for 8 weeks 8 capsule 0   No current facility-administered medications on file prior to visit.    ALLERGIES: No Known Allergies  FAMILY HISTORY: Family History  Problem Relation Age of Onset   Diabetes Paternal Grandmother    Diabetes Paternal Grandfather    Hypertension Paternal Grandfather     SOCIAL HISTORY: Social History   Socioeconomic History   Marital status: Single    Spouse name: Not on file   Number of children: 1   Years of education: Not on file   Highest education level: Not on file  Occupational History  Not on file  Tobacco Use   Smoking status: Never   Smokeless tobacco: Never  Vaping Use   Vaping Use: Never used  Substance and Sexual Activity   Alcohol use: Never    Alcohol/week: 0.0 standard drinks   Drug use: Never   Sexual activity: Not Currently    Birth control/protection: None  Other Topics Concern   Not on file  Social History Narrative   Right Handed   One Story    Lives with mom and daughter   No Caffeine   Social Determinants of Health   Financial Resource Strain: Not on file  Food Insecurity: Not on file  Transportation Needs: Not on file  Physical Activity: Not on file  Stress:  Not on file  Social Connections: Not on file  Intimate Partner Violence: Not on file     PHYSICAL EXAM: Vitals:   11/10/20 1400  BP: 121/79  Pulse: 74  SpO2: 98%   General: No acute distress Head:  Normocephalic/atraumatic Skin/Extremities: No rash, no edema Neurological Exam: alert and oriented to person, place, and time. No aphasia or dysarthria. Fund of knowledge is appropriate.  Recent and remote memory are intact, 3/3 delayed recall.  Attention and concentration are normal.   Cranial nerves: Pupils equal, round. Extraocular movements intact with no nystagmus. Visual fields full.  No facial asymmetry.  Motor: Bulk and tone normal, muscle strength 5/5 throughout with no pronator drift. Sensation intact to light touch, cold, pin, vibration sense. Reflexes +1 both UE, +2 both LE.  Finger to nose testing intact.  Gait narrow-based and steady, able to tandem walk adequately.  Romberg negative.   IMPRESSION: This is a pleasant 20 yo RH woman with a history of anxiety, new onset seizures, and newly diagnosed MS in October 2021. She denies any further auditory hallucinations in over 6 months, off Risperdal. No seizures since 01/2020, she had been taking oxcarbazepine 300mg  only once a day, continue on this dose unless symptoms change. It also helps with mood. She has started Ocrevus infusions for MS, next infusion in November 2022. Bloodwork for CBC with diff will be ordered. She also reports memory changes, check vitamin D, vitamin B12, TSH. She is aware of [place] driving laws to stop driving after a seizure until 6 months seizure-free. Follow-up in 4-5 months, call for any changes.   Thank you for allowing me to participate in her care.  Please do not hesitate to call for any questions or concerns.    December 2022, M.D.   CC: Dr. Patrcia Dolly

## 2020-11-10 NOTE — Patient Instructions (Addendum)
Always good to see you!  Bloodwork for CBC with diff, TSH, vitamin D, vitamin B12  2. Continue oxcarbazepine 300mg  daily  3. If you have not called her back, please contact about the Amalia Greenhouse  4. Follow-up in 4-5 months, call for any changes   Seizure Precautions: 1. If medication has been prescribed for you to prevent seizures, take it exactly as directed.  Do not stop taking the medicine without talking to your doctor first, even if you have not had a seizure in a long time.   2. Avoid activities in which a seizure would cause danger to yourself or to others.  Don't operate dangerous machinery, swim alone, or climb in high or dangerous places, such as on ladders, roofs, or girders.  Do not drive unless your doctor says you may.  3. If you have any warning that you may have a seizure, lay down in a safe place where you can't hurt yourself.    4.  No driving for 6 months from last seizure, as per [facility]   Please refer to the following link on the Epilepsy Foundation of America's website for more information: http://www.epilepsyfoundation.org/answerplace/Social/driving/drivingu.cfm   5.  Maintain good sleep hygiene. Avoid alcohol  6.  Notify your neurology if you are planning pregnancy or if you become pregnant.  7.  Contact your doctor if you have any problems that may be related to the medicine you are taking.  8.  Call 911 and bring the patient back to the ED if:        A.  The seizure lasts longer than 5 minutes.       B.  The patient doesn't awaken shortly after the seizure  C.  The patient has new problems such as difficulty seeing, speaking or moving  D.  The patient was injured during the seizure  E.  The patient has a temperature over 102 F (39C)  F.  The patient vomited and now is having trouble breathing

## 2020-11-14 NOTE — Telephone Encounter (Signed)
Enrolled patient in [employer] for [employer].  Awaiting determination.

## 2020-11-17 ENCOUNTER — Other Ambulatory Visit: Payer: Self-pay

## 2020-11-17 ENCOUNTER — Other Ambulatory Visit (INDEPENDENT_AMBULATORY_CARE_PROVIDER_SITE_OTHER): Payer: BC Managed Care – PPO

## 2020-11-17 DIAGNOSIS — R413 Other amnesia: Secondary | ICD-10-CM

## 2020-11-17 DIAGNOSIS — E559 Vitamin D deficiency, unspecified: Secondary | ICD-10-CM

## 2020-11-17 DIAGNOSIS — G35 Multiple sclerosis: Secondary | ICD-10-CM | POA: Diagnosis not present

## 2020-11-17 DIAGNOSIS — G40009 Localization-related (focal) (partial) idiopathic epilepsy and epileptic syndromes with seizures of localized onset, not intractable, without status epilepticus: Secondary | ICD-10-CM

## 2020-11-17 LAB — CBC WITH DIFFERENTIAL/PLATELET
Basophils Absolute: 0 10*3/uL (ref 0.0–0.1)
Basophils Relative: 0.7 % (ref 0.0–3.0)
Eosinophils Absolute: 0.2 10*3/uL (ref 0.0–0.7)
Eosinophils Relative: 2.5 % (ref 0.0–5.0)
HCT: 39.2 % (ref 36.0–46.0)
Hemoglobin: 13 g/dL (ref 12.0–15.0)
Lymphocytes Relative: 37.6 % (ref 12.0–46.0)
Lymphs Abs: 2.4 10*3/uL (ref 0.7–4.0)
MCHC: 33.1 g/dL (ref 30.0–36.0)
MCV: 83.7 fl (ref 78.0–100.0)
Monocytes Absolute: 0.8 10*3/uL (ref 0.1–1.0)
Monocytes Relative: 12.2 % — ABNORMAL HIGH (ref 3.0–12.0)
Neutro Abs: 3 10*3/uL (ref 1.4–7.7)
Neutrophils Relative %: 47 % (ref 43.0–77.0)
Platelets: 273 10*3/uL (ref 150.0–400.0)
RBC: 4.68 Mil/uL (ref 3.87–5.11)
RDW: 14.7 % (ref 11.5–15.5)
WBC: 6.4 10*3/uL (ref 4.0–10.5)

## 2020-11-17 LAB — VITAMIN B12: Vitamin B-12: 357 pg/mL (ref 211–911)

## 2020-11-17 LAB — VITAMIN D 25 HYDROXY (VIT D DEFICIENCY, FRACTURES): VITD: 27.46 ng/mL — ABNORMAL LOW (ref 30.00–100.00)

## 2020-11-17 LAB — TSH: TSH: 2.98 u[IU]/mL (ref 0.35–5.50)

## 2020-11-17 MED ORDER — VITAMIN D (ERGOCALCIFEROL) 1.25 MG (50000 UNIT) PO CAPS: 50000.0000 [IU] | ORAL_CAPSULE | ORAL | 0 refills | Status: DC

## 2020-11-17 NOTE — Progress Notes (Signed)
Advised of results, to check at pharmacy for medication

## 2020-11-17 NOTE — Progress Notes (Signed)
v

## 2020-11-20 DIAGNOSIS — F2081 Schizophreniform disorder: Secondary | ICD-10-CM | POA: Diagnosis not present

## 2020-11-21 DIAGNOSIS — F2081 Schizophreniform disorder: Secondary | ICD-10-CM | POA: Diagnosis not present

## 2020-11-28 DIAGNOSIS — F2081 Schizophreniform disorder: Secondary | ICD-10-CM | POA: Diagnosis not present

## 2020-11-28 NOTE — Telephone Encounter (Signed)
Patient has been approved for [facility].   Active date: 11/28/20. [facility] pharmacy will supply medication: [phone]

## 2020-12-05 ENCOUNTER — Encounter: Payer: Self-pay | Admitting: Family Medicine

## 2020-12-05 ENCOUNTER — Telehealth (INDEPENDENT_AMBULATORY_CARE_PROVIDER_SITE_OTHER): Payer: BC Managed Care – PPO | Admitting: Family Medicine

## 2020-12-05 VITALS — Ht 67.0 in

## 2020-12-05 DIAGNOSIS — R1030 Lower abdominal pain, unspecified: Secondary | ICD-10-CM | POA: Diagnosis not present

## 2020-12-05 DIAGNOSIS — R059 Cough, unspecified: Secondary | ICD-10-CM

## 2020-12-05 DIAGNOSIS — J069 Acute upper respiratory infection, unspecified: Secondary | ICD-10-CM | POA: Diagnosis not present

## 2020-12-05 MED ORDER — BENZONATATE 100 MG PO CAPS: 200.0000 mg | ORAL_CAPSULE | Freq: Two times a day (BID) | ORAL | 0 refills | Status: AC | PRN

## 2020-12-05 MED ORDER — FLUTICASONE PROPIONATE 50 MCG/ACT NA SUSP: 1.0000 | Freq: Two times a day (BID) | NASAL | 0 refills | Status: DC

## 2020-12-05 NOTE — Progress Notes (Signed)
Virtual Visit via Video Note I connected with [name] on 12/05/20 by a video enabled telemedicine application and verified that I am speaking with the correct person using two identifiers.  Location patient: home Location provider:work office Persons participating in the virtual visit: patient, provider  I discussed the limitations of evaluation and management by telemedicine and the availability of in person appointments. The patient expressed understanding and agreed to proceed.  Chief Complaint  Patient presents with   covid symptoms    Ongoing for a week. Pt did take a covid test yesterday and it was negative. Pt is experiencing a runny nose and cough, no fever, no sore throat.    HPI: Stephanie Cabrera is a 20 yo female with hx of MS,allergic rhinitis, and bipolar disorder c/o respiratory symptoms as described above.  URI  This is a new problem. The current episode started in the past 7 days. The problem has been gradually improving. There has been no fever. Associated symptoms include abdominal pain, congestion, coughing and rhinorrhea. Pertinent negatives include no diarrhea, dysuria, ear pain, headaches, joint pain, joint swelling, nausea, neck pain, rash, sinus pain, swollen glands, vomiting or wheezing. She has tried sleep for the symptoms. The treatment provided mild relief.  Sore throat has resolved. Her daughter attends daycare and was having rhinorrhea a few days ago. Yesterday conjunctival erythema and eye pruritus.  Negative for purulent discharge.  She has tried OTC Equile nasal spray and mucinex. Negative home COVID 19 test yesterday.  Yesterday she felt fatigue and started with lower abdominal pain, cramps like, intermittent,and not radiated. Last bowel movement 3 to 4 days ago, which is her normal. Negative for nausea, vomiting, urinary symptoms, blood in the stool, or melena.  She has not tried OTC medication for constipation. In the past she has had similar abdominal pain  and alleviated by defecation. She has not identified exacerbating factors.  ROS: See pertinent positives and negatives per HPI.  Past Medical History:  Diagnosis Date   Allergy    Medical history non-contributory    Seizures (HCC)    Past Surgical History:  Procedure Laterality Date   NO PAST SURGERIES     Family History  Problem Relation Age of Onset   Diabetes Paternal Grandmother    Diabetes Paternal Grandfather    Hypertension Paternal Grandfather    Social History   Socioeconomic History   Marital status: Single    Spouse name: Not on file   Number of children: 1   Years of education: Not on file   Highest education level: Not on file  Occupational History   Not on file  Tobacco Use   Smoking status: Never   Smokeless tobacco: Never  Vaping Use   Vaping Use: Never used  Substance and Sexual Activity   Alcohol use: Never    Alcohol/week: 0.0 standard drinks   Drug use: Never   Sexual activity: Not Currently    Birth control/protection: None  Other Topics Concern   Not on file  Social History Narrative   Right Handed   One Story    Lives with mom and daughter   No Caffeine   Social Determinants of Health   Financial Resource Strain: Not on file  Food Insecurity: Not on file  Transportation Needs: Not on file  Physical Activity: Not on file  Stress: Not on file  Social Connections: Not on file  Intimate Partner Violence: Not on file   Current Outpatient Medications:    Oxcarbazepine (TRILEPTAL) 300 MG  tablet, Take 1 tablet daily, Disp: 30 tablet, Rfl: 11   Vitamin D, Ergocalciferol, (DRISDOL) 1.25 MG (50000 UNIT) CAPS capsule, Take 1 capsule (50,000 Units total) by mouth every 7 (seven) days. Take 1 capsule once a week for 8 weeks, Disp: 8 capsule, Rfl: 0  EXAM:  VITALS per patient if applicable:Ht 5\' 7"  (1.702 m)   LMP 12/01/2020   BMI 31.76 kg/m   GENERAL: alert, oriented, appears well and in no acute distress  HEENT: atraumatic,  conjunctiva clear, no obvious abnormalities on inspection of external nose and ears  NECK: normal movements of the head and neck  LUNGS: on inspection no signs of respiratory distress, breathing rate appears normal, no obvious gross SOB, gasping or wheezing  CV: no obvious cyanosis  MS: moves all visible extremities without noticeable abnormality  PSYCH/NEURO: pleasant and cooperative, no obvious depression or anxiety, speech and thought processing grossly intact  ASSESSMENT AND PLAN:  Discussed the following assessment and plan:  Lower abdominal pain We discussed possible etiologies. Most likely related with constipation. Adequate fiber and fluid intake. Recommend OTC MiraLAX daily as needed. I do not think imaging is needed at this time. She was clearly instructed about warning signs.  URI, acute Symptoms suggests a viral etiology, improving. Symptomatic treatment recommended at this time. Differential diagnosis discussed, allergies may aggravate problem. Flonase nasal spray daily as needed to help with nasal congestion. Instructed to monitor for signs of complications, including new onset of fever among some, instructed about warning signs.  F/U as needed.  Cough - Plan: benzonatate (TESSALON) 100 MG capsule I also explained that cough and nasal congestion can last a few days and sometimes weeks after acute symptoms have resolved. Adequate hydration. Symptomatic treatment with benzonatate recommended. Continue OTC plain Mucinex.  We discussed possible serious and likely etiologies, options for evaluation and workup, limitations of telemedicine visit vs in person visit, treatment, treatment risks and precautions.  I discussed the assessment and treatment plan with the patient. The patient was provided an opportunity to ask questions and all were answered. The patient agreed with the plan and demonstrated an understanding of the instructions.  Return if symptoms worsen or  fail to improve.  Stephanie Cabrera 12/03/2020, MD

## 2020-12-05 NOTE — Progress Notes (Deleted)
Virtual Visit via Video Note  I connected with Stephanie Cabrera on 12/05/20 at  3:30 PM EDT by a video enabled telemedicine application and verified that I am speaking with the correct person using two identifiers.  Location: Patient: home Provider: work office   I discussed the limitations of evaluation and management by telemedicine and the availability of in person appointments. The patient expressed understanding and agreed to proceed.  History of Present Illness: URI  This is a new problem. The current episode started in the past 7 days. The problem has been gradually worsening. There has been no fever. Associated symptoms include congestion, coughing and rhinorrhea. Pertinent negatives include no abdominal pain, chest pain, diarrhea, dysuria, ear pain, headaches, joint pain, joint swelling, nausea, neck pain, plugged ear sensation, rash, sinus pain, sneezing, sore throat, swollen glands, vomiting or wheezing.  A covid test was taken yesterday and it was negative.     Observations/Objective:   Assessment and Plan:   Follow Up Instructions:    I discussed the assessment and treatment plan with the patient. The patient was provided an opportunity to ask questions and all were answered. The patient agreed with the plan and demonstrated an understanding of the instructions.   The patient was advised to call back or seek an in-person evaluation if the symptoms worsen or if the condition fails to improve as anticipated.  I provided *** minutes of non-face-to-face time during this encounter.

## 2020-12-11 DIAGNOSIS — F2081 Schizophreniform disorder: Secondary | ICD-10-CM | POA: Diagnosis not present

## 2020-12-25 DIAGNOSIS — F2081 Schizophreniform disorder: Secondary | ICD-10-CM | POA: Diagnosis not present

## 2020-12-29 ENCOUNTER — Telehealth: Payer: Self-pay | Admitting: Family Medicine

## 2020-12-29 NOTE — Telephone Encounter (Signed)
Patient needs to know if it okay to donate plasma or not. If patient is ok to donate, she will need a letter stating so to give to plasma center.    Good callback number for patient is [phone]     Please Advise

## 2021-01-01 DIAGNOSIS — F2081 Schizophreniform disorder: Secondary | ICD-10-CM | POA: Diagnosis not present

## 2021-01-01 NOTE — Telephone Encounter (Signed)
I left patient a voicemail letting her know that the letter is completed & on her mychart. Advised her to call back with any questions.

## 2021-01-01 NOTE — Telephone Encounter (Signed)
I do not see any contraindication for her to donate plasma, so letter can be provided. Thanks, BJ

## 2021-01-08 DIAGNOSIS — F2081 Schizophreniform disorder: Secondary | ICD-10-CM | POA: Diagnosis not present

## 2021-01-11 ENCOUNTER — Telehealth (INDEPENDENT_AMBULATORY_CARE_PROVIDER_SITE_OTHER): Payer: BC Managed Care – PPO | Admitting: Family Medicine

## 2021-01-11 ENCOUNTER — Encounter: Payer: Self-pay | Admitting: Family Medicine

## 2021-01-11 VITALS — Wt 210.0 lb

## 2021-01-11 DIAGNOSIS — R197 Diarrhea, unspecified: Secondary | ICD-10-CM

## 2021-01-11 NOTE — Patient Instructions (Signed)
   ---------------------------------------------------------------------------------------------------------------------------      WORK SLIP:  Patient Stephanie Cabrera,  2000-08-25, was seen for a medical visit today, 01/11/21 . Please excuse from work for and illness. Advise resolution of diarrhea and symptoms for 24 hours and a negative covid testing (2 sequential home tests 24-48 hours apart or PCR test) prior to return to work.  Sincerely: E-signature: Dr. Kriste Basque, DO [facility] [place] Ph: [phone]   ------------------------------------------------------------------------------------------------------------------------------   -drink plenty of water  -imodium if needed if any further diarrhea - follow instructions  -do another covid test  -no dairy or red meat until all better  I hope you are feeling better soon!  Seek in person care promptly if your symptoms worsen, new concerns arise or you are not improving with treatment.  It was nice to meet you today. I help [facility] out with telemedicine visits on Tuesdays and Thursdays and am available for visits on those days. If you have any concerns or questions following this visit please schedule a follow up visit with your Primary Care doctor or seek care at a local urgent care clinic to avoid delays in care.

## 2021-01-11 NOTE — Progress Notes (Signed)
Virtual Visit via Video Note  I connected with Stephanie Cabrera  on 01/11/21 at 12:40 PM EDT by a video enabled telemedicine application and verified that I am speaking with the correct person using two identifiers.  Location patient: home, [place] Location provider:work or home office Persons participating in the virtual visit: patient, provider  I discussed the limitations of evaluation and management by telemedicine and the availability of in person appointments. The patient expressed understanding and agreed to proceed.   HPI:  Acute telemedicine visit for diarrhea: -Onset: yesterday -Symptoms include: diarrhea (watery, multiple episodes), intermittent abd discomfort, nausea -Denies: fevers, vomiting, melena, hematochezia, resp symptoms, inability to eat/drink/get out of bed -denies recent travel, new foods, abx or sick contacts -Has tried:nothing -Pertinent past medical history:see below -Pertinent medication allergies:No Known Allergies -COVID-19 vaccine status: Immunization History  Administered Date(s) Administered   DTaP 07/06/1999, 10/05/2000, 06/16/2003, 12/19/2003   DTaP / HiB 07/06/1999, 09/08/2000   Hepatitis B, ped/adol 12/12/1998, 01/22/1999, 07/06/1999   IPV 07/06/1999, 09/08/2000, 06/16/2003   MMR 09/08/2000, 06/16/2003   Tdap 07/07/2018   Varicella 06/16/2003     ROS: See pertinent positives and negatives per HPI.  Past Medical History:  Diagnosis Date   Allergy    Medical history non-contributory    Seizures ([place])     Past Surgical History:  Procedure Laterality Date   NO PAST SURGERIES       Current Outpatient Medications:    Oxcarbazepine (TRILEPTAL) 300 MG tablet, Take 300 mg by mouth daily., Disp: , Rfl:    Vitamin D, Ergocalciferol, (DRISDOL) 1.25 MG (50000 UNIT) CAPS capsule, Take 1 capsule (50,000 Units total) by mouth every 7 (seven) days. Take 1 capsule once a week for 8 weeks, Disp: 8 capsule, Rfl: 0  EXAM:  VITALS per patient if  applicable:  GENERAL: alert, oriented, appears well and in no acute distress  HEENT: atraumatic, conjunttiva clear, no obvious abnormalities on inspection of external nose and ears  NECK: normal movements of the head and neck  LUNGS: on inspection no signs of respiratory distress, breathing rate appears normal, no obvious gross SOB, gasping or wheezing  CV: no obvious cyanosis  MS: moves all visible extremities without noticeable abnormality  PSYCH/NEURO: pleasant and cooperative, no obvious depression or anxiety, speech and thought processing grossly intact  ASSESSMENT AND PLAN:  Discussed the following assessment and plan:  Diarrhea, unspecified type  -we discussed possible serious and likely etiologies, options for evaluation and workup, limitations of telemedicine visit vs in person visit, treatment, treatment risks and precautions. Pt is agreeable to treatment via telemedicine at this moment. Query gastroenteritis, virus vs other. Opted for repeat covid testing, oral hydration, avoidance of dairy/red meat, imodium if needed.  Work/School slipped offered: provided in patient instructions  Advised to seek prompt in person care if worsening, new symptoms arise, or if is not improving with treatment. Discussed options for inperson care if PCP office not available. Did let this patient know that I only do telemedicine on Tuesdays and Thursdays for [place]. Advised to schedule follow up visit with PCP or [facility] if any further questions or concerns to avoid delays in care.   I discussed the assessment and treatment plan with the patient. The patient was provided an opportunity to ask questions and all were answered. The patient agreed with the plan and demonstrated an understanding of the instructions.     Lucretia Kern, DO

## 2021-01-22 ENCOUNTER — Other Ambulatory Visit: Payer: Self-pay

## 2021-01-22 ENCOUNTER — Ambulatory Visit (INDEPENDENT_AMBULATORY_CARE_PROVIDER_SITE_OTHER): Payer: BC Managed Care – PPO

## 2021-01-22 VITALS — BP 120/72 | HR 69 | Temp 98.1°F | Resp 16 | Ht 67.0 in | Wt 204.8 lb

## 2021-01-22 DIAGNOSIS — G35 Multiple sclerosis: Secondary | ICD-10-CM | POA: Diagnosis not present

## 2021-01-22 DIAGNOSIS — F2081 Schizophreniform disorder: Secondary | ICD-10-CM | POA: Diagnosis not present

## 2021-01-22 MED ORDER — DIPHENHYDRAMINE HCL 25 MG PO CAPS
50.0000 mg | ORAL_CAPSULE | Freq: Once | ORAL | Status: AC
  Administered 2021-01-22: 50 mg via ORAL
  Filled 2021-01-22: qty 2

## 2021-01-22 MED ORDER — METHYLPREDNISOLONE SODIUM SUCC 125 MG IJ SOLR
125.0000 mg | Freq: Once | INTRAMUSCULAR | Status: AC
  Administered 2021-01-22: 125 mg via INTRAVENOUS
  Filled 2021-01-22: qty 2

## 2021-01-22 MED ORDER — METHYLPREDNISOLONE SODIUM SUCC 125 MG IJ SOLR: 125.0000 mg | Freq: Once | INTRAMUSCULAR | Status: DC | PRN

## 2021-01-22 MED ORDER — SODIUM CHLORIDE 0.9% FLUSH: 10.0000 mL | Freq: Once | INTRAVENOUS | Status: DC | PRN

## 2021-01-22 MED ORDER — SODIUM CHLORIDE 0.9 % IV SOLN: Freq: Once | INTRAVENOUS | Status: DC | PRN

## 2021-01-22 MED ORDER — DIPHENHYDRAMINE HCL 50 MG/ML IJ SOLN: 50.0000 mg | Freq: Once | INTRAMUSCULAR | Status: DC | PRN

## 2021-01-22 MED ORDER — ACETAMINOPHEN 325 MG PO TABS
650.0000 mg | ORAL_TABLET | Freq: Once | ORAL | Status: AC
  Administered 2021-01-22: 650 mg via ORAL
  Filled 2021-01-22: qty 2

## 2021-01-22 MED ORDER — HEPARIN SOD (PORK) LOCK FLUSH 100 UNIT/ML IV SOLN: 500.0000 [IU] | Freq: Once | INTRAVENOUS | Status: DC | PRN

## 2021-01-22 MED ORDER — HEPARIN SOD (PORK) LOCK FLUSH 100 UNIT/ML IV SOLN: 250.0000 [IU] | Freq: Once | INTRAVENOUS | Status: DC | PRN

## 2021-01-22 MED ORDER — EPINEPHRINE 0.3 MG/0.3ML IJ SOAJ: 0.3000 mg | Freq: Once | INTRAMUSCULAR | Status: DC | PRN

## 2021-01-22 MED ORDER — ALBUTEROL SULFATE HFA 108 (90 BASE) MCG/ACT IN AERS: 2.0000 | INHALATION_SPRAY | Freq: Once | RESPIRATORY_TRACT | Status: DC | PRN

## 2021-01-22 MED ORDER — ALTEPLASE 2 MG IJ SOLR: 2.0000 mg | Freq: Once | INTRAMUSCULAR | Status: DC | PRN

## 2021-01-22 MED ORDER — ANTICOAGULANT SODIUM CITRATE 4% (200MG/5ML) IV SOLN
5.0000 mL | Freq: Once | Status: DC | PRN
  Filled 2021-01-22: qty 5

## 2021-01-22 MED ORDER — SODIUM CHLORIDE 0.9 % IV SOLN
600.0000 mg | Freq: Once | INTRAVENOUS | Status: AC
  Administered 2021-01-22: 600 mg via INTRAVENOUS
  Filled 2021-01-22: qty 20

## 2021-01-22 MED ORDER — SODIUM CHLORIDE 0.9% FLUSH: 3.0000 mL | Freq: Once | INTRAVENOUS | Status: DC | PRN

## 2021-01-22 MED ORDER — FAMOTIDINE IN NACL 20-0.9 MG/50ML-% IV SOLN: 20.0000 mg | Freq: Once | INTRAVENOUS | Status: DC | PRN

## 2021-01-22 NOTE — Progress Notes (Signed)
Diagnosis: Multiple Sclerosis  Provider:  Chilton Greathouse, MD  Procedure: Infusion  IV Type: Peripheral, IV Location: L Hand  Ocrevus (Ocrelizumab), Dose: 600 mg  Infusion Start Time: 0857  Infusion Stop Time: 1300  Post Infusion IV Care: Observation period completed and Peripheral IV Discontinued  Discharge: Condition: Good, Destination: Home . AVS provided to patient.   Performed by:  Tranika Scholler, Lyman Speller, LPN

## 2021-01-29 DIAGNOSIS — F2081 Schizophreniform disorder: Secondary | ICD-10-CM | POA: Diagnosis not present

## 2021-02-05 DIAGNOSIS — F2081 Schizophreniform disorder: Secondary | ICD-10-CM | POA: Diagnosis not present

## 2021-02-13 DIAGNOSIS — F2081 Schizophreniform disorder: Secondary | ICD-10-CM | POA: Diagnosis not present

## 2021-02-20 DIAGNOSIS — F2081 Schizophreniform disorder: Secondary | ICD-10-CM | POA: Diagnosis not present

## 2021-03-05 DIAGNOSIS — F2081 Schizophreniform disorder: Secondary | ICD-10-CM | POA: Diagnosis not present

## 2021-03-12 DIAGNOSIS — F2081 Schizophreniform disorder: Secondary | ICD-10-CM | POA: Diagnosis not present

## 2021-03-20 DIAGNOSIS — F2081 Schizophreniform disorder: Secondary | ICD-10-CM | POA: Diagnosis not present

## 2021-03-23 ENCOUNTER — Other Ambulatory Visit (INDEPENDENT_AMBULATORY_CARE_PROVIDER_SITE_OTHER): Payer: BC Managed Care – PPO

## 2021-03-23 ENCOUNTER — Ambulatory Visit (INDEPENDENT_AMBULATORY_CARE_PROVIDER_SITE_OTHER): Payer: BC Managed Care – PPO | Admitting: Neurology

## 2021-03-23 ENCOUNTER — Other Ambulatory Visit: Payer: Self-pay

## 2021-03-23 ENCOUNTER — Encounter: Payer: Self-pay | Admitting: Neurology

## 2021-03-23 VITALS — BP 134/78 | HR 82 | Resp 20 | Ht 67.0 in | Wt 212.0 lb

## 2021-03-23 DIAGNOSIS — G35 Multiple sclerosis: Secondary | ICD-10-CM

## 2021-03-23 DIAGNOSIS — E559 Vitamin D deficiency, unspecified: Secondary | ICD-10-CM

## 2021-03-23 DIAGNOSIS — G40009 Localization-related (focal) (partial) idiopathic epilepsy and epileptic syndromes with seizures of localized onset, not intractable, without status epilepticus: Secondary | ICD-10-CM

## 2021-03-23 LAB — VITAMIN D 25 HYDROXY (VIT D DEFICIENCY, FRACTURES): Vit D, 25-Hydroxy: 21 ng/mL — ABNORMAL LOW (ref 30–100)

## 2021-03-23 NOTE — Patient Instructions (Addendum)
Always good to see you.  Schedule repeat MRI brain with and without contrast  2. Bloodwork for vitamin D level  3.Proceed with Ocrevus infusion in April, follow-up with me after   Seizure Precautions: 1. If medication has been prescribed for you to prevent seizures, take it exactly as directed.  Do not stop taking the medicine without talking to your doctor first, even if you have not had a seizure in a long time.   2. Avoid activities in which a seizure would cause danger to yourself or to others.  Don't operate dangerous machinery, swim alone, or climb in high or dangerous places, such as on ladders, roofs, or girders.  Do not drive unless your doctor says you may.  3. If you have any warning that you may have a seizure, lay down in a safe place where you can't hurt yourself.    4.  No driving for 6 months from last seizure, as per [facility].   Please refer to the following link on the [employer]ile[employer]'s website for more information: http://www.epilepsyfoundation.org/answerplace/Social/driving/drivingu.cfm   5.  Maintain good sleep hygiene. Avoid alcohol.  6.  Notify your neurology if you are planning pregnancy or if you become pregnant.  7.  Contact your doctor if you have any problems that may be related to the medicine you are taking.  8.  Call 911 and bring the patient back to the ED if:        A.  The seizure lasts longer than 5 minutes.       B.  The patient doesn't awaken shortly after the seizure  C.  The patient has new problems such as difficulty seeing, speaking or moving  D.  The patient was injured during the seizure  E.  The patient has a temperature over 102 F (39C)  F.  The patient vomited and now is having trouble breathing       We have sent a referral to [facility]: [facility] for your MRI and they will call you directly to schedule your appointment. They are located at [address] [facility]. If you need to contact them directly please  call [phone].   Your provider has requested that you have labwork completed today. Please go to [facility] ([id number]) on the second floor of this building before leaving the office today. You do not need to check in. If you are not called within 15 minutes please check with the front desk.

## 2021-03-23 NOTE — Progress Notes (Signed)
NEUROLOGY FOLLOW UP OFFICE NOTE  Stephanie Cabrera [id number] [birth year]  HISTORY OF PRESENT ILLNESS: I had the pleasure of seeing Stephanie Cabrera in follow-up in the neurology clinic on 03/23/2021.  The patient was last seen 4 months ago for MS and seizures. She is alone in the office today. Since her last visit, she reports doing great. She self-discontinued oxcarbazepine a few months ago with no seizure recurrence. She has been seizure-free since 01/2020. She denies any staring/unresponsive episodes, gaps in time, olfactory/gustatory hallucinations, focal weakness, myoclonic jerks. She continues to have constant right hand tingling, unchanged from prior. No headaches, dizziness, vision changes, bowel/bladder dysfunction, no falls. No further auditory hallucinations, she has been off Risperdal for almost a year. She had her infusion of Ocrevus last 01/22/21 with no issues. She still has some memory changes where she forgets conversations. Sleep is off now that she works third shift for [employer]. She lives with her mother and 53 year old daughter.    History on Initial Assessment 11/02/2019: This is a pleasant 20 year old right-handed woman with a history of anxiety presenting for evaluation of seizure-like activity that occurred on 09/02/2019. She was in her usual state of health until 09/02/19 when she recalls sitting on the commode then waking up in the ambulance. Her mother reports that she was tired and took 4 tablets of melatonin 3mg  which her mother thought was too much. She increased her water intake and was in the bathroom when she called her mother saying her right hand was going numb, then she started losing consciousness and fell off the toilet. Her mother laid her on the ground and saw her whole body stiffening and shaking for 3 minutes. She bit her tongue. When EMS arrived, she could say her name and follow instructions, no focal weakness. She left the ER AMA. She saw her PCP  on 09/15/19 and reported increassed stress worse after her baby was born in 08/2018. A week later, she was brought to the ER on 6/17 for IVC because she started destroying her TV and lamp in her bedroom. Family walked into the room and she was naked on her dresser with items around her. Family calmed her down and she indicated no memory of the event. According to IVC paperwork, this has happened 2 times previously, she had told them she has been speaking to God. She had not been sleeping well. Head CT no acute changes. 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 Per notes, she had been having auditory hallucinations for several weeks to months, however her mother states she started hallucinating after the seizure in May. She went back to [facility] on 7/8 due to recurrence of auditory hallucinations. Dose increased to 4mg  qhs yesterday. She denies any further hallucinations. She states her mood is good, she denies any depression or anxiety. Her mother reports that when the seizure occurred, her anxiety was bad. 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 She is now sleeping better.   She has had constant numbness in her hands since she gave birth a year ago, R>L. She denies any headaches, dizziness, diplopia, neck/back pain, bowel/bladder dysfunction. She has occasional episodes of a metallic taste in her mouth for a few minutes that occur a couple of times a month. She has occasional jerks in her back. Her mother denies any  staring/unresponsive episodes. She lives with her mother and 82 year old daughter Stephanie Cabrera. Her mother feels she is back to baseline and is mostly worried about her memory. Since the seizure, she cannot remember where she puts things. Her mother administers her medication. She has not been driving. She denies any alcohol or illicit drug use.   Epilepsy Risk Factors:  Her  paternal grandfather had seizures. Otherwise she had a normal birth and early development.  There is no history of febrile convulsions, CNS infections such as meningitis/encephalitis, significant traumatic brain injury, neurosurgical procedures.  Update 02/04/20: She had another seizure that occurred in her sleep at 3am last 01/12/20. Her mother heard her yell out and found her having a GTC that lasted less than a minute. She was confused after, no focal weakness. She was started on oxcarbazepine 300mg  BID. She has since also finished a 3-day course of IV Solumedrol. She presents to discuss MRI cervical and thoracic spine with and without contrast done 02/02/20. The cervical cord appears mildly expanded with diffuse heterogeneous T2 signal from C1 through C5, no abnormal enhancement. There is minimal heterogeneity in the proximal thoracic cord with inferior extension to the T3-4 disc space level, no abnormal enhancement.   Routine and 24-hour EEG in 11/2019 which were abnormal due to focal slowing over the bilateral temporal regions, right greater than left. There was also note of frontal intermittent rhythmic delta activity (FIRDA). No epileptiform discharges seen, typical events not captured.  MRI brain with and without contrast in 01/2020 showed numerous white matter lesions, many are periventricular, some are subcortical and deep white matter. There is a 10x5mm lesion in the left brachium pontis with additional smaller lesions in the brachium pontis bilaterally. There was some restricted diffusion in the left mid-frontal lobe, multiple enhancing lesions in the anterior frontal lobes bilaterally, right middle frontal lobe, right parietal periventricular white matter. Findings felt compatible with multiple sclerosis. MRI cervical and thoracic spine with and without contrast done 02/02/20. The cervical cord appears mildly expanded with diffuse heterogeneous T2 signal from C1 through C5, no abnormal  enhancement. There is minimal heterogeneity in the proximal thoracic cord with inferior extension to the T3-4 disc space level, no abnormal enhancement.    PAST MEDICAL HISTORY: Past Medical History:  Diagnosis Date   Allergy    Medical history non-contributory    Seizures (HCC)     MEDICATIONS: Current Outpatient Medications on File Prior to Visit  Medication Sig Dispense Refill   Oxcarbazepine (TRILEPTAL) 300 MG tablet Take 300 mg by mouth daily. (Patient not taking: Reported on 03/23/2021)     Vitamin D, Ergocalciferol, (DRISDOL) 1.25 MG (50000 UNIT) CAPS capsule Take 1 capsule (50,000 Units total) by mouth every 7 (seven) days. Take 1 capsule once a week for 8 weeks (Patient not taking: Reported on 03/23/2021) 8 capsule 0   No current facility-administered medications on file prior to visit.    ALLERGIES: No Known Allergies  FAMILY HISTORY: Family History  Problem Relation Age of Onset   Diabetes Paternal Grandmother    Diabetes Paternal Grandfather    Hypertension Paternal Grandfather     SOCIAL HISTORY: Social History   Socioeconomic History   Marital status: Single    Spouse name: Not on file   Number of children: 1   Years of education: Not on file   Highest education level: Not on file  Occupational History   Not on file  Tobacco Use   Smoking status: Never   Smokeless tobacco:  Never  Vaping Use   Vaping Use: Never used  Substance and Sexual Activity   Alcohol use: Never    Alcohol/week: 0.0 standard drinks   Drug use: Never   Sexual activity: Not Currently    Birth control/protection: None  Other Topics Concern   Not on file  Social History Narrative   Right Handed   One Story    Lives with mom and daughter   No Caffeine   Social Determinants of Health   Financial Resource Strain: Not on file  Food Insecurity: Not on file  Transportation Needs: Not on file  Physical Activity: Not on file  Stress: Not on file  Social Connections: Not on  file  Intimate Partner Violence: Not on file     PHYSICAL EXAM: Vitals:   03/23/21 1443  BP: 134/78  Pulse: 82  Resp: 20  SpO2: 97%   General: No acute distress Head:  Normocephalic/atraumatic Skin/Extremities: No rash, no edema Neurological Exam: alert and awake. No aphasia or dysarthria. Fund of knowledge is appropriate.  Attention and concentration are normal.   Cranial nerves: Pupils equal, round. Extraocular movements intact with no nystagmus. Visual fields full.  No facial asymmetry.  Motor: Bulk and tone normal, muscle strength 5/5 throughout with no pronator drift.   Finger to nose testing intact.  Gait narrow-based and steady, able to tandem walk adequately.  Romberg negative.   IMPRESSION: This is a pleasant 20 yo RH woman with a history of anxiety, new onset seizures, and newly diagnosed MS in October 2021. Auditory hallucinations have quieted down, none in almost a year off Risperdal. She has also self-discontinued the oxcarbazepine, she has been seizure-free for a year, we discussed that if seizures recur, she will need to restart oxcarbazepine. She denies any MS flares, interval MRI brain with and without contrast will be ordered. Check vitamin D level. On her next visit, we will do immunoglobulins. She is aware of [place] driving laws to stop driving after a seizure until 6 months seizure-free. We discussed avoidance of seizure triggers, including sleep deprivation and alcohol. Follow-up in 4 months, call for any changes.   Thank you for allowing me to participate in her care.  Please do not hesitate to call for any questions or concerns.    Patrcia Dolly, M.D.   CC: Dr. [place]

## 2021-03-24 DIAGNOSIS — F2081 Schizophreniform disorder: Secondary | ICD-10-CM | POA: Diagnosis not present

## 2021-03-26 ENCOUNTER — Other Ambulatory Visit: Payer: Self-pay | Admitting: Neurology

## 2021-03-26 ENCOUNTER — Telehealth: Payer: Self-pay

## 2021-03-26 DIAGNOSIS — G35 Multiple sclerosis: Secondary | ICD-10-CM

## 2021-03-26 MED ORDER — VITAMIN D (ERGOCALCIFEROL) 1.25 MG (50000 UNIT) PO CAPS: ORAL_CAPSULE | ORAL | 0 refills | Status: DC

## 2021-03-26 NOTE — Telephone Encounter (Signed)
-----   Message from Van Clines, MD sent at 03/26/2021  9:46 AM EST ----- Pls let her know vitamin D level is still low, she will need to take the high dose supplement once a week for 8 weeks again. Rx sent, thanks

## 2021-03-26 NOTE — Telephone Encounter (Signed)
Pt called an informed vitamin D level is still low, she will need to take the high dose supplement once a week for 8 weeks again

## 2021-03-28 DIAGNOSIS — F2081 Schizophreniform disorder: Secondary | ICD-10-CM | POA: Diagnosis not present

## 2021-04-04 DIAGNOSIS — F2081 Schizophreniform disorder: Secondary | ICD-10-CM | POA: Diagnosis not present

## 2021-04-08 ENCOUNTER — Encounter (HOSPITAL_COMMUNITY): Payer: Self-pay | Admitting: *Deleted

## 2021-04-08 ENCOUNTER — Other Ambulatory Visit: Payer: Self-pay

## 2021-04-08 ENCOUNTER — Ambulatory Visit (HOSPITAL_COMMUNITY)
Admission: EM | Admit: 2021-04-08 | Discharge: 2021-04-08 | Disposition: A | Discharge status: Home / Self Care | Payer: BC Managed Care – PPO | Attending: Physician Assistant | Admitting: Physician Assistant

## 2021-04-08 DIAGNOSIS — R109 Unspecified abdominal pain: Secondary | ICD-10-CM | POA: Diagnosis not present

## 2021-04-08 DIAGNOSIS — R8290 Unspecified abnormal findings in urine: Secondary | ICD-10-CM | POA: Diagnosis not present

## 2021-04-08 DIAGNOSIS — R1084 Generalized abdominal pain: Secondary | ICD-10-CM | POA: Insufficient documentation

## 2021-04-08 LAB — POCT URINALYSIS DIPSTICK, ED / UC
Bilirubin Urine: NEGATIVE
Glucose, UA: NEGATIVE mg/dL
Hgb urine dipstick: NEGATIVE
Ketones, ur: NEGATIVE mg/dL
Nitrite: NEGATIVE
Protein, ur: NEGATIVE mg/dL
Specific Gravity, Urine: 1.025 (ref 1.005–1.030)
Urobilinogen, UA: 0.2 mg/dL (ref 0.0–1.0)
pH: 6 (ref 5.0–8.0)

## 2021-04-08 LAB — POC URINE PREG, ED: Preg Test, Ur: NEGATIVE

## 2021-04-08 MED ORDER — DICYCLOMINE HCL 20 MG PO TABS: 20.0000 mg | ORAL_TABLET | Freq: Two times a day (BID) | ORAL | 0 refills | Status: DC

## 2021-04-08 NOTE — Discharge Instructions (Signed)
Your urine did have some white blood cells which could be the sign of an infection.  We will send this off for culture and contact you if we need to start any antibiotics.  I have called in dicyclomine to help with your symptoms.  Take this twice a day.  Eat a very bland diet and avoid spicy/acidic/fatty foods.  Make sure you are drinking plenty of fluid.  If you develop any worsening symptoms including fever, worsening abdominal pain, nausea/vomiting, blood in your stool you need to be seen immediately.  If symptoms are not worsening but not improving please follow-up with GI as we discussed.

## 2021-04-08 NOTE — ED Provider Notes (Signed)
[facility]    CSN: [id number] Arrival date & time: 04/08/21  1046      History   Chief Complaint Chief Complaint  Patient presents with   Abdominal Pain    HPI Stephanie Cabrera is a 21 y.o. female.   Patient presents today with a 3-day history of generalized abdominal pain.  She describes pain is rated 6 on a 0-10 pain scale, generalized throughout abdomen, described as cramping with periodic sharp pains, no alleviating factors identified.  She does report one loose bowel movement but denies any diarrhea, blood in her stool, mucus in stool.  Denies any fever, nausea, vomiting, chest pain, shortness of breath, weakness.  Denies any suspicious food intake, medication changes, recent antibiotic use, recent travel, known sick contacts.  She denies history of gastrointestinal disorder including ulcerative colitis or Crohn's disease.  Denies previous abdominal surgery and still has gallbladder and appendix.  She has not tried any over-the-counter medication for symptom management.  She denies any urinary symptoms or vaginal symptoms.   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 Authorizing Provider  dicyclomine (BENTYL) 20 MG tablet Take 1 tablet (20 mg total) by mouth 2 (two) times daily. 04/08/21  Yes Calirose Mccance, Derry Skill, PA-C  Oxcarbazepine (TRILEPTAL) 300 MG tablet Take 300 mg by  mouth daily. Patient not taking: Reported on 03/23/2021    [provider]  Vitamin D, Ergocalciferol, (DRISDOL) 1.25 MG (50000 UNIT) CAPS capsule Take 1 capsule once a week for 8 weeks 03/26/21   Cameron Sprang, MD    Family History Family History  Problem Relation Age of Onset   Diabetes Paternal Grandmother    Diabetes Paternal Grandfather    Hypertension Paternal Grandfather     Social History Social History   Tobacco Use   Smoking status: Never   Smokeless tobacco: Never  Vaping Use   Vaping Use: Never used  Substance Use Topics   Alcohol use: Never    Alcohol/week: 0.0 standard drinks   Drug use: Never     Allergies   Patient has no known allergies.   Review of Systems Review of Systems  Constitutional:  Positive for activity change. Negative for appetite change, fatigue and fever.  Respiratory:  Negative for cough and shortness of breath.   Cardiovascular:  Negative for chest pain.  Gastrointestinal:  Positive for abdominal pain. Negative for blood in stool, diarrhea, nausea and vomiting.  Genitourinary:  Negative for dysuria, frequency, pelvic pain, urgency, vaginal bleeding, vaginal discharge and vaginal pain.  Musculoskeletal:  Negative for arthralgias, back  pain and myalgias.  Neurological:  Negative for dizziness, light-headedness and headaches.    Physical Exam Triage Vital Signs ED Triage Vitals  Enc Vitals Group     BP 04/08/21 1254 139/85     Pulse Rate 04/08/21 1254 73     Resp 04/08/21 1254 18     Temp 04/08/21 1254 98.8 F (37.1 C)     Temp src --      SpO2 04/08/21 1254 100 %     Weight --      Height --      Head Circumference --      Peak Flow --      Pain Score 04/08/21 1252 6     Pain Loc --      Pain Edu? --      Excl. in [place]? --    No data found.  Updated Vital Signs BP 139/85    Pulse 73    Temp 98.8 F (37.1 C)    Resp 18    LMP 04/02/2021    SpO2 100%   Visual Acuity Right Eye Distance:   Left Eye Distance:    Bilateral Distance:    Right Eye Near:   Left Eye Near:    Bilateral Near:     Physical Exam Vitals reviewed.  Constitutional:      General: She is awake. She is not in acute distress.    Appearance: Normal appearance. She is well-developed. She is not ill-appearing.     Comments: Very pleasant female appears stated age in no acute distress sitting comfortably in exam room  HENT:     Head: Normocephalic and atraumatic.     Mouth/Throat:     Mouth: Mucous membranes are moist.  Cardiovascular:     Rate and Rhythm: Normal rate and regular rhythm.     Heart sounds: Normal heart sounds, S1 normal and S2 normal. No murmur heard. Pulmonary:     Effort: Pulmonary effort is normal.     Breath sounds: Normal breath sounds. No wheezing, rhonchi or rales.     Comments: Clear to auscultation bilaterally Abdominal:     General: Bowel sounds are normal.     Palpations: Abdomen is soft.     Tenderness: There is generalized abdominal tenderness. There is no right CVA tenderness, left CVA tenderness, guarding or rebound.     Comments: Tenderness palpation throughout abdomen.  No evidence of acute abdomen on physical exam.  No CVA tenderness.  Psychiatric:        Behavior: Behavior is cooperative.     UC Treatments / Results  Labs (all labs ordered are listed, but only abnormal results are displayed) Labs Reviewed  POCT URINALYSIS DIPSTICK, ED / UC - Abnormal; Notable for the following components:      Result Value   Leukocytes,Ua SMALL (*)    All other components within normal limits  URINE CULTURE  POC URINE PREG, ED    EKG   Radiology No results found.  Procedures Procedures (including critical care time)  Medications Ordered in UC Medications - No data to display  Initial Impression / Assessment and Plan / UC Course  I have reviewed the triage vital signs and the nursing notes.  Pertinent labs & imaging results that were available during my care of the patient were  reviewed by me and considered in my medical decision making (see chart for details).     Vital signs and physical exam reassuring today; no indication for emergent evaluation  or imaging.  UA obtained showed small leukocyte esterase but patient denies any urinary tract infection symptoms so antibiotics were deferred until culture results are obtained.  Pregnancy test was negative in clinic.  No indication for lab work as patient denies any significant diarrhea or nausea/vomiting.  Given clinical presentation will treat with dicyclomine twice daily.  Recommended she eat a bland diet and drink plenty of fluid.  Discussed that if she has any worsening symptoms including focal abdominal pain, worsening abdominal pain, fever, nausea/vomiting interfering with oral intake, diarrhea, blood in her stool she needs to go to the emergency room.  If symptoms are not worsening but also not improving she should follow-up with a GI specialist and was given contact information for local provider.  Strict return precautions given to which she expressed understanding.  Work excuse note provided.  Final Clinical Impressions(s) / UC Diagnoses   Final diagnoses:  Generalized abdominal pain  Abdominal cramping  Abnormal urinalysis     Discharge Instructions      Your urine did have some white blood cells which could be the sign of an infection.  We will send this off for culture and contact you if we need to start any antibiotics.  I have called in dicyclomine to help with your symptoms.  Take this twice a day.  Eat a very bland diet and avoid spicy/acidic/fatty foods.  Make sure you are drinking plenty of fluid.  If you develop any worsening symptoms including fever, worsening abdominal pain, nausea/vomiting, blood in your stool you need to be seen immediately.  If symptoms are not worsening but not improving please follow-up with GI as we discussed.     ED Prescriptions     Medication Sig Dispense Auth. Provider    dicyclomine (BENTYL) 20 MG tablet Take 1 tablet (20 mg total) by mouth 2 (two) times daily. 20 tablet Catrice Zuleta, Derry Skill, PA-C      PDMP not reviewed this encounter.   Terrilee Croak, PA-C 04/08/21 1335

## 2021-04-08 NOTE — ED Triage Notes (Signed)
Pt reports ABD pain for 3 days.

## 2021-04-09 LAB — URINE CULTURE: Culture: 60000 — AB

## 2021-04-14 ENCOUNTER — Ambulatory Visit
Admission: RE | Admit: 2021-04-14 | Discharge: 2021-04-14 | Disposition: A | Discharge status: Home / Self Care | Payer: BC Managed Care – PPO | Source: Ambulatory Visit | Attending: Neurology | Admitting: Neurology

## 2021-04-14 ENCOUNTER — Other Ambulatory Visit: Payer: Self-pay

## 2021-04-14 DIAGNOSIS — G35 Multiple sclerosis: Secondary | ICD-10-CM

## 2021-04-14 IMAGING — MR MR HEAD WO/W CM
13 series · 48 of 48 positions shown · IV contrast (multihance)
Comparison: [DATE]

CLINICAL DATA: Multiple sclerosis

EXAM:
MRI HEAD WITHOUT AND WITH CONTRAST
TECHNIQUE: Multiplanar, multiecho pulse sequences of the brain and surrounding
structures were obtained without and with intravenous contrast.
CONTRAST:  20mL MULTIHANCE GADOBENATE DIMEGLUMINE 529 MG/ML IV SOLN

[Series 4: T1 · sagittal · 5.0mm · 0.45mm/px · 1 of 21 slices shown]
[im 1/21]
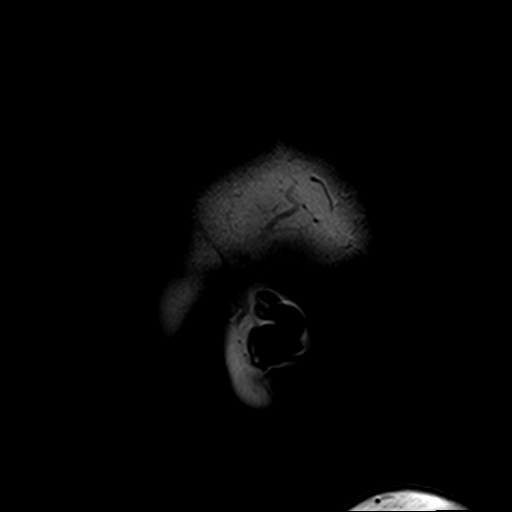

[Series 5: DWI · axial · 3.0mm · 1.80mm/px · z∈[-57,+89]mm · 7 of 100 slices shown (1 of 2)]
[im 1/100]
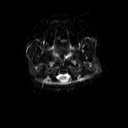
[im 17/100]
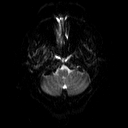
[im 34/100]
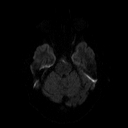
[im 50/100]
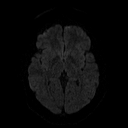
[im 67/100]
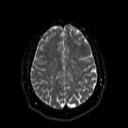
[im 83/100]
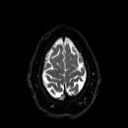
[im 100/100]
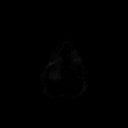

[Series 6: DWI · axial · 3.0mm · 1.80mm/px · z∈[-57,+89]mm · 4 of 50 slices shown (2 of 2)]
[im 1/50]
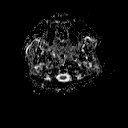
[im 17/50]
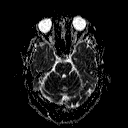
[im 33/50]
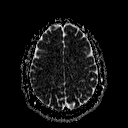
[im 50/50]
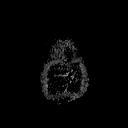

[Series 7: T2 · axial · 5.0mm · 0.51mm/px · z∈[-58,+88]mm · 2 of 22 slices shown (1 of 3)]
[im 1/22]
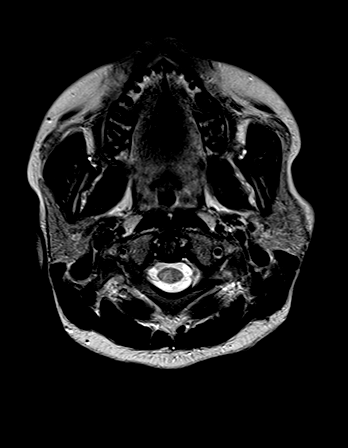
[im 22/22]
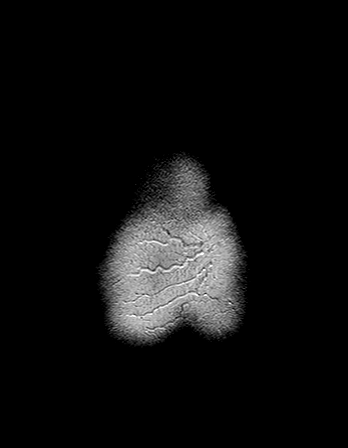

[Series 8: FLAIR · axial · 3.0mm · 0.45mm/px · z∈[-51,+83]mm · 2 of 30 slices shown (1 of 2)]
[im 1/30]
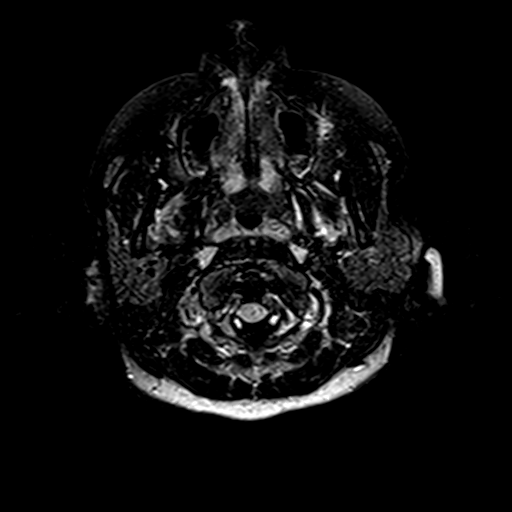
[im 30/30]
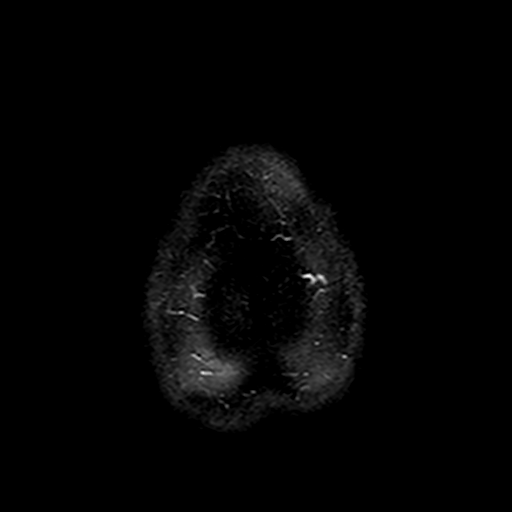

[Series 9: mip_images(sw) · axial · 32.0mm · 0.90mm/px · z∈[-40,+71]mm · 2 of 29 slices shown]
[im 1/29]
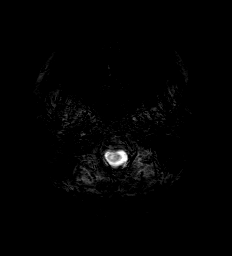
[im 29/29]
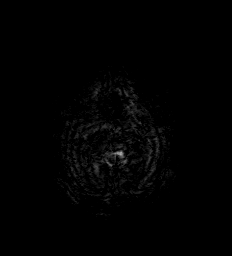

[Series 10: swi_images · axial · 4.0mm · 0.90mm/px · z∈[-54,+85]mm · 3 of 36 slices shown]
[im 1/36]
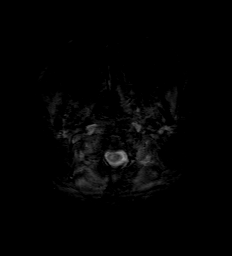
[im 18/36]
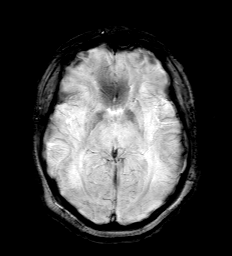
[im 36/36]
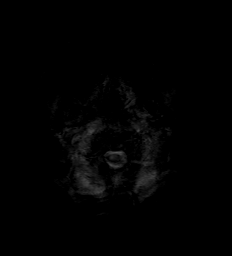

[Series 11: FLAIR · sagittal · 5.0mm · 0.45mm/px · 2 of 29 slices shown (2 of 2)]
[im 1/29]
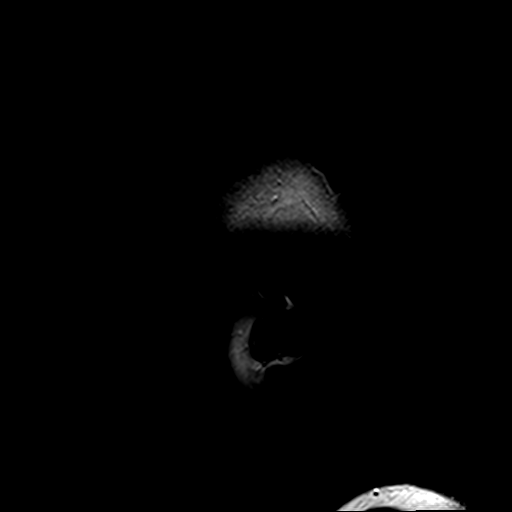
[im 29/29]
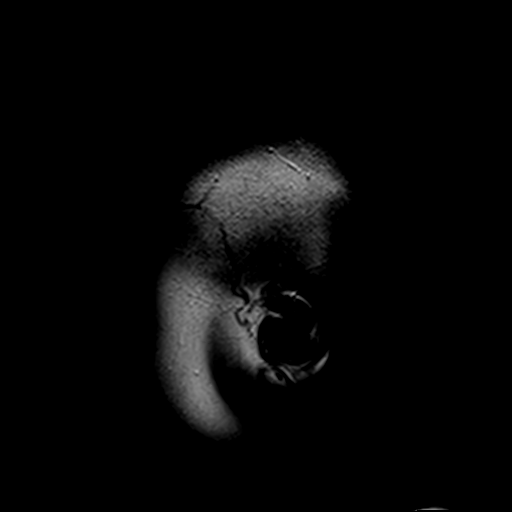

[Series 12: t1_mpr_tra · axial · 1.0mm · 0.71mm/px · z∈[-56,+86]mm · 10 of 144 slices shown]
[im 1/144]
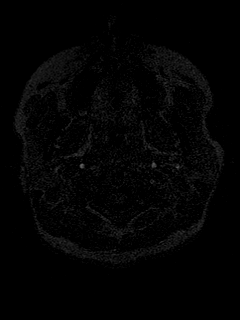
[im 16/144]
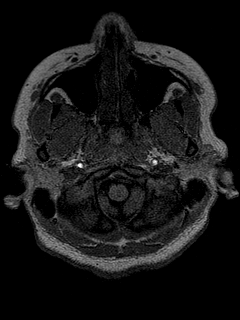
[im 32/144]
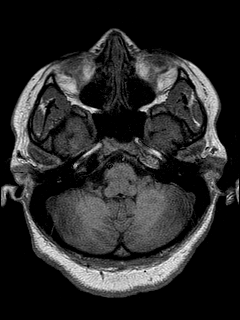
[im 48/144]
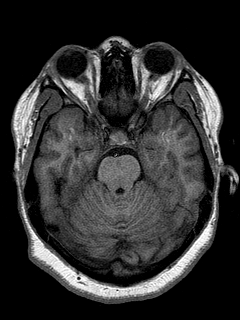
[im 64/144]
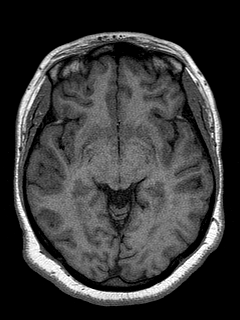
[im 80/144]
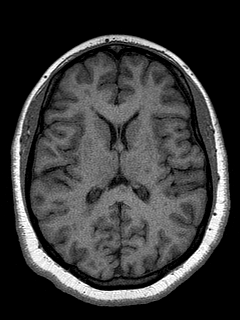
[im 96/144]
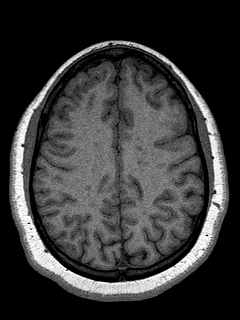
[im 112/144]
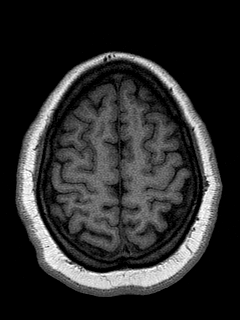
[im 128/144]
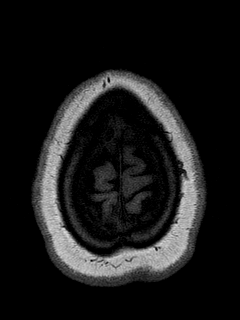
[im 144/144]
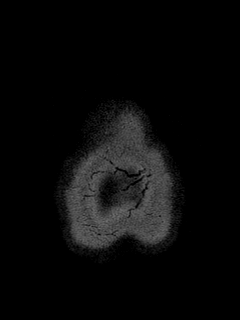

[Series 13: T2 · coronal · 5.0mm · 0.45mm/px · 1 of 14 slices shown (2 of 3)]
[im 1/14]
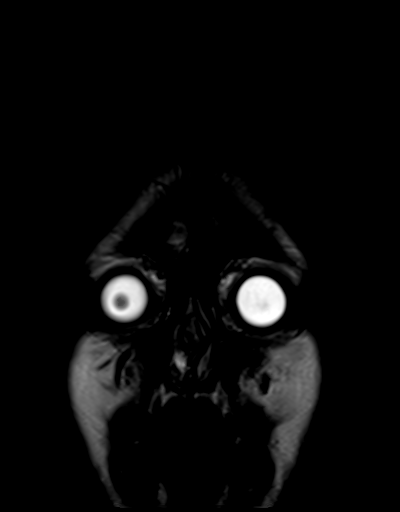

[Series 16: T2 · coronal · 5.0mm · 0.45mm/px · 2 of 28 slices shown (3 of 3)]
[im 1/28]
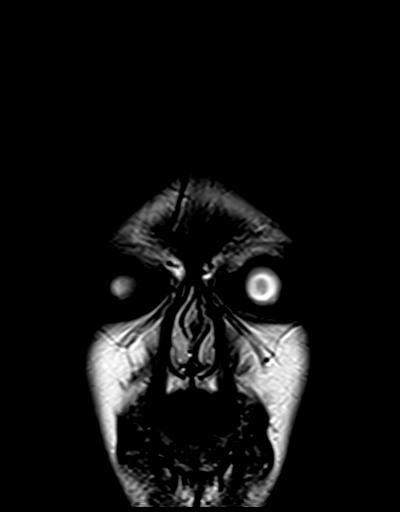
[im 28/28]
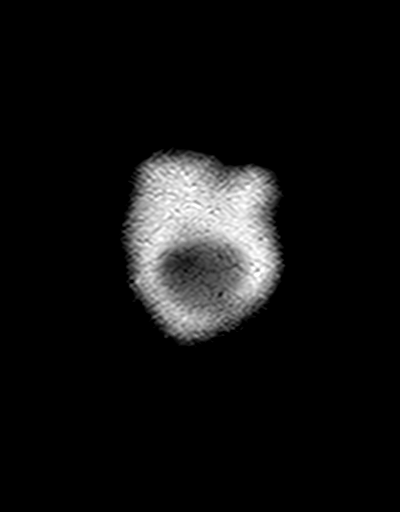

[Series 17: t1_mpr_tra post · axial · 1.0mm · 0.75mm/px · z∈[-59,+84]mm · 10 of 144 slices shown]
[im 1/144]
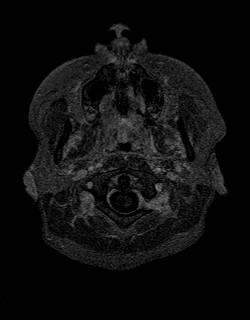
[im 16/144]
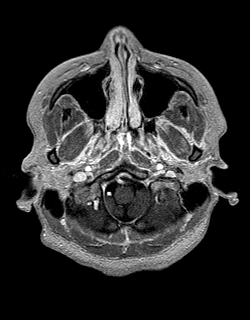
[im 32/144]
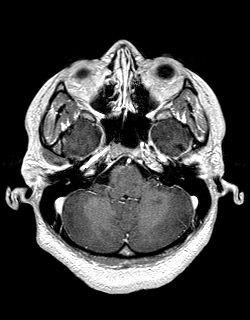
[im 48/144]
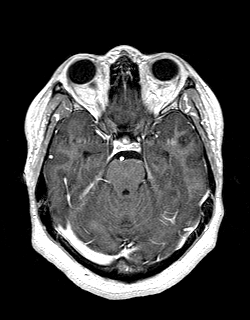
[im 64/144]
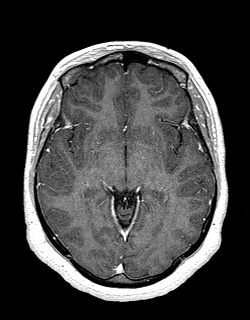
[im 80/144]
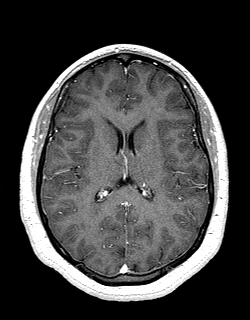
[im 96/144]
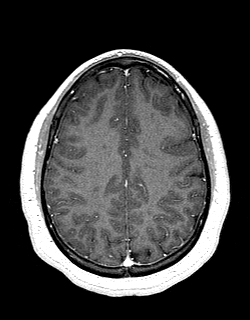
[im 112/144]
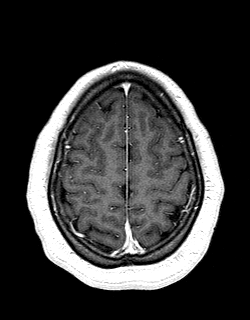
[im 128/144]
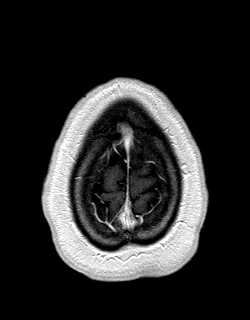
[im 144/144]
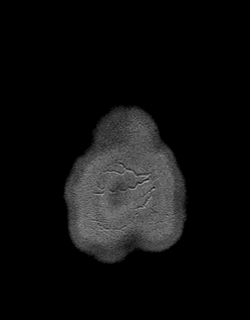

[Series 18: post cor · coronal · 5.0mm · 0.45mm/px · 2 of 28 slices shown]
[im 1/28]
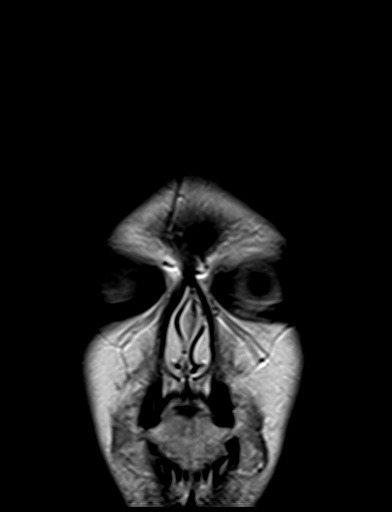
[im 28/28]
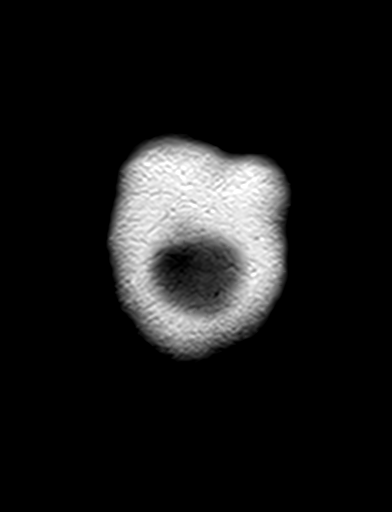

[48 of 48 positions shown; findings below may reference images not displayed]

FINDINGS: Brain: Typical multiple sclerosis pattern with numerous
periventricular, juxtacortical, and infratentorial plaques. Many of
these have regressed in conspicuity and there has been resolution of
enhancing lesions. Prior study was not specifically for
demyelinating protocol, no detected progression. Brain volume is
normal. No definite restricted diffusion. No complicating infarct,
hemorrhage, hydrocephalus, or mass.

Vascular: Normal flow voids and vascular enhancements

Skull and upper cervical spine: Normal marrow signal

Sinuses/Orbits: Negative
IMPRESSION: Stable multiple sclerosis pattern with resolution of plaque
enhancement. No new abnormality.

## 2021-04-14 MED ORDER — GADOBENATE DIMEGLUMINE 529 MG/ML IV SOLN
20.0000 mL | Freq: Once | INTRAVENOUS | Status: AC | PRN
  Administered 2021-04-14: 20 mL via INTRAVENOUS

## 2021-04-19 ENCOUNTER — Telehealth: Payer: Self-pay

## 2021-04-19 NOTE — Telephone Encounter (Signed)
-----   Message from Van Clines, MD sent at 04/16/2021  1:38 PM EST ----- Pls let her know the brain MRI looks good, no new changes. Thanks

## 2021-04-19 NOTE — Telephone Encounter (Signed)
Spoke with pt mother brain MRI looks good, no new changes

## 2021-04-20 ENCOUNTER — Encounter: Payer: Self-pay | Admitting: Neurology

## 2021-05-07 ENCOUNTER — Other Ambulatory Visit: Payer: Self-pay | Admitting: Pharmacy Technician

## 2021-05-24 ENCOUNTER — Encounter: Payer: BC Managed Care – PPO | Admitting: Obstetrics

## 2021-06-04 ENCOUNTER — Other Ambulatory Visit (HOSPITAL_COMMUNITY)
Admission: RE | Admit: 2021-06-04 | Discharge: 2021-06-04 | Disposition: A | Discharge status: Home / Self Care | Payer: BC Managed Care – PPO | Source: Ambulatory Visit | Attending: Obstetrics and Gynecology | Admitting: Obstetrics and Gynecology

## 2021-06-04 ENCOUNTER — Ambulatory Visit (INDEPENDENT_AMBULATORY_CARE_PROVIDER_SITE_OTHER): Payer: BC Managed Care – PPO | Admitting: Obstetrics and Gynecology

## 2021-06-04 ENCOUNTER — Encounter: Payer: Self-pay | Admitting: Obstetrics and Gynecology

## 2021-06-04 ENCOUNTER — Other Ambulatory Visit: Payer: Self-pay

## 2021-06-04 VITALS — BP 141/80 | HR 73 | Ht 67.0 in | Wt 216.0 lb

## 2021-06-04 DIAGNOSIS — Z01419 Encounter for gynecological examination (general) (routine) without abnormal findings: Secondary | ICD-10-CM | POA: Insufficient documentation

## 2021-06-04 DIAGNOSIS — A5909 Other urogenital trichomoniasis: Secondary | ICD-10-CM | POA: Diagnosis not present

## 2021-06-04 DIAGNOSIS — N760 Acute vaginitis: Secondary | ICD-10-CM | POA: Insufficient documentation

## 2021-06-04 LAB — POCT URINALYSIS DIPSTICK
Bilirubin, UA: NEGATIVE
Glucose, UA: NEGATIVE
Ketones, UA: NEGATIVE
Nitrite, UA: NEGATIVE
Protein, UA: NEGATIVE
Spec Grav, UA: 1.025 (ref 1.010–1.025)
Urobilinogen, UA: 0.2 E.U./dL
pH, UA: 7.5 (ref 5.0–8.0)

## 2021-06-04 MED ORDER — POLYMYXIN B-TRIMETHOPRIM 10000-0.1 UNIT/ML-% OP SOLN: 2.0000 [drp] | OPHTHALMIC | 0 refills | Status: DC

## 2021-06-04 NOTE — Progress Notes (Signed)
Subjective:     Stephanie Cabrera is a 21 y.o. female P1 with BMI 33 and LMP 04/22/21 who is here for a comprehensive physical exam. The patient reports no problems. Patient reports a monthly period lasting 4 days. She is sexually active using natural family planning for contraception. She reports some dysuria and foul odor with urination. Patient denies any abnormal discharge. She denies pelvic pain. She is without any other complaints  Past Medical History:  Diagnosis Date   Allergy    Medical history non-contributory    Seizures (HCC)    Past Surgical History:  Procedure Laterality Date   NO PAST SURGERIES     Family History  Problem Relation Age of Onset   Diabetes Paternal Grandmother    Diabetes Paternal Grandfather    Hypertension Paternal Grandfather    Social History   Socioeconomic History   Marital status: Single    Spouse name: Not on file   Number of children: 1   Years of education: Not on file   Highest education level: Not on file  Occupational History   Not on file  Tobacco Use   Smoking status: Never   Smokeless tobacco: Never  Vaping Use   Vaping Use: Never used  Substance and Sexual Activity   Alcohol use: Never    Alcohol/week: 0.0 standard drinks   Drug use: Never   Sexual activity: Not Currently    Birth control/protection: None  Other Topics Concern   Not on file  Social History Narrative   Right Handed   One Story    Lives with mom and daughter   No Caffeine   Social Determinants of Health   Financial Resource Strain: Not on file  Food Insecurity: Not on file  Transportation Needs: Not on file  Physical Activity: Not on file  Stress: Not on file  Social Connections: Not on file  Intimate Partner Violence: Not on file   Health Maintenance  Topic Date Due   COVID-19 Vaccine (1) Never done   HPV VACCINES (1 - 2-dose series) Never done   CHLAMYDIA SCREENING  08/11/2019   PAP-Cervical Cytology Screening  Never done   PAP  SMEAR-Modifier  Never done   INFLUENZA VACCINE  Never done   TETANUS/TDAP  07/06/2028   Hepatitis C Screening  Completed   HIV Screening  Completed       Review of Systems Pertinent items noted in HPI and remainder of comprehensive ROS otherwise negative.   Objective:  Blood pressure (!) 141/80, pulse 73, height 5\' 7"  (1.702 m), weight 216 lb (98 kg), last menstrual period 04/22/2021.   GENERAL: Well-developed, well-nourished female in no acute distress.  HEENT: Normocephalic, atraumatic. Sclerae anicteric.  NECK: Supple. Normal thyroid.  LUNGS: Clear to auscultation bilaterally.  HEART: Regular rate and rhythm. BREASTS: Symmetric in size. No palpable masses or lymphadenopathy, skin changes, or nipple drainage. ABDOMEN: Soft, nontender, nondistended. No organomegaly. PELVIC: Normal external female genitalia. Vagina is pink and rugated.  Normal discharge. Normal appearing cervix. Uterus is normal in size. No adnexal mass or tenderness. Chaperone present during the pelvic exam EXTREMITIES: No cyanosis, clubbing, or edema, 2+ distal pulses.     Assessment:    Healthy female exam.      Plan:    Pap smear collected STI screening per patient request Urine culture  Patient will be contacted with abnormal results See After Visit Summary for Counseling Recommendations

## 2021-06-04 NOTE — Progress Notes (Signed)
Annual, first pap Requests STI testing: vag swab and lab work Reports UTI symptoms x 1 week.

## 2021-06-05 ENCOUNTER — Telehealth (INDEPENDENT_AMBULATORY_CARE_PROVIDER_SITE_OTHER): Payer: BC Managed Care – PPO | Admitting: Family Medicine

## 2021-06-05 ENCOUNTER — Telehealth: Payer: Self-pay

## 2021-06-05 ENCOUNTER — Encounter: Payer: Self-pay | Admitting: Family Medicine

## 2021-06-05 VITALS — Ht 67.0 in | Wt 216.0 lb

## 2021-06-05 DIAGNOSIS — H5789 Other specified disorders of eye and adnexa: Secondary | ICD-10-CM | POA: Diagnosis not present

## 2021-06-05 LAB — CERVICOVAGINAL ANCILLARY ONLY
Bacterial Vaginitis (gardnerella): POSITIVE — AB
Candida Glabrata: NEGATIVE
Candida Vaginitis: NEGATIVE
Chlamydia: NEGATIVE
Comment: NEGATIVE
Comment: NEGATIVE
Comment: NEGATIVE
Comment: NEGATIVE
Comment: NEGATIVE
Comment: NORMAL
Neisseria Gonorrhea: NEGATIVE
Trichomonas: POSITIVE — AB

## 2021-06-05 LAB — HEPATITIS C ANTIBODY: Hep C Virus Ab: NONREACTIVE

## 2021-06-05 LAB — HEPATITIS B SURFACE ANTIGEN: Hepatitis B Surface Ag: NEGATIVE

## 2021-06-05 LAB — RPR: RPR Ser Ql: NONREACTIVE

## 2021-06-05 LAB — HIV ANTIBODY (ROUTINE TESTING W REFLEX): HIV Screen 4th Generation wRfx: NONREACTIVE

## 2021-06-05 MED ORDER — METRONIDAZOLE 500 MG PO TABS: 500.0000 mg | ORAL_TABLET | Freq: Two times a day (BID) | ORAL | 0 refills | Status: DC

## 2021-06-05 MED ORDER — ERYTHROMYCIN 5 MG/GM OP OINT: 1.0000 "application " | TOPICAL_OINTMENT | Freq: Three times a day (TID) | OPHTHALMIC | 0 refills | Status: DC

## 2021-06-05 NOTE — Telephone Encounter (Signed)
S/w patient and advised of results, rx, and treatment plan.

## 2021-06-05 NOTE — Progress Notes (Signed)
Virtual Visit via Video Note  I connected with Stephanie Cabrera  on 06/05/21 at  3:20 PM EST by a video enabled telemedicine application and verified that I am speaking with the correct person using two identifiers.  Location patient: [place] Location provider:work or home office Persons participating in the virtual visit: patient, provider  I discussed the limitations and requested verbal permission for telemedicine visit. The patient expressed understanding and agreed to proceed.   HPI:  Acute telemedicine visit for "Pink eye": -Onset: 2-3 days ago -Symptoms include: irritated, itchy L eye with some much in the corners of the eys -daughter and sister had pink eye recently and required abx -Denies: vision changes, HA, fevers, trauma or foreign body in eye to her knowledge -Pertinent past medical history: see below -Pertinent medication allergies:No Known Allergies -COVID-19 vaccine status:  Immunization History  Administered Date(s) Administered   DTaP 07/06/1999, 10/05/2000, 06/16/2003, 12/19/2003   DTaP / HiB 07/06/1999, 09/08/2000   Hepatitis B, ped/adol 12/01/1998, 01/22/1999, 07/06/1999   IPV 07/06/1999, 09/08/2000, 06/16/2003   MMR 09/08/2000, 06/16/2003   Tdap 07/07/2018   Varicella 06/16/2003     ROS: See pertinent positives and negatives per HPI.  Past Medical History:  Diagnosis Date   Allergy    Medical history non-contributory    Multiple sclerosis ([place]) 2021   Seizures ([place])     Past Surgical History:  Procedure Laterality Date   NO PAST SURGERIES       Current Outpatient Medications:    dicyclomine (BENTYL) 20 MG tablet, Take 1 tablet (20 mg total) by mouth 2 (two) times daily., Disp: 20 tablet, Rfl: 0   erythromycin ophthalmic ointment, Place 1 application into the left eye 3 (three) times daily., Disp: 3.5 g, Rfl: 0   Oxcarbazepine (TRILEPTAL) 300 MG tablet, Take 300 mg by mouth daily., Disp: , Rfl:    trimethoprim-polymyxin b (POLYTRIM) ophthalmic solution,  Place 2 drops into the left eye every 4 (four) hours., Disp: 10 mL, Rfl: 0   Vitamin D, Ergocalciferol, (DRISDOL) 1.25 MG (50000 UNIT) CAPS capsule, Take 1 capsule once a week for 8 weeks, Disp: 8 capsule, Rfl: 0  EXAM:  VITALS per patient if applicable:  GENERAL: alert, oriented, appears well and in no acute distress  HEENT: atraumatic, conjunttiva with some mild erythema on the L and some clear drainage at this time - she reports she has had some purulent drainage, no obvious abnormalities on inspection of external nose and ears  NECK: normal movements of the head and neck  LUNGS: on inspection no signs of respiratory distress, breathing rate appears normal, no obvious gross SOB, gasping or wheezing  CV: no obvious cyanosis  MS: moves all visible extremities without noticeable abnormality  PSYCH/NEURO: pleasant and cooperative, no obvious depression or anxiety, speech and thought processing grossly intact  ASSESSMENT AND PLAN:  Discussed the following assessment and plan:  Eye irritation  Eye drainage  -we discussed possible serious and likely etiologies, options for evaluation and workup, limitations of telemedicine visit vs in person visit, treatment, treatment risks and precautions. Pt is agreeable to treatment via telemedicine at this moment. Query conjunctivitis. Discussed possibility to viral vs bacterial conjunctivitis vs other. She prefers to try treatment with compresses and initiation of erythro topical antibiotic prescription.   Advise to seek prompt virtual visit or in person care if worsening, new symptoms arise, or if is not improving with treatment as expected per our conversation of expected course.  I discussed the assessment and treatment plan with  the patient. The patient was provided an opportunity to ask questions and all were answered. The patient agreed with the plan and demonstrated an understanding of the instructions.     Lucretia Kern, DO

## 2021-06-05 NOTE — Patient Instructions (Signed)
-  I sent the medication(s) we discussed to your pharmacy: Meds ordered this encounter  Medications   erythromycin ophthalmic ointment    Sig: Place 1 application into the left eye 3 (three) times daily.    Dispense:  3.5 g    Refill:  0     I hope you are feeling better soon!  Seek in person care promptly if your symptoms worsen, new concerns arise or you are not improving with treatment.  It was nice to meet you today. I help [place] [facility] out with telemedicine visits on Tuesdays and Thursdays and am happy to help if you need a virtual follow up visit on those days. Otherwise, if you have any concerns or questions following this visit please schedule a follow up visit with your Primary Care office or seek care at a local urgent care clinic to avoid delays in care

## 2021-06-05 NOTE — Addendum Note (Signed)
Addended by: Catalina Antigua on: 06/05/2021 03:30 PM   Modules accepted: Orders

## 2021-06-07 LAB — CYTOLOGY - PAP: Diagnosis: NEGATIVE

## 2021-06-09 LAB — URINE CULTURE

## 2021-06-12 ENCOUNTER — Other Ambulatory Visit: Payer: Self-pay | Admitting: Obstetrics and Gynecology

## 2021-06-12 MED ORDER — CEPHALEXIN 500 MG PO CAPS: 500.0000 mg | ORAL_CAPSULE | Freq: Four times a day (QID) | ORAL | 2 refills | Status: DC

## 2021-06-13 ENCOUNTER — Telehealth: Payer: Self-pay

## 2021-06-13 NOTE — Telephone Encounter (Signed)
S/w pt and advised of results and rx sent. 

## 2021-07-10 ENCOUNTER — Encounter: Payer: Self-pay | Admitting: Obstetrics and Gynecology

## 2021-07-23 ENCOUNTER — Ambulatory Visit (INDEPENDENT_AMBULATORY_CARE_PROVIDER_SITE_OTHER): Payer: BC Managed Care – PPO

## 2021-07-23 VITALS — BP 108/66 | HR 70 | Temp 98.3°F | Resp 18 | Ht 67.0 in | Wt 221.8 lb

## 2021-07-23 DIAGNOSIS — G35 Multiple sclerosis: Secondary | ICD-10-CM | POA: Diagnosis not present

## 2021-07-23 MED ORDER — METHYLPREDNISOLONE SODIUM SUCC 125 MG IJ SOLR
125.0000 mg | Freq: Once | INTRAMUSCULAR | Status: AC
  Administered 2021-07-23: 125 mg via INTRAVENOUS
  Filled 2021-07-23: qty 2

## 2021-07-23 MED ORDER — SODIUM CHLORIDE 0.9 % IV SOLN
600.0000 mg | Freq: Once | INTRAVENOUS | Status: AC
  Administered 2021-07-23: 600 mg via INTRAVENOUS
  Filled 2021-07-23: qty 20

## 2021-07-23 MED ORDER — ACETAMINOPHEN 325 MG PO TABS
650.0000 mg | ORAL_TABLET | Freq: Once | ORAL | Status: AC
  Administered 2021-07-23: 650 mg via ORAL
  Filled 2021-07-23: qty 2

## 2021-07-23 MED ORDER — DIPHENHYDRAMINE HCL 25 MG PO CAPS
50.0000 mg | ORAL_CAPSULE | Freq: Once | ORAL | Status: AC
  Administered 2021-07-23: 50 mg via ORAL
  Filled 2021-07-23: qty 2

## 2021-07-23 NOTE — Progress Notes (Signed)
Diagnosis: Multiple Sclerosis ? ?Provider:  Chilton Greathouse, MD ? ?Procedure: Infusion ? ?IV Type: Peripheral, IV Location: L Hand ? ?Ocrevus (Ocrelizumab),  Dose: 600 ? ?Infusion Start Time: [phone] ? ?Infusion Stop Time: 1319 ? ?Post Infusion IV Care: Observation period completed and Peripheral IV Discontinued ? ?Discharge: Condition: Good, Destination: Home . AVS provided to patient.  ? ?Performed by:  Adriana Mccallum, RN  ?  ?

## 2021-07-27 ENCOUNTER — Ambulatory Visit: Payer: BC Managed Care – PPO | Admitting: Neurology

## 2021-07-30 ENCOUNTER — Ambulatory Visit: Payer: BC Managed Care – PPO

## 2021-08-02 ENCOUNTER — Ambulatory Visit: Payer: BC Managed Care – PPO

## 2021-08-07 ENCOUNTER — Ambulatory Visit (INDEPENDENT_AMBULATORY_CARE_PROVIDER_SITE_OTHER): Payer: BC Managed Care – PPO

## 2021-08-07 ENCOUNTER — Other Ambulatory Visit (HOSPITAL_COMMUNITY)
Admission: RE | Admit: 2021-08-07 | Discharge: 2021-08-07 | Disposition: A | Discharge status: Home / Self Care | Payer: Managed Care, Other (non HMO) | Source: Ambulatory Visit | Attending: Obstetrics and Gynecology | Admitting: Obstetrics and Gynecology

## 2021-08-07 DIAGNOSIS — A599 Trichomoniasis, unspecified: Secondary | ICD-10-CM | POA: Diagnosis not present

## 2021-08-07 DIAGNOSIS — N760 Acute vaginitis: Secondary | ICD-10-CM | POA: Insufficient documentation

## 2021-08-07 DIAGNOSIS — N898 Other specified noninflammatory disorders of vagina: Secondary | ICD-10-CM

## 2021-08-07 MED ORDER — METRONIDAZOLE 500 MG PO TABS: 500.0000 mg | ORAL_TABLET | Freq: Two times a day (BID) | ORAL | 0 refills | Status: DC

## 2021-08-07 NOTE — Progress Notes (Cosign Needed)
SUBJECTIVE:  ?21 y.o. female complains of cloudy vaginal discharge with odor for about 2-3 week(s). Denies having any vaginal irritation. Patient was treated for trich and bv on 06/05/21. Patient states that she completed her treatment, but her partner did not get treated. Patient states that she has been sexual active with this partner since being treated. Patient informed that she will be retreated today and that her partner will also need to be treated either at his primary care or the health department. Patient advised to abstain from sexual intercourse for at least a week after she and her partner has completed treatment to prevent recurrent infections. Patient request to be checked for all STDs including yeast and bv. Self Swab completed. Patient informed that results may take 24-48 hours to return and we will contact her with abnormal results. Patient verbalized understanding. ? ?Denies abnormal vaginal bleeding or significant pelvic pain or ?fever. No UTI symptoms. Denies history of known exposure to STD. ? ?No LMP recorded. ? ?OBJECTIVE:  ?She appears well, afebrile. ?Urine dipstick: not done. ? ?ASSESSMENT:  ?Vaginal Discharge  ?Vaginal Odor ? ? ?PLAN:  ?GC, chlamydia, trichomonas, BVAG, CVAG probe sent to lab. ?Treatment: Metronidazole sent to the pharmacy for trich re-treatment.  ?

## 2021-08-07 NOTE — Progress Notes (Signed)
Patient was assessed and managed by nursing staff during this encounter. I have reviewed the chart and agree with the documentation and plan. I have also made any necessary editorial changes. ? ?Griffin Basil, MD ?08/07/2021 12:48 PM   ?

## 2021-08-08 ENCOUNTER — Ambulatory Visit (INDEPENDENT_AMBULATORY_CARE_PROVIDER_SITE_OTHER): Payer: BC Managed Care – PPO | Admitting: Family Medicine

## 2021-08-08 ENCOUNTER — Encounter: Payer: Self-pay | Admitting: Family Medicine

## 2021-08-08 VITALS — BP 110/80 | Temp 98.7°F | Wt 223.0 lb

## 2021-08-08 DIAGNOSIS — R0981 Nasal congestion: Secondary | ICD-10-CM

## 2021-08-08 DIAGNOSIS — J029 Acute pharyngitis, unspecified: Secondary | ICD-10-CM | POA: Diagnosis not present

## 2021-08-08 DIAGNOSIS — J40 Bronchitis, not specified as acute or chronic: Secondary | ICD-10-CM

## 2021-08-08 DIAGNOSIS — R051 Acute cough: Secondary | ICD-10-CM | POA: Diagnosis not present

## 2021-08-08 LAB — CERVICOVAGINAL ANCILLARY ONLY
Bacterial Vaginitis (gardnerella): POSITIVE — AB
Candida Glabrata: NEGATIVE
Candida Vaginitis: NEGATIVE
Chlamydia: NEGATIVE
Comment: NEGATIVE
Comment: NEGATIVE
Comment: NEGATIVE
Comment: NEGATIVE
Comment: NEGATIVE
Comment: NORMAL
Neisseria Gonorrhea: NEGATIVE
Trichomonas: NEGATIVE

## 2021-08-08 LAB — POCT RAPID STREP A (OFFICE): Rapid Strep A Screen: NEGATIVE

## 2021-08-08 LAB — POC COVID19 BINAXNOW: SARS Coronavirus 2 Ag: NEGATIVE

## 2021-08-08 MED ORDER — AZITHROMYCIN 250 MG PO TABS: ORAL_TABLET | ORAL | 0 refills | Status: DC

## 2021-08-08 NOTE — Progress Notes (Signed)
? ?  Subjective:  ? ? Patient ID: Stephanie Cabrera, female    DOB: 2000/07/09, 21 y.o.   MRN: [id number] ? ?HPI ?Here for 3 days of stuffy head, PND, chest tightness and coughing up yellow sputum. No fever or SOB.  ? ? ?Review of Systems  ?Constitutional: Negative.   ?HENT:  Positive for congestion and postnasal drip. Negative for ear pain and sore throat.   ?Eyes: Negative.   ?Respiratory:  Positive for cough. Negative for shortness of breath.   ?Cardiovascular: Negative.   ? ?   ?Objective:  ? Physical Exam ?Constitutional:   ?   Comments: Coughing frequently, her voice is hoarse   ?HENT:  ?   Right Ear: Tympanic membrane, ear canal and external ear normal.  ?   Left Ear: Tympanic membrane, ear canal and external ear normal.  ?   Nose: Nose normal.  ?   Mouth/Throat:  ?   Pharynx: Oropharynx is clear.  ?Eyes:  ?   Conjunctiva/sclera: Conjunctivae normal.  ?Pulmonary:  ?   Effort: Pulmonary effort is normal.  ?   Breath sounds: Wheezing present. No rhonchi or rales.  ?Lymphadenopathy:  ?   Cervical: No cervical adenopathy.  ?Neurological:  ?   Mental Status: She is alert.  ? ? ? ? ? ?   ?Assessment & Plan:  ?Bronchitis, treat with a Zpack. Written out of work [employer].  ?Gershon Crane, MD ? ? ?

## 2021-08-09 ENCOUNTER — Other Ambulatory Visit: Payer: Self-pay | Admitting: *Deleted

## 2021-08-09 MED ORDER — METRONIDAZOLE 500 MG PO TABS: 500.0000 mg | ORAL_TABLET | Freq: Two times a day (BID) | ORAL | 0 refills | Status: DC

## 2021-08-09 NOTE — Progress Notes (Signed)
Flagyl sent for +BV. See lab results 

## 2021-08-16 DIAGNOSIS — F2081 Schizophreniform disorder: Secondary | ICD-10-CM | POA: Diagnosis not present

## 2021-09-01 ENCOUNTER — Other Ambulatory Visit: Payer: Self-pay | Admitting: Obstetrics and Gynecology

## 2021-10-03 ENCOUNTER — Ambulatory Visit: Payer: BC Managed Care – PPO

## 2021-10-03 ENCOUNTER — Ambulatory Visit (INDEPENDENT_AMBULATORY_CARE_PROVIDER_SITE_OTHER): Payer: BC Managed Care – PPO

## 2021-10-03 ENCOUNTER — Other Ambulatory Visit (HOSPITAL_COMMUNITY)
Admission: RE | Admit: 2021-10-03 | Discharge: 2021-10-03 | Disposition: A | Discharge status: Home / Self Care | Payer: Managed Care, Other (non HMO) | Source: Ambulatory Visit | Attending: Obstetrics and Gynecology | Admitting: Obstetrics and Gynecology

## 2021-10-03 DIAGNOSIS — N898 Other specified noninflammatory disorders of vagina: Secondary | ICD-10-CM | POA: Insufficient documentation

## 2021-10-03 DIAGNOSIS — B3731 Acute candidiasis of vulva and vagina: Secondary | ICD-10-CM | POA: Insufficient documentation

## 2021-10-03 DIAGNOSIS — A5901 Trichomonal vulvovaginitis: Secondary | ICD-10-CM | POA: Insufficient documentation

## 2021-10-03 DIAGNOSIS — B9689 Other specified bacterial agents as the cause of diseases classified elsewhere: Secondary | ICD-10-CM | POA: Diagnosis not present

## 2021-10-03 NOTE — Progress Notes (Signed)
..  SUBJECTIVE:  21 y.o. female complains of vaginal discharge, odor, and itching for 1-2 week(s). Denies abnormal vaginal bleeding or significant pelvic pain or fever. No UTI symptoms. Denies history of known exposure to STD.  No LMP recorded.  OBJECTIVE:  She appears well, afebrile. Urine dipstick: not done.  ASSESSMENT:  Vaginal Discharge  Vaginal Odor Vaginal Itching   PLAN:  GC, chlamydia, trichomonas, BVAG, CVAG probe sent to lab. Treatment: To be determined once lab results are received ROV prn if symptoms persist or worsen.

## 2021-10-04 LAB — CERVICOVAGINAL ANCILLARY ONLY
Bacterial Vaginitis (gardnerella): POSITIVE — AB
Candida Glabrata: NEGATIVE
Candida Vaginitis: POSITIVE — AB
Chlamydia: NEGATIVE
Comment: NEGATIVE
Comment: NEGATIVE
Comment: NEGATIVE
Comment: NEGATIVE
Comment: NEGATIVE
Comment: NORMAL
Neisseria Gonorrhea: NEGATIVE
Trichomonas: POSITIVE — AB

## 2021-10-04 MED ORDER — METRONIDAZOLE 500 MG PO TABS: 500.0000 mg | ORAL_TABLET | Freq: Two times a day (BID) | ORAL | 0 refills | Status: DC

## 2021-10-04 MED ORDER — FLUCONAZOLE 150 MG PO TABS: 150.0000 mg | ORAL_TABLET | Freq: Once | ORAL | 0 refills | Status: AC

## 2021-10-04 NOTE — Addendum Note (Signed)
Addended by: Catalina Antigua on: 10/04/2021 09:28 PM   Modules accepted: Orders

## 2021-10-05 ENCOUNTER — Telehealth: Payer: Self-pay

## 2021-10-05 NOTE — Telephone Encounter (Signed)
Called to advise of results, no answer, left vm ?

## 2021-10-08 ENCOUNTER — Telehealth: Payer: Self-pay

## 2021-10-08 NOTE — Telephone Encounter (Signed)
-----   Message from Catalina Antigua, MD sent at 10/04/2021  9:28 PM EDT ----- Please inform patient of BV, yeast and trichomonas infection. Trichomonas is considered an STD. Her partner needs to be informed and treated. They should both abstain for 7 days following treatment. Rx e-prescribed for all

## 2021-10-08 NOTE — Telephone Encounter (Signed)
Notified Pt of results and RX, Pt sated she will pick up Rx today.  Advised her that her partner needs to be tested and treated, [facility] can [facility] the [facility], need to abstain from sexual  for up to 7 days after completion of Rx and will need to be retested in 6 weeks.

## 2021-10-10 DIAGNOSIS — F2081 Schizophreniform disorder: Secondary | ICD-10-CM | POA: Diagnosis not present

## 2021-10-22 ENCOUNTER — Other Ambulatory Visit: Payer: Self-pay | Admitting: *Deleted

## 2021-10-22 DIAGNOSIS — B3731 Acute candidiasis of vulva and vagina: Secondary | ICD-10-CM

## 2021-10-22 MED ORDER — FLUCONAZOLE 150 MG PO TABS: 150.0000 mg | ORAL_TABLET | Freq: Once | ORAL | 0 refills | Status: AC

## 2021-10-22 NOTE — Progress Notes (Signed)
Message from pt requesting RX for yeast. Was positive for BV, trich, and yeast. RX flagyl was sent previously. RX diflucan sent per protocol.

## 2021-11-09 ENCOUNTER — Ambulatory Visit (INDEPENDENT_AMBULATORY_CARE_PROVIDER_SITE_OTHER): Payer: Managed Care, Other (non HMO) | Admitting: Adult Health

## 2021-11-09 VITALS — BP 100/70 | HR 65 | Temp 99.2°F | Ht 67.0 in | Wt 239.0 lb

## 2021-11-09 DIAGNOSIS — J029 Acute pharyngitis, unspecified: Secondary | ICD-10-CM | POA: Diagnosis not present

## 2021-11-09 LAB — POCT RAPID STREP A (OFFICE): Rapid Strep A Screen: NEGATIVE

## 2021-11-09 NOTE — Progress Notes (Signed)
Subjective:    Patient ID: Stephanie Cabrera, female    DOB: 07-07-2000, 21 y.o.   MRN: [id number]  HPI 21 year old female who  has a past medical history of Allergy, Medical history non-contributory, Multiple sclerosis (HCC) (2021), and Seizures (HCC).  She is being evaluated today for an acute issue.  She reports sore throat that is painful when she swallows, eating, and with talking.  She has noticed swollen lymph node in the right side of her neck.  This lymph node is not painful.  She denies fevers, chills, fatigue, nausea, vomiting, or diarrhea   Review of Systems See HPI   Past Medical History:  Diagnosis Date   Allergy    Medical history non-contributory    Multiple sclerosis (HCC) 2021   Seizures (HCC)     Social History   Socioeconomic History   Marital status: Single    Spouse name: Not on file   Number of children: 1   Years of education: Not on file   Highest education level: GED or equivalent  Occupational History   Not on file  Tobacco Use   Smoking status: Never   Smokeless tobacco: Never  Vaping Use   Vaping Use: Never used  Substance and Sexual Activity   Alcohol use: Never    Alcohol/week: 0.0 standard drinks of alcohol   Drug use: Never   Sexual activity: Yes    Birth control/protection: None  Other Topics Concern   Not on file  Social History Narrative   Right Handed   One Story    Lives with mom and daughter   No Caffeine   Social Determinants of Health   Financial Resource Strain: Low Risk  (11/09/2021)   Overall Financial Resource Strain (CARDIA)    Difficulty of Paying Living Expenses: Not hard at all  Food Insecurity: No Food Insecurity (11/09/2021)   Hunger Vital Sign    Worried About Running Out of Food in the Last Year: Never true    Ran Out of Food in the Last Year: Never true  Transportation Needs: No Transportation Needs (11/09/2021)   PRAPARE - Administrator, Civil Service (Medical): No    Lack of  Transportation (Non-Medical): No  Physical Activity: Sufficiently Active (11/09/2021)   Exercise Vital Sign    Days of Exercise per Week: 5 days    Minutes of Exercise per Session: 30 min  Stress: Stress Concern Present (11/09/2021)   [employer] of Occupational Health - Occupational Stress Questionnaire    Feeling of Stress : To some extent  Social Connections: Unknown (11/09/2021)   Social Connection and Isolation Panel [NHANES]    Frequency of Communication with Friends and Family: Patient refused    Frequency of Social Gatherings with Friends and Family: Once a week    Attends Religious Services: More than 4 times per year    Active Member of [employer] or Organizations: No    Attends Engineer, structural: Not on file    Marital Status: Never married  Intimate Partner Violence: Not At Risk (09/03/2018)   Humiliation, Afraid, Rape, and Kick questionnaire    Fear of Current or Ex-Partner: No    Emotionally Abused: No    Physically Abused: No    Sexually Abused: No    Past Surgical History:  Procedure Laterality Date   NO PAST SURGERIES      Family History  Problem Relation Age of Onset   Diabetes Paternal Grandmother  Diabetes Paternal Grandfather    Hypertension Paternal Grandfather     No Known Allergies  Current Outpatient Medications on File Prior to Visit  Medication Sig Dispense Refill   azithromycin (ZITHROMAX Z-PAK) 250 MG tablet As directed 6 each 0   fluconazole (DIFLUCAN) 150 MG tablet Take 150 mg by mouth once.     metroNIDAZOLE (FLAGYL) 500 MG tablet Take 1 tablet (500 mg total) by mouth 2 (two) times daily. 14 tablet 0   No current facility-administered medications on file prior to visit.    BP 100/70   Pulse 65   Temp 99.2 F (37.3 C) (Oral)   Ht 5\' 7"  (1.702 m)   Wt 239 lb (108.4 kg)   SpO2 98%   BMI 37.43 kg/m       Objective:   Physical Exam Vitals and nursing note reviewed.  Constitutional:      Appearance: Normal appearance.   HENT:     Mouth/Throat:     Dentition: No dental abscesses.     Pharynx: Oropharynx is clear. Uvula midline. No oropharyngeal exudate or posterior oropharyngeal erythema.     Tonsils: No tonsillar exudate or tonsillar abscesses. 0 on the left.  Cardiovascular:     Rate and Rhythm: Normal rate and regular rhythm.     Pulses: Normal pulses.     Heart sounds: Normal heart sounds.  Pulmonary:     Effort: Pulmonary effort is normal.     Breath sounds: Normal breath sounds.  Skin:    General: Skin is warm and dry.  Neurological:     Mental Status: She is alert.  Psychiatric:        Mood and Affect: Mood normal.        Behavior: Behavior normal.        Thought Content: Thought content normal.        Judgment: Judgment normal.       Assessment & Plan:  1. Sore throat  - POC Rapid Strep A- negative  - Advised likely viral sore throat. We discussed conservative measures such as warm salt water gargles, warm fluids, and NSAIDS.  - Follow up in 4-5 days if not resolved or sooner if symptoms worsen   , NP

## 2021-11-13 ENCOUNTER — Encounter: Payer: Self-pay | Admitting: Neurology

## 2021-11-13 ENCOUNTER — Ambulatory Visit (INDEPENDENT_AMBULATORY_CARE_PROVIDER_SITE_OTHER): Payer: Managed Care, Other (non HMO) | Admitting: Neurology

## 2021-11-13 VITALS — BP 117/79 | HR 75 | Ht 67.0 in | Wt 242.0 lb

## 2021-11-13 DIAGNOSIS — G35 Multiple sclerosis: Secondary | ICD-10-CM

## 2021-11-13 DIAGNOSIS — G40009 Localization-related (focal) (partial) idiopathic epilepsy and epileptic syndromes with seizures of localized onset, not intractable, without status epilepticus: Secondary | ICD-10-CM

## 2021-11-13 NOTE — Addendum Note (Signed)
Addended by: Lenise Herald on: 11/13/2021 02:58 PM   Modules accepted: Orders

## 2021-11-13 NOTE — Progress Notes (Signed)
NEUROLOGY FOLLOW UP OFFICE NOTE  Stephanie Cabrera [id number] 19-Aug-2000  HISTORY OF PRESENT ILLNESS: I had the pleasure of seeing Stephanie Cabrera in follow-up in the neurology clinic on 11/13/2021.  The patient was last seen 8 months ago for MS and seizures. Her 23 year old daughter Stephanie Cabrera is with her today. Since her last visit, she continues to do well seizure-free since 01/2020 off oxcarbazepine since mid-2022. She denies any unresponsive episodes. Sometimes she notices she is staring but would respond when called. She denies any olfactory/gustatory hallucinations, new focal numbness/tingling/weakness, myoclonic jerks. She still has the intermittent right hand numbness (unchanged). She denies any vision changes/vision loss, no bladder dysfunction. She has noticed her balance is off, no falls. Her last Ocrevus infusion was in 07/2021 with no issues. Her last brain MRI with and without contrast done 04/2021 showed stable typical MS pattern with numerous periventricular, juxtacortical, and infratentorial plaques, many of which have regressed in conspicuity with resolution of enhancing lesions.  She feels tired, she continues to work third shift. She lives with her mother, sister, and daughter. She recently got approved for an apartment for September.    History on Initial Assessment 11/02/2019: This is a pleasant 21 year old right-handed woman with a history of anxiety presenting for evaluation of seizure-like activity that occurred on 09/02/2019. She was in her usual state of health until 09/02/19 when she recalls sitting on the commode then waking up in the ambulance. Her mother reports that she was tired and took 4 tablets of melatonin 3mg  which her mother thought was too much. She increased her water intake and was in the bathroom when she called her mother saying her right hand was going numb, then she started losing consciousness and fell off the toilet. Her mother laid her on the ground and saw her  whole body stiffening and shaking for 3 minutes. She bit her tongue. When EMS arrived, she could say her name and follow instructions, no focal weakness. She left the ER AMA. She saw her PCP on 09/15/19 and reported increassed stress worse after her baby was born in 08/2018. A week later, she was brought to the ER on 6/17 for IVC because she started destroying her TV and lamp in her bedroom. Family walked into the room and she was naked on her dresser with items around her. Family calmed her down and she indicated no memory of the event. According to IVC paperwork, this has happened 2 times previously, she had told them she has been speaking to God. She had not been sleeping well. Head CT no acute changes. 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 Per notes, she had been having auditory hallucinations for several weeks to months, however her mother states she started hallucinating after the seizure in May. She went back to [facility] on 7/8 due to recurrence of auditory hallucinations. Dose increased to 4mg  qhs yesterday. She denies any further hallucinations. She states her mood is good, she denies any depression or anxiety. Her mother reports that when the seizure occurred, her anxiety was bad. 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 She is now sleeping better.   She has had constant numbness in her hands since she gave birth a year ago, R>L. She denies any headaches, dizziness, diplopia, neck/back pain, bowel/bladder dysfunction. She has occasional episodes of a metallic  taste in her mouth for a few minutes that occur a couple of times a month. She has occasional jerks in her back. Her mother denies any staring/unresponsive episodes. She lives with her mother and 60 year old daughter Stephanie Cabrera. Her mother feels she is back to baseline and is mostly worried about her memory. Since the  seizure, she cannot remember where she puts things. Her mother administers her medication. She has not been driving. She denies any alcohol or illicit drug use.   Epilepsy Risk Factors:  Her paternal grandfather had seizures. Otherwise she had a normal birth and early development.  There is no history of febrile convulsions, CNS infections such as meningitis/encephalitis, significant traumatic brain injury, neurosurgical procedures.  Update 02/04/20: She had another seizure that occurred in her sleep at 3am last 01/12/20. Her mother heard her yell out and found her having a GTC that lasted less than a minute. She was confused after, no focal weakness. She was started on oxcarbazepine 300mg  BID. She has since also finished a 3-day course of IV Solumedrol. She presents to discuss MRI cervical and thoracic spine with and without contrast done 02/02/20. The cervical cord appears mildly expanded with diffuse heterogeneous T2 signal from C1 through C5, no abnormal enhancement. There is minimal heterogeneity in the proximal thoracic cord with inferior extension to the T3-4 disc space level, no abnormal enhancement.   Routine and 24-hour EEG in 11/2019 which were abnormal due to focal slowing over the bilateral temporal regions, right greater than left. There was also note of frontal intermittent rhythmic delta activity (FIRDA). No epileptiform discharges seen, typical events not captured.  MRI brain with and without contrast in 01/2020 showed numerous white matter lesions, many are periventricular, some are subcortical and deep white matter. There is a 10x67mm lesion in the left brachium pontis with additional smaller lesions in the brachium pontis bilaterally. There was some restricted diffusion in the left mid-frontal lobe, multiple enhancing lesions in the anterior frontal lobes bilaterally, right middle frontal lobe, right parietal periventricular white matter. Findings felt compatible with multiple  sclerosis. MRI cervical and thoracic spine with and without contrast done 02/02/20. The cervical cord appears mildly expanded with diffuse heterogeneous T2 signal from C1 through C5, no abnormal enhancement. There is minimal heterogeneity in the proximal thoracic cord with inferior extension to the T3-4 disc space level, no abnormal enhancement.    PAST MEDICAL HISTORY: Past Medical History:  Diagnosis Date   Allergy    Medical history non-contributory    Multiple sclerosis (HCC) 2021   Seizures (HCC)     MEDICATIONS: Current Outpatient Medications on File Prior to Visit  Medication Sig Dispense Refill   fluconazole (DIFLUCAN) 150 MG tablet Take 150 mg by mouth once. (Patient not taking: Reported on 11/13/2021)     metroNIDAZOLE (FLAGYL) 500 MG tablet Take 1 tablet (500 mg total) by mouth 2 (two) times daily. (Patient not taking: Reported on 11/13/2021) 14 tablet 0   No current facility-administered medications on file prior to visit.    ALLERGIES: No Known Allergies  FAMILY HISTORY: Family History  Problem Relation Age of Onset   Diabetes Paternal Grandmother    Diabetes Paternal Grandfather    Hypertension Paternal Grandfather     SOCIAL HISTORY: Social History   Socioeconomic History   Marital status: Single    Spouse name: Not on file   Number of children: 1   Years of education: Not on file   Highest education level: GED or equivalent  Occupational History   Not on file  Tobacco Use   Smoking status: Never   Smokeless tobacco: Never  Vaping Use   Vaping Use: Never used  Substance and Sexual Activity   Alcohol use: Never    Comment: occas   Drug use: Never   Sexual activity: Yes    Birth control/protection: None  Other Topics Concern   Not on file  Social History Narrative   Right Handed   One Story    Lives with mom and daughter   No Caffeine   Social Determinants of Health   Financial Resource Strain: Low Risk  (11/09/2021)   Overall Financial  Resource Strain (CARDIA)    Difficulty of Paying Living Expenses: Not hard at all  Food Insecurity: No Food Insecurity (11/09/2021)   Hunger Vital Sign    Worried About Running Out of Food in the Last Year: Never true    Ran Out of Food in the Last Year: Never true  Transportation Needs: No Transportation Needs (11/09/2021)   PRAPARE - Administrator, Civil Service (Medical): No    Lack of Transportation (Non-Medical): No  Physical Activity: Sufficiently Active (11/09/2021)   Exercise Vital Sign    Days of Exercise per Week: 5 days    Minutes of Exercise per Session: 30 min  Stress: Stress Concern Present (11/09/2021)   [employer] of Occupational Health - Occupational Stress Questionnaire    Feeling of Stress : To some extent  Social Connections: Unknown (11/09/2021)   Social Connection and Isolation Panel [NHANES]    Frequency of Communication with Friends and Family: Patient refused    Frequency of Social Gatherings with Friends and Family: Once a week    Attends Religious Services: More than 4 times per year    Active Member of [employer] or Organizations: No    Attends Engineer, structural: Not on file    Marital Status: Never married  Intimate Partner Violence: Not At Risk (09/03/2018)   Humiliation, Afraid, Rape, and Kick questionnaire    Fear of Current or Ex-Partner: No    Emotionally Abused: No    Physically Abused: No    Sexually Abused: No     PHYSICAL EXAM: Vitals:   11/13/21 1353  BP: 117/79  Pulse: 75  SpO2: 98%   General: No acute distress Head:  Normocephalic/atraumatic Skin/Extremities: No rash, no edema Neurological Exam: alert and awake. No aphasia or dysarthria. Fund of knowledge is appropriate.  Attention and concentration are normal.   Cranial nerves: Pupils equal, round. Extraocular movements intact with no nystagmus. Visual fields full.  No facial asymmetry.  Motor: Bulk and tone normal, muscle strength 5/5 throughout with no pronator  drift.   Finger to nose testing intact.  Gait narrow-based and steady, mild difficulty with tandem walk but able.    IMPRESSION: This is a pleasant 21 yo RH woman with a history of anxiety, new onset seizures in 2021, MRI brain at that time showed changes consistent with MS, confirmed by lumbar puncture. She was having auditory hallucinations, these have resolved. She self-discontinued oxcarbazepine mid-2022 with no seizure recurrence since 2021. She denies any MS flares, continue Ocrevus infusions. Check immunoglobulin level. Interval follow-up MRI brain with and without contrast will be ordered for January 2024. She is aware of [place] driving laws to stop driving after a seizure until 6 months seizure-free. Follow-up in 6 months, call for any changes.    Thank you for allowing me to participate in her  care.  Please do not hesitate to call for any questions or concerns.    Patrcia Dolly, M.D.   CC: Dr. [place]

## 2021-11-13 NOTE — Patient Instructions (Signed)
Always good to see you. Continue Ocrevus infusions. We will plan for annual brain MRI with and without contrast for January 2024. Follow-up in 6 months, call for any changes.   Seizure Precautions: 1. If medication has been prescribed for you to prevent seizures, take it exactly as directed.  Do not stop taking the medicine without talking to your doctor first, even if you have not had a seizure in a long time.   2. Avoid activities in which a seizure would cause danger to yourself or to others.  Don't operate dangerous machinery, swim alone, or climb in high or dangerous places, such as on ladders, roofs, or girders.  Do not drive unless your doctor says you may.  3. If you have any warning that you may have a seizure, lay down in a safe place where you can't hurt yourself.    4.  No driving for 6 months from last seizure, as per [facility].   Please refer to the following link on the Epilepsy Foundation of America's website for more information: http://www.epilepsyfoundation.org/answerplace/Social/driving/drivingu.cfm   5.  Maintain good sleep hygiene. Avoid alcohol.  6.  Notify your neurology if you are planning pregnancy or if you become pregnant.  7.  Contact your doctor if you have any problems that may be related to the medicine you are taking.  8.  Call 911 and bring the patient back to the ED if:        A.  The seizure lasts longer than 5 minutes.       B.  The patient doesn't awaken shortly after the seizure  C.  The patient has new problems such as difficulty seeing, speaking or moving  D.  The patient was injured during the seizure  E.  The patient has a temperature over 102 F (39C)  F.  The patient vomited and now is having trouble breathing

## 2021-11-19 ENCOUNTER — Ambulatory Visit (INDEPENDENT_AMBULATORY_CARE_PROVIDER_SITE_OTHER): Payer: BC Managed Care – PPO

## 2021-11-19 ENCOUNTER — Other Ambulatory Visit (HOSPITAL_COMMUNITY)
Admission: RE | Admit: 2021-11-19 | Discharge: 2021-11-19 | Disposition: A | Discharge status: Home / Self Care | Payer: Managed Care, Other (non HMO) | Source: Ambulatory Visit | Attending: Obstetrics and Gynecology | Admitting: Obstetrics and Gynecology

## 2021-11-19 VITALS — BP 133/80 | HR 88 | Ht 67.0 in | Wt 241.0 lb

## 2021-11-19 DIAGNOSIS — Z113 Encounter for screening for infections with a predominantly sexual mode of transmission: Secondary | ICD-10-CM | POA: Diagnosis not present

## 2021-11-19 DIAGNOSIS — B3731 Acute candidiasis of vulva and vagina: Secondary | ICD-10-CM | POA: Insufficient documentation

## 2021-11-19 NOTE — Progress Notes (Addendum)
SUBJECTIVE:  21 y.o. female presents for TOC.  Denies abnormal vaginal bleeding or significant pelvic pain or fever. No UTI symptoms. Denies history of known exposure to STD.  Patient's last menstrual period was 11/10/2021 (approximate).  OBJECTIVE:  She appears well, afebrile. Urine dipstick: not done.  ASSESSMENT:  TOC  PLAN:  GC, chlamydia, trichomonas, BVAG, CVAG probe sent to lab. Treatment: To be determined once lab results are received ROV prn if symptoms persist or worsen.

## 2021-11-20 LAB — CERVICOVAGINAL ANCILLARY ONLY
Bacterial Vaginitis (gardnerella): NEGATIVE
Candida Glabrata: NEGATIVE
Candida Vaginitis: POSITIVE — AB
Chlamydia: NEGATIVE
Comment: NEGATIVE
Comment: NEGATIVE
Comment: NEGATIVE
Comment: NEGATIVE
Comment: NEGATIVE
Comment: NORMAL
Neisseria Gonorrhea: NEGATIVE
Trichomonas: NEGATIVE

## 2021-11-21 MED ORDER — FLUCONAZOLE 150 MG PO TABS: 150.0000 mg | ORAL_TABLET | Freq: Once | ORAL | 0 refills | Status: AC

## 2021-11-21 NOTE — Addendum Note (Signed)
Addended by: Catalina Antigua on: 11/21/2021 10:43 AM   Modules accepted: Orders

## 2021-11-26 ENCOUNTER — Encounter: Payer: Self-pay | Admitting: Obstetrics and Gynecology

## 2021-12-26 ENCOUNTER — Other Ambulatory Visit (HOSPITAL_COMMUNITY): Payer: Self-pay

## 2021-12-26 ENCOUNTER — Encounter: Payer: Self-pay | Admitting: Neurology

## 2021-12-27 ENCOUNTER — Telehealth: Payer: Self-pay | Admitting: Pharmacy Technician

## 2021-12-27 NOTE — Telephone Encounter (Signed)
error 

## 2022-01-04 ENCOUNTER — Other Ambulatory Visit (HOSPITAL_COMMUNITY): Payer: Self-pay

## 2022-01-31 ENCOUNTER — Telehealth: Payer: Self-pay | Admitting: Pharmacy Technician

## 2022-01-31 NOTE — Telephone Encounter (Signed)
error 

## 2022-02-04 ENCOUNTER — Ambulatory Visit: Payer: BC Managed Care – PPO

## 2022-02-07 ENCOUNTER — Ambulatory Visit: Payer: BC Managed Care – PPO

## 2022-02-07 MED ORDER — SODIUM CHLORIDE 0.9 % IV SOLN
600.0000 mg | Freq: Once | INTRAVENOUS | Status: AC
  Filled 2022-02-07: qty 20

## 2022-02-07 MED ORDER — METHYLPREDNISOLONE SODIUM SUCC 125 MG IJ SOLR: 125.0000 mg | Freq: Once | INTRAMUSCULAR | Status: AC

## 2022-02-07 MED ORDER — ACETAMINOPHEN 325 MG PO TABS: 650.0000 mg | ORAL_TABLET | Freq: Once | ORAL | Status: AC

## 2022-02-07 MED ORDER — DIPHENHYDRAMINE HCL 25 MG PO CAPS: 50.0000 mg | ORAL_CAPSULE | Freq: Once | ORAL | Status: AC

## 2022-02-13 ENCOUNTER — Ambulatory Visit (INDEPENDENT_AMBULATORY_CARE_PROVIDER_SITE_OTHER): Payer: BC Managed Care – PPO

## 2022-02-13 ENCOUNTER — Ambulatory Visit: Payer: BC Managed Care – PPO

## 2022-02-13 VITALS — BP 112/75 | HR 76 | Temp 97.7°F | Resp 16 | Ht 67.0 in | Wt 250.6 lb

## 2022-02-13 DIAGNOSIS — G35D Multiple sclerosis, unspecified: Secondary | ICD-10-CM

## 2022-02-13 DIAGNOSIS — G35 Multiple sclerosis: Secondary | ICD-10-CM

## 2022-02-13 MED ORDER — METHYLPREDNISOLONE SODIUM SUCC 125 MG IJ SOLR
125.0000 mg | Freq: Once | INTRAMUSCULAR | Status: AC
  Administered 2022-02-13: 125 mg via INTRAVENOUS
  Filled 2022-02-13: qty 2

## 2022-02-13 MED ORDER — DIPHENHYDRAMINE HCL 25 MG PO CAPS
50.0000 mg | ORAL_CAPSULE | Freq: Once | ORAL | Status: AC
  Administered 2022-02-13: 50 mg via ORAL
  Filled 2022-02-13: qty 2

## 2022-02-13 MED ORDER — SODIUM CHLORIDE 0.9 % IV SOLN
600.0000 mg | Freq: Once | INTRAVENOUS | Status: AC
  Administered 2022-02-13: 600 mg via INTRAVENOUS
  Filled 2022-02-13: qty 20

## 2022-02-13 MED ORDER — ACETAMINOPHEN 325 MG PO TABS
650.0000 mg | ORAL_TABLET | Freq: Once | ORAL | Status: AC
  Administered 2022-02-13: 650 mg via ORAL
  Filled 2022-02-13: qty 2

## 2022-02-13 NOTE — Progress Notes (Signed)
Diagnosis: Multiple Sclerosis  Provider:  Chilton Greathouse MD  Procedure: Infusion  IV Type: Peripheral, IV Location: R Forearm  Ocrevus (Ocrelizumab), Dose: 600 mg  Infusion Start Time: 1022  Infusion Stop Time: 1430  Post Infusion IV Care: Observation period completed and Peripheral IV Discontinued  Discharge: Condition: Good, Destination: Home . AVS provided to patient.   Performed by:  Nat Math, RN

## 2022-02-18 ENCOUNTER — Ambulatory Visit (INDEPENDENT_AMBULATORY_CARE_PROVIDER_SITE_OTHER): Payer: BC Managed Care – PPO | Admitting: General Practice

## 2022-02-18 ENCOUNTER — Other Ambulatory Visit (HOSPITAL_COMMUNITY)
Admission: RE | Admit: 2022-02-18 | Discharge: 2022-02-18 | Disposition: A | Discharge status: Home / Self Care | Payer: Managed Care, Other (non HMO) | Source: Ambulatory Visit | Attending: Obstetrics and Gynecology | Admitting: Obstetrics and Gynecology

## 2022-02-18 VITALS — BP 124/78 | HR 86 | Ht 67.0 in | Wt 245.0 lb

## 2022-02-18 DIAGNOSIS — Z113 Encounter for screening for infections with a predominantly sexual mode of transmission: Secondary | ICD-10-CM | POA: Insufficient documentation

## 2022-02-18 NOTE — Progress Notes (Signed)
SUBJECTIVE:  21 y.o. female presents for STD testing.  Denies abnormal vaginal bleeding or significant pelvic pain or fever. No UTI symptoms. Denies history of known exposure to STD.  No LMP recorded.  OBJECTIVE:  She appears well, afebrile. Urine dipstick: not done.  ASSESSMENT:  Vaginal Discharge  Vaginal Odor   PLAN:  GC, chlamydia, trichomonas, BVAG, CVAG probe sent to lab. Treatment: To be determined once lab results are received ROV prn if symptoms persist or worsen.

## 2022-02-18 NOTE — Progress Notes (Signed)
This encounter was created in error - please disregard.

## 2022-02-19 LAB — CERVICOVAGINAL ANCILLARY ONLY
Bacterial Vaginitis (gardnerella): NEGATIVE
Candida Glabrata: NEGATIVE
Candida Vaginitis: POSITIVE — AB
Chlamydia: NEGATIVE
Comment: NEGATIVE
Comment: NEGATIVE
Comment: NEGATIVE
Comment: NEGATIVE
Comment: NEGATIVE
Comment: NORMAL
Neisseria Gonorrhea: NEGATIVE
Trichomonas: POSITIVE — AB

## 2022-02-19 MED ORDER — FLUCONAZOLE 150 MG PO TABS: 150.0000 mg | ORAL_TABLET | Freq: Once | ORAL | 0 refills | Status: AC

## 2022-02-19 MED ORDER — METRONIDAZOLE 500 MG PO TABS: 500.0000 mg | ORAL_TABLET | Freq: Two times a day (BID) | ORAL | 0 refills | Status: DC

## 2022-02-19 NOTE — Addendum Note (Signed)
Addended by: Catalina Antigua on: 02/19/2022 12:36 PM   Modules accepted: Orders

## 2022-02-19 NOTE — Progress Notes (Signed)
TC to pt. No answer. Left HIPAA compliant VM indicating MyChart message would be sent and providing call back number. MyChart message sent including DX, RX, and education.

## 2022-03-07 DIAGNOSIS — F2081 Schizophreniform disorder: Secondary | ICD-10-CM | POA: Diagnosis not present

## 2022-03-08 ENCOUNTER — Telehealth: Payer: Self-pay | Admitting: Pharmacy Technician

## 2022-03-08 NOTE — Telephone Encounter (Addendum)
Patient is no longer enrolled in the [employer] PAP (free drug).  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

## 2022-03-16 DIAGNOSIS — F2081 Schizophreniform disorder: Secondary | ICD-10-CM | POA: Diagnosis not present

## 2022-03-20 DIAGNOSIS — F2081 Schizophreniform disorder: Secondary | ICD-10-CM | POA: Diagnosis not present

## 2022-03-28 ENCOUNTER — Ambulatory Visit: Payer: BC Managed Care – PPO

## 2022-04-03 ENCOUNTER — Ambulatory Visit (INDEPENDENT_AMBULATORY_CARE_PROVIDER_SITE_OTHER): Payer: BC Managed Care – PPO | Admitting: General Practice

## 2022-04-03 ENCOUNTER — Other Ambulatory Visit (HOSPITAL_COMMUNITY)
Admission: RE | Admit: 2022-04-03 | Discharge: 2022-04-03 | Disposition: A | Discharge status: Home / Self Care | Payer: Managed Care, Other (non HMO) | Source: Ambulatory Visit | Attending: Obstetrics and Gynecology | Admitting: Obstetrics and Gynecology

## 2022-04-03 VITALS — BP 131/83 | HR 71 | Ht 67.0 in | Wt 249.8 lb

## 2022-04-03 DIAGNOSIS — N898 Other specified noninflammatory disorders of vagina: Secondary | ICD-10-CM | POA: Insufficient documentation

## 2022-04-03 NOTE — Progress Notes (Signed)
SUBJECTIVE:  21 y.o. female complains of white and malodorous vaginal discharge for 2 day(s). Denies abnormal vaginal bleeding or significant pelvic pain or fever. No UTI symptoms. Denies history of known exposure to STD.  No LMP recorded.  OBJECTIVE:  She appears well, afebrile. Urine dipstick: not done.  ASSESSMENT:  Vaginal Discharge  Vaginal Odor   PLAN:  GC, chlamydia, trichomonas, BVAG, CVAG probe sent to lab. Treatment: To be determined once lab results are received ROV prn if symptoms persist or worsen.

## 2022-04-04 LAB — CERVICOVAGINAL ANCILLARY ONLY
Bacterial Vaginitis (gardnerella): NEGATIVE
Candida Glabrata: NEGATIVE
Candida Vaginitis: POSITIVE — AB
Chlamydia: NEGATIVE
Comment: NEGATIVE
Comment: NEGATIVE
Comment: NEGATIVE
Comment: NEGATIVE
Comment: NEGATIVE
Comment: NORMAL
Neisseria Gonorrhea: NEGATIVE
Trichomonas: NEGATIVE

## 2022-04-05 MED ORDER — FLUCONAZOLE 150 MG PO TABS: 150.0000 mg | ORAL_TABLET | ORAL | 0 refills | Status: DC

## 2022-04-05 NOTE — Addendum Note (Signed)
Addended by: Harvie Bridge on: 04/05/2022 07:51 AM   Modules accepted: Orders

## 2022-06-07 ENCOUNTER — Encounter: Payer: Self-pay | Admitting: Neurology

## 2022-06-11 ENCOUNTER — Encounter: Payer: Self-pay | Admitting: Neurology

## 2022-06-14 ENCOUNTER — Ambulatory Visit (INDEPENDENT_AMBULATORY_CARE_PROVIDER_SITE_OTHER): Payer: Self-pay | Admitting: Neurology

## 2022-06-14 ENCOUNTER — Encounter: Payer: Self-pay | Admitting: Neurology

## 2022-06-14 ENCOUNTER — Other Ambulatory Visit (INDEPENDENT_AMBULATORY_CARE_PROVIDER_SITE_OTHER): Payer: Self-pay

## 2022-06-14 VITALS — BP 123/78 | HR 79 | Ht 66.0 in | Wt 254.2 lb

## 2022-06-14 DIAGNOSIS — G35 Multiple sclerosis: Secondary | ICD-10-CM

## 2022-06-14 DIAGNOSIS — G40009 Localization-related (focal) (partial) idiopathic epilepsy and epileptic syndromes with seizures of localized onset, not intractable, without status epilepticus: Secondary | ICD-10-CM

## 2022-06-14 LAB — CBC
HCT: 41.5 % (ref 36.0–46.0)
Hemoglobin: 14 g/dL (ref 12.0–15.0)
MCHC: 33.7 g/dL (ref 30.0–36.0)
MCV: 84.9 fl (ref 78.0–100.0)
Platelets: 307 10*3/uL (ref 150.0–400.0)
RBC: 4.89 Mil/uL (ref 3.87–5.11)
RDW: 13.1 % (ref 11.5–15.5)
WBC: 8.9 10*3/uL (ref 4.0–10.5)

## 2022-06-14 LAB — COMPREHENSIVE METABOLIC PANEL
ALT: 20 U/L (ref 0–35)
AST: 18 U/L (ref 0–37)
Albumin: 3.8 g/dL (ref 3.5–5.2)
Alkaline Phosphatase: 77 U/L (ref 39–117)
BUN: 14 mg/dL (ref 6–23)
CO2: 27 mEq/L (ref 19–32)
Calcium: 9.3 mg/dL (ref 8.4–10.5)
Chloride: 103 mEq/L (ref 96–112)
Creatinine, Ser: 0.93 mg/dL (ref 0.40–1.20)
GFR: 86.65 mL/min (ref >60.00)
Glucose, Bld: 90 mg/dL (ref 70–99)
Potassium: 3.8 mEq/L (ref 3.5–5.1)
Sodium: 137 mEq/L (ref 135–145)
Total Bilirubin: 0.4 mg/dL (ref 0.2–1.2)
Total Protein: 7.4 g/dL (ref 6.0–8.3)

## 2022-06-14 LAB — VITAMIN D 25 HYDROXY (VIT D DEFICIENCY, FRACTURES): VITD: 17.09 ng/mL — ABNORMAL LOW (ref 30.00–100.00)

## 2022-06-14 NOTE — Progress Notes (Signed)
NEUROLOGY FOLLOW UP OFFICE NOTE  Edeline Jaudon [id number]:[id number] 2000-04-17  HISTORY OF PRESENT ILLNESS: I had the pleasure of seeing Leileen Pande in follow-up in the neurology clinic on 06/14/2022.  The patient was last seen 7 months ago for MS and seizures. She is alone in the office today. Records and images were personally reviewed where available.  She is on Ocrevus infusions for MS, last infusion was in 02/2022. Her last brain MRI in 04/2021 did not show any new lesions, many had regressed in conspicuity with resolution of enhancing lesions. She is scheduled for interval brain MRI in 08/2022. She continues to deny any convulsions since 2021, off oxcarbazepine since mid-2022. She denies any staring/unresponsive episodes, gaps in time, myoclonic jerks. She has had 2-3 instances of noticing a metallic taste lasting for a whole day with no other associated symptoms. She continues to note occasional right hand numbness. She has had a few headaches, some dizziness, no vision changes. She slipped last week, no injuries. Mood is up and down depending on the situation. She is driving. She works 3rd shift from [place] to 6:30am, sleeps from 8am to 2pm.   History on Initial Assessment 11/02/2019: This is a pleasant 22 year old right-handed woman with a history of anxiety presenting for evaluation of seizure-like activity that occurred on 09/02/2019. She was in her usual state of health until 09/02/19 when she recalls sitting on the commode then waking up in the ambulance. Her mother reports that she was tired and took 4 tablets of melatonin 3mg  which her mother thought was too much. She increased her water intake and was in the bathroom when she called her mother saying her right hand was going numb, then she started losing consciousness and fell off the toilet. Her mother laid her on the ground and saw her whole body stiffening and shaking for 3 minutes. She bit her tongue. When EMS arrived, she could say her  name and follow instructions, no focal weakness. She left the ER AMA. She saw her PCP on 09/15/19 and reported increassed stress worse after her baby was born in 08/2018. A week later, she was brought to the ER on 6/17 for IVC because she started destroying her TV and lamp in her bedroom. Family walked into the room and she was naked on her dresser with items around her. Family calmed her down and she indicated no memory of the event. According to IVC paperwork, this has happened 2 times previously, she had told them she has been speaking to God. She had not been sleeping well. Head CT no acute changes. 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 Per notes, she had been having auditory hallucinations for several weeks to months, however her mother states she started hallucinating after the seizure in May. She went back to [facility] on 7/8 due to recurrence of auditory hallucinations. Dose increased to 4mg  qhs yesterday. She denies any further hallucinations. She states her mood is good, she denies any depression or anxiety. Her mother reports that when the seizure occurred, her anxiety was bad. 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 She is now sleeping better.   She has had constant numbness in her hands since she gave birth a year ago, R>L. She denies any headaches, dizziness, diplopia, neck/back pain, bowel/bladder dysfunction. She has occasional episodes of a metallic  taste in her mouth for a few minutes that occur a couple of times a month. She has occasional jerks in her back. Her mother denies any staring/unresponsive episodes. She lives with her mother and 38 year old daughter Delcie Roch. Her mother feels she is back to baseline and is mostly worried about her memory. Since the seizure, she cannot remember where she puts things. Her mother administers her medication. She has not been  driving. She denies any alcohol or illicit drug use.   Epilepsy Risk Factors:  Her paternal grandfather had seizures. Otherwise she had a normal birth and early development.  There is no history of febrile convulsions, CNS infections such as meningitis/encephalitis, significant traumatic brain injury, neurosurgical procedures.  Update 02/04/20: She had another seizure that occurred in her sleep at 3am last 01/12/20. Her mother heard her yell out and found her having a GTC that lasted less than a minute. She was confused after, no focal weakness. She was started on oxcarbazepine 300mg  BID. She has since also finished a 3-day course of IV Solumedrol. She presents to discuss MRI cervical and thoracic spine with and without contrast done 02/02/20. The cervical cord appears mildly expanded with diffuse heterogeneous T2 signal from C1 through C5, no abnormal enhancement. There is minimal heterogeneity in the proximal thoracic cord with inferior extension to the T3-4 disc space level, no abnormal enhancement.   Routine and 24-hour EEG in 11/2019 which were abnormal due to focal slowing over the bilateral temporal regions, right greater than left. There was also note of frontal intermittent rhythmic delta activity (FIRDA). No epileptiform discharges seen, typical events not captured.  MRI brain with and without contrast in 01/2020 showed numerous white matter lesions, many are periventricular, some are subcortical and deep white matter. There is a 10x75mm lesion in the left brachium pontis with additional smaller lesions in the brachium pontis bilaterally. There was some restricted diffusion in the left mid-frontal lobe, multiple enhancing lesions in the anterior frontal lobes bilaterally, right middle frontal lobe, right parietal periventricular white matter. Findings felt compatible with multiple sclerosis. MRI cervical and thoracic spine with and without contrast done 02/02/20. The cervical cord appears mildly  expanded with diffuse heterogeneous T2 signal from C1 through C5, no abnormal enhancement. There is minimal heterogeneity in the proximal thoracic cord with inferior extension to the T3-4 disc space level, no abnormal enhancement.    PAST MEDICAL HISTORY: Past Medical History:  Diagnosis Date   Allergy    Medical history non-contributory    Multiple sclerosis ([place]) 2021   Seizures ([place])     MEDICATIONS: No current outpatient medications on file prior to visit.   Current Facility-Administered Medications on File Prior to Visit  Medication Dose Route Frequency Provider Last Rate Last Admin   acetaminophen (TYLENOL) tablet 650 mg  650 mg Oral Once Cameron Sprang, MD       diphenhydrAMINE (BENADRYL) capsule 50 mg  50 mg Oral Once Cameron Sprang, MD       methylPREDNISolone sodium succinate (SOLU-MEDROL) 125 mg/2 mL injection 125 mg  125 mg Intravenous Once Cameron Sprang, MD       ocrelizumab (OCREVUS) 600 mg in sodium chloride 0.9 % 500 mL  600 mg Intravenous Once Cameron Sprang, MD        ALLERGIES: No Known Allergies  FAMILY HISTORY: Family History  Problem Relation Age of Onset   Diabetes Paternal Grandmother    Diabetes Paternal Grandfather    Hypertension Paternal Grandfather  SOCIAL HISTORY: Social History   Socioeconomic History   Marital status: Single    Spouse name: Not on file   Number of children: 1   Years of education: Not on file   Highest education level: GED or equivalent  Occupational History   Not on file  Tobacco Use   Smoking status: Never   Smokeless tobacco: Never  Vaping Use   Vaping Use: Never used  Substance and Sexual Activity   Alcohol use: Never    Comment: occas   Drug use: Never   Sexual activity: Yes    Birth control/protection: None  Other Topics Concern   Not on file  Social History Narrative   Right Handed   One Story    Lives with mom and daughter   No Caffeine   Social Determinants of Health   Financial  Resource Strain: Low Risk  (11/09/2021)   Overall Financial Resource Strain (CARDIA)    Difficulty of Paying Living Expenses: Not hard at all  Food Insecurity: No Food Insecurity (11/09/2021)   Hunger Vital Sign    Worried About Running Out of Food in the Last Year: Never true    [place] in the Last Year: Never true  Transportation Needs: No Transportation Needs (11/09/2021)   PRAPARE - Hydrologist (Medical): No    Lack of Transportation (Non-Medical): No  Physical Activity: Sufficiently Active (11/09/2021)   Exercise Vital Sign    Days of Exercise per Week: 5 days    Minutes of Exercise per Session: 30 min  Stress: Stress Concern Present (11/09/2021)   [place]    Feeling of Stress : To some extent  Social Connections: Unknown (11/09/2021)   Social Connection and Isolation Panel [NHANES]    Frequency of Communication with Friends and Family: Patient refused    Frequency of Social Gatherings with Friends and Family: Once a week    Attends Religious Services: More than 4 times per year    Active Member of [employer] or Organizations: No    Attends Music therapist: Not on file    Marital Status: Never married  Intimate Partner Violence: Not At Risk (09/03/2018)   Humiliation, Afraid, Rape, and Kick questionnaire    Fear of Current or Ex-Partner: No    Emotionally Abused: No    Physically Abused: No    Sexually Abused: No     PHYSICAL EXAM: Vitals:   06/14/22 0950  BP: 123/78  Pulse: 79  SpO2: 99%   General: No acute distress Head:  Normocephalic/atraumatic Skin/Extremities: No rash, no edema Neurological Exam: alert and awake. No aphasia or dysarthria. Fund of knowledge is appropriate. Attention and concentration are normal.   Cranial nerves: Pupils equal, round. Extraocular movements intact with no nystagmus. Visual fields full.  No facial asymmetry.  Motor: Bulk and tone  normal, muscle strength 5/5 throughout with no pronator drift.   Finger to nose testing intact.  Gait narrow-based and steady, able to tandem walk adequately.  Romberg negative.   IMPRESSION: This is a pleasant 22 yo RH woman with a history of anxiety, new onset seizures in 2021, MRI brain at that time showed changes consistent with MS, confirmed by lumbar puncture. She was having auditory hallucinations, these have resolved. She self-discontinued oxcarbazepine in 2022 with no seizure recurrence since 2021. She denies any MS flares, continue Ocrevus. Check immunoglobuin level, safety labs. She is scheduled for interval brain  MRI with and without contrast in May 2024. We discussed avoidance of seizure triggers, monitor sleep hygiene. She is aware of [place] driving laws to stop driving after a seizure until 6 months seizure-free. Follow-up in 6 months, call for any changes.   Thank you for allowing me to participate in her care.  Please do not hesitate to call for any questions or concerns.    Ellouise Newer, M.D.   CC: Dr. Martinique

## 2022-06-14 NOTE — Patient Instructions (Addendum)
Always a pleasure to see you.  Bloodwork for CBC, CMP, vitamin D, immunoglobulins  2. Schedule MRI brain with and without contrast  3. Proceed with Ocrevus infusion as scheduled in May  4. Please update our system with current insurance information  5. Follow-up in 6 months, call for any changes   Seizure Precautions: 1. If medication has been prescribed for you to prevent seizures, take it exactly as directed.  Do not stop taking the medicine without talking to your doctor first, even if you have not had a seizure in a long time.   2. Avoid activities in which a seizure would cause danger to yourself or to others.  Don't operate dangerous machinery, swim alone, or climb in high or dangerous places, such as on ladders, roofs, or girders.  Do not drive unless your doctor says you may.  3. If you have any warning that you may have a seizure, lay down in a safe place where you can't hurt yourself.    4.  No driving for 6 months from last seizure, as per [facility].   Please refer to the following link on the [place] website for more information: http://www.epilepsyfoundation.org/answerplace/Social/driving/drivingu.cfm   5.  Maintain good sleep hygiene. Avoid alcohol.  6.  Notify your neurology if you are planning pregnancy or if you become pregnant.  7.  Contact your doctor if you have any problems that may be related to the medicine you are taking.  8.  Call 911 and bring the patient back to the ED if:        A.  The seizure lasts longer than 5 minutes.       B.  The patient doesn't awaken shortly after the seizure  C.  The patient has new problems such as difficulty seeing, speaking or moving  D.  The patient was injured during the seizure  E.  The patient has a temperature over 102 F (39C)  F.  The patient vomited and now is having trouble breathing

## 2022-06-19 LAB — IMMUNOGLOBULINS A/E/G/M, SERUM
IgA/Immunoglobulin A, Serum: 359 mg/dL — ABNORMAL HIGH (ref 87–352)
IgE (Immunoglobulin E), Serum: 21 IU/mL (ref 6–495)
IgG (Immunoglobin G), Serum: 1913 mg/dL — ABNORMAL HIGH (ref 586–1602)
IgM (Immunoglobulin M), Srm: 56 mg/dL (ref 26–217)

## 2022-06-30 MED ORDER — VITAMIN D (ERGOCALCIFEROL) 1.25 MG (50000 UNIT) PO CAPS: ORAL_CAPSULE | ORAL | 0 refills | Status: DC

## 2022-07-01 ENCOUNTER — Telehealth: Payer: Self-pay

## 2022-07-02 ENCOUNTER — Telehealth: Payer: Self-pay

## 2022-07-02 MED ORDER — VITAMIN D (ERGOCALCIFEROL) 1.25 MG (50000 UNIT) PO CAPS: ORAL_CAPSULE | ORAL | 0 refills | Status: DC

## 2022-07-02 NOTE — Telephone Encounter (Signed)
-----   Message from Cameron Sprang, MD sent at 06/30/2022  6:24 PM EDT ----- Pls let her know vitamin D level is low again. Will need to take the high dose 50,000 IU once a week for 8 weeks, then after take a daily calcium with vitamin D supplement. I sent in vitamin D prescription to her pharmacy. Thanks

## 2022-07-02 NOTE — Telephone Encounter (Signed)
Pt called an informed vitamin D level is low again. Will need to take the high dose 50,000 IU once a week for 8 weeks, then after take a daily calcium with vitamin D supplement. Dr Delice Lesch sent in vitamin D prescription to her pharmacy RX sent to [facility] on [address]

## 2022-07-04 NOTE — Telephone Encounter (Signed)
Open in error

## 2022-07-09 ENCOUNTER — Telehealth: Payer: Self-pay | Admitting: Pharmacy Technician

## 2022-07-09 NOTE — Telephone Encounter (Signed)
Patient is un-insured and will need PAP (free drug) Forms has been faxed to Dr. Delice Lesch office for signature. Phone: [phone] Fax: [phone]

## 2022-07-10 NOTE — Telephone Encounter (Signed)
Looking for forms, may have to wait till Thursday for [facility]

## 2022-07-11 ENCOUNTER — Encounter: Payer: Self-pay | Admitting: Neurology

## 2022-07-11 DIAGNOSIS — Z0279 Encounter for issue of other medical certificate: Secondary | ICD-10-CM

## 2022-07-15 NOTE — Telephone Encounter (Signed)
Signed, thanks

## 2022-08-02 ENCOUNTER — Encounter: Payer: Self-pay | Admitting: Neurology

## 2022-08-13 ENCOUNTER — Ambulatory Visit: Payer: Managed Care, Other (non HMO)

## 2022-08-13 ENCOUNTER — Other Ambulatory Visit (HOSPITAL_COMMUNITY)
Admission: RE | Admit: 2022-08-13 | Discharge: 2022-08-13 | Disposition: A | Discharge status: Home / Self Care | Payer: Managed Care, Other (non HMO) | Source: Ambulatory Visit | Attending: Obstetrics and Gynecology | Admitting: Obstetrics and Gynecology

## 2022-08-13 DIAGNOSIS — N898 Other specified noninflammatory disorders of vagina: Secondary | ICD-10-CM

## 2022-08-13 DIAGNOSIS — Z113 Encounter for screening for infections with a predominantly sexual mode of transmission: Secondary | ICD-10-CM | POA: Diagnosis not present

## 2022-08-13 NOTE — Progress Notes (Signed)
SUBJECTIVE:  22 y.o. female complains of white and thick vaginal discharge with odor for 2 week(s). Denies abnormal vaginal bleeding or significant pelvic pain or fever. No UTI symptoms. Denies history of known exposure to STD.  No LMP recorded.  OBJECTIVE:  She appears well, afebrile. Urine dipstick: not done.  ASSESSMENT:  Vaginal Discharge  Vaginal Odor   PLAN:  GC, chlamydia, trichomonas, BVAG, CVAG probe sent to lab. Treatment: To be determined once lab results are received ROV prn if symptoms persist or worsen.

## 2022-08-14 LAB — CERVICOVAGINAL ANCILLARY ONLY
Bacterial Vaginitis (gardnerella): POSITIVE — AB
Candida Glabrata: NEGATIVE
Candida Vaginitis: POSITIVE — AB
Chlamydia: NEGATIVE
Comment: NEGATIVE
Comment: NEGATIVE
Comment: NEGATIVE
Comment: NEGATIVE
Comment: NEGATIVE
Comment: NORMAL
Neisseria Gonorrhea: NEGATIVE
Trichomonas: NEGATIVE

## 2022-08-14 LAB — HIV ANTIBODY (ROUTINE TESTING W REFLEX): HIV Screen 4th Generation wRfx: NONREACTIVE

## 2022-08-14 LAB — HEPATITIS B SURFACE ANTIGEN: Hepatitis B Surface Ag: NEGATIVE

## 2022-08-14 LAB — RPR: RPR Ser Ql: NONREACTIVE

## 2022-08-14 LAB — HEPATITIS C ANTIBODY: Hep C Virus Ab: NONREACTIVE

## 2022-08-15 ENCOUNTER — Other Ambulatory Visit: Payer: Self-pay | Admitting: *Deleted

## 2022-08-15 DIAGNOSIS — N760 Acute vaginitis: Secondary | ICD-10-CM

## 2022-08-15 DIAGNOSIS — B3731 Acute candidiasis of vulva and vagina: Secondary | ICD-10-CM

## 2022-08-15 MED ORDER — FLUCONAZOLE 150 MG PO TABS
150.0000 mg | ORAL_TABLET | Freq: Once | ORAL | 0 refills | Status: AC
Start: 2022-08-15 — End: 2022-08-15

## 2022-08-15 MED ORDER — METRONIDAZOLE 500 MG PO TABS: 500.0000 mg | ORAL_TABLET | Freq: Two times a day (BID) | ORAL | 0 refills | Status: DC

## 2022-08-15 NOTE — Progress Notes (Signed)
Pt is active in [facility]. Message sent via [facility] advising of DX's and RX's sent. Pt education on infections included in message. RX Flagyl and Diflucan sent per protocol.

## 2022-08-16 ENCOUNTER — Ambulatory Visit (INDEPENDENT_AMBULATORY_CARE_PROVIDER_SITE_OTHER): Payer: Managed Care, Other (non HMO)

## 2022-08-16 VITALS — BP 106/72 | HR 82 | Temp 98.6°F | Resp 16 | Ht 67.0 in | Wt 255.0 lb

## 2022-08-16 DIAGNOSIS — G40009 Localization-related (focal) (partial) idiopathic epilepsy and epileptic syndromes with seizures of localized onset, not intractable, without status epilepticus: Secondary | ICD-10-CM

## 2022-08-16 DIAGNOSIS — G35 Multiple sclerosis: Secondary | ICD-10-CM | POA: Diagnosis not present

## 2022-08-16 MED ORDER — DIPHENHYDRAMINE HCL 25 MG PO CAPS
50.0000 mg | ORAL_CAPSULE | Freq: Once | ORAL | Status: AC
  Administered 2022-08-16: 50 mg via ORAL
  Filled 2022-08-16: qty 2

## 2022-08-16 MED ORDER — SODIUM CHLORIDE 0.9 % IV SOLN
600.0000 mg | Freq: Once | INTRAVENOUS | Status: AC
  Administered 2022-08-16: 600 mg via INTRAVENOUS
  Filled 2022-08-16: qty 20

## 2022-08-16 MED ORDER — METHYLPREDNISOLONE SODIUM SUCC 125 MG IJ SOLR
125.0000 mg | Freq: Once | INTRAMUSCULAR | Status: AC
  Administered 2022-08-16: 125 mg via INTRAVENOUS
  Filled 2022-08-16: qty 2

## 2022-08-16 MED ORDER — ACETAMINOPHEN 325 MG PO TABS
650.0000 mg | ORAL_TABLET | Freq: Once | ORAL | Status: AC
  Administered 2022-08-16: 650 mg via ORAL
  Filled 2022-08-16: qty 2

## 2022-08-16 NOTE — Progress Notes (Signed)
Diagnosis: IV Infusion  Provider:  Chilton Greathouse MD  Procedure: IV Infusion  IV Type: Peripheral, IV Location: L Forearm  Ocrevus (Ocrelizumab), Dose: 600 mg  Infusion Start Time: 0925  Infusion Stop Time: 1342  Post Infusion IV Care: Observation period completed and Peripheral IV Discontinued  Discharge: Condition: Good, Destination: Home . AVS Declined  Performed by:  Nat Math, RN

## 2022-09-06 ENCOUNTER — Ambulatory Visit: Payer: Managed Care, Other (non HMO) | Admitting: Family Medicine

## 2022-09-27 ENCOUNTER — Encounter: Payer: Self-pay | Admitting: Neurology

## 2022-10-24 ENCOUNTER — Other Ambulatory Visit (HOSPITAL_COMMUNITY)
Admission: RE | Admit: 2022-10-24 | Discharge: 2022-10-24 | Disposition: A | Discharge status: Home / Self Care | Payer: Managed Care, Other (non HMO) | Source: Ambulatory Visit | Attending: Student | Admitting: Student

## 2022-10-24 ENCOUNTER — Ambulatory Visit (INDEPENDENT_AMBULATORY_CARE_PROVIDER_SITE_OTHER): Payer: Managed Care, Other (non HMO)

## 2022-10-24 VITALS — BP 120/74 | HR 80 | Ht 67.0 in | Wt 254.3 lb

## 2022-10-24 DIAGNOSIS — Z113 Encounter for screening for infections with a predominantly sexual mode of transmission: Secondary | ICD-10-CM

## 2022-10-24 DIAGNOSIS — N760 Acute vaginitis: Secondary | ICD-10-CM | POA: Diagnosis present

## 2022-10-24 NOTE — Progress Notes (Signed)
SUBJECTIVE:  22 y.o. female complains of vaginitis for 7 days. Denies abnormal vaginal bleeding or significant pelvic pain or fever. No UTI symptoms. Denies history of known exposure to STD.  No LMP recorded.  OBJECTIVE:  She appears well, afebrile. Urine dipstick: not done.  ASSESSMENT:  Vaginitis Screening for STIs   PLAN:  GC, chlamydia, trichomonas, BVAG, CVAG probe sent to lab. Treatment: To be determined once lab results are received ROV prn if symptoms persist or worsen.

## 2022-10-25 LAB — CERVICOVAGINAL ANCILLARY ONLY
Bacterial Vaginitis (gardnerella): NEGATIVE
Candida Glabrata: NEGATIVE
Candida Vaginitis: POSITIVE — AB
Chlamydia: NEGATIVE
Comment: NEGATIVE
Comment: NEGATIVE
Comment: NEGATIVE
Comment: NEGATIVE
Comment: NEGATIVE
Comment: NORMAL
Neisseria Gonorrhea: NEGATIVE
Trichomonas: NEGATIVE

## 2022-10-30 ENCOUNTER — Other Ambulatory Visit: Payer: Self-pay | Admitting: Obstetrics & Gynecology

## 2022-10-30 DIAGNOSIS — N760 Acute vaginitis: Secondary | ICD-10-CM

## 2022-10-30 MED ORDER — FLUCONAZOLE 150 MG PO TABS
150.0000 mg | ORAL_TABLET | Freq: Once | ORAL | 0 refills | Status: AC
Start: 2022-10-30 — End: 2022-10-30

## 2022-10-30 NOTE — Progress Notes (Signed)
Meds ordered this encounter  Medications   fluconazole (DIFLUCAN) 150 MG tablet    Sig: Take 1 tablet (150 mg total) by mouth once for 1 dose.    Dispense:  1 tablet    Refill:  0

## 2022-11-18 ENCOUNTER — Encounter (HOSPITAL_COMMUNITY): Payer: Self-pay | Admitting: *Deleted

## 2022-11-18 ENCOUNTER — Inpatient Hospital Stay (HOSPITAL_COMMUNITY): Payer: Managed Care, Other (non HMO)

## 2022-11-18 ENCOUNTER — Inpatient Hospital Stay (HOSPITAL_COMMUNITY)
Admission: AD | Admit: 2022-11-18 | Discharge: 2022-11-18 | Disposition: A | Discharge status: Home / Self Care | Payer: Managed Care, Other (non HMO) | Attending: Obstetrics and Gynecology | Admitting: Obstetrics and Gynecology

## 2022-11-18 DIAGNOSIS — O200 Threatened abortion: Secondary | ICD-10-CM | POA: Insufficient documentation

## 2022-11-18 DIAGNOSIS — N939 Abnormal uterine and vaginal bleeding, unspecified: Secondary | ICD-10-CM

## 2022-11-18 DIAGNOSIS — R109 Unspecified abdominal pain: Secondary | ICD-10-CM

## 2022-11-18 DIAGNOSIS — Z3A01 Less than 8 weeks gestation of pregnancy: Secondary | ICD-10-CM | POA: Diagnosis not present

## 2022-11-18 DIAGNOSIS — R1030 Lower abdominal pain, unspecified: Secondary | ICD-10-CM | POA: Diagnosis present

## 2022-11-18 DIAGNOSIS — O26851 Spotting complicating pregnancy, first trimester: Secondary | ICD-10-CM | POA: Diagnosis present

## 2022-11-18 DIAGNOSIS — O26891 Other specified pregnancy related conditions, first trimester: Secondary | ICD-10-CM | POA: Diagnosis present

## 2022-11-18 LAB — CBC
HCT: 44.5 % (ref 36.0–46.0)
Hemoglobin: 14.9 g/dL (ref 12.0–15.0)
MCH: 28.7 pg (ref 26.0–34.0)
MCHC: 33.5 g/dL (ref 30.0–36.0)
MCV: 85.6 fL (ref 80.0–100.0)
Platelets: 322 10*3/uL (ref 150–400)
RBC: 5.2 MIL/uL — ABNORMAL HIGH (ref 3.87–5.11)
RDW: 12.8 % (ref 11.5–15.5)
WBC: 6.9 10*3/uL (ref 4.0–10.5)
nRBC: 0 % (ref 0.0–0.2)

## 2022-11-18 LAB — URINALYSIS, ROUTINE W REFLEX MICROSCOPIC
Bacteria, UA: NONE SEEN
Bilirubin Urine: NEGATIVE
Glucose, UA: NEGATIVE mg/dL
Ketones, ur: NEGATIVE mg/dL
Leukocytes,Ua: NEGATIVE
Nitrite: NEGATIVE
Protein, ur: NEGATIVE mg/dL
Specific Gravity, Urine: 1.014 (ref 1.005–1.030)
pH: 6 (ref 5.0–8.0)

## 2022-11-18 LAB — WET PREP, GENITAL
Clue Cells Wet Prep HPF POC: NONE SEEN
Sperm: NONE SEEN
Trich, Wet Prep: NONE SEEN
WBC, Wet Prep HPF POC: <10 (ref <10)
Yeast Wet Prep HPF POC: NONE SEEN

## 2022-11-18 LAB — POCT PREGNANCY, URINE: Preg Test, Ur: POSITIVE — AB

## 2022-11-18 LAB — ABO/RH: ABO/RH(D): B POS

## 2022-11-18 LAB — HCG, QUANTITATIVE, PREGNANCY: hCG, Beta Chain, Quant, S: 5594 m[IU]/mL — ABNORMAL HIGH (ref <5)

## 2022-11-18 NOTE — MAU Note (Signed)
.  Stephanie Cabrera is a 22 y.o. at Unknown here in MAU reporting: Had positive HPT. Started bleeding on Thursday with wiping. Brown in color. C/o some cramping as well. Denies any recent intercourse. No itching or burning.  LMP: 10/06/22(aprox) Onset of complaint: Thursday Pain score: 5 Vitals:   11/18/22 1123  BP: (!) 141/70  Pulse: 74  Resp: 18  Temp: 98.8 F (37.1 C)     FHT:n/a  Lab orders placed from triage:  u/a, UPT, wet prep , GC

## 2022-11-18 NOTE — MAU Provider Note (Signed)
History     CSN: [id number]  Arrival date and time: 11/18/22 1035   Event Date/Time   First Provider Initiated Contact with Patient 11/18/22 1334      Chief Complaint  Patient presents with   Vaginal Bleeding   Stephanie Cabrera , a  22 y.o. G2P1001 at [redacted]w[redacted]d presents to MAU with complaints of cramping and spotting. She states that the spotting started last "Thursday or Friday" she reports it as being light brown and only with wiping. She denies passing clots or having abnormal vagina discharge prior to this event. She states that she is not actively bleeding at this time. She also reports lower abdominal cramping that started a few weeks ago. She currently rates pain a 5/10 and denies attempting to relieve symptoms. She also denies worsening or alleviating symptoms. Last intercourse was "sometime early last week." She reports a irregular menstrual cycles and states that her LMP was sometime in the first part of July. She has no other complaints.          OB History     Gravida  2   Para  1   Term  1   Preterm      AB      Living  1      SAB      IAB      Ectopic      Multiple  0   Live Births  1           Past Medical History:  Diagnosis Date   Allergy    Medical history non-contributory    Multiple sclerosis ([facility]) 2021   Seizures ([facility])     Past Surgical History:  Procedure Laterality Date   NO PAST SURGERIES      Family History  Problem Relation Age of Onset   Diabetes Paternal Grandmother    Diabetes Paternal Grandfather    Hypertension Paternal Grandfather     Social History   Tobacco Use   Smoking status: Never   Smokeless tobacco: Never  Vaping Use   Vaping status: Never Used  Substance Use Topics   Alcohol use: Never    Comment: occas   Drug use: Never    Allergies: No Known Allergies  Medications Prior to Admission  Medication Sig Dispense Refill Last Dose   metroNIDAZOLE (FLAGYL) 500 MG tablet Take 1 tablet (500  mg total) by mouth 2 (two) times daily. (Patient not taking: Reported on 10/24/2022) 14 tablet 0    Vitamin D, Ergocalciferol, (DRISDOL) 1.25 MG (50000 UNIT) CAPS capsule Take 1 capsule once a week for 8 weeks (Patient not taking: Reported on 08/13/2022) 8 capsule 0     Review of Systems  Constitutional:  Negative for chills, fatigue and fever.  Eyes:  Negative for pain and visual disturbance.  Respiratory:  Negative for apnea, shortness of breath and wheezing.   Cardiovascular:  Negative for chest pain and palpitations.  Gastrointestinal:  Positive for abdominal pain. Negative for constipation, diarrhea, nausea and vomiting.  Genitourinary:  Positive for pelvic pain, vaginal bleeding and vaginal discharge. Negative for difficulty urinating, dysuria and vaginal pain.  Musculoskeletal:  Negative for back pain.  Neurological:  Negative for seizures, weakness and headaches.  Psychiatric/Behavioral:  Negative for suicidal ideas.    Physical Exam   Blood pressure (!) 141/70, pulse 74, temperature 98.8 F (37.1 C), resp. rate 18, height 5\' 7"  (1.702 m), weight 114.3 kg, last menstrual period 10/06/2022.  Physical Exam Vitals and nursing  note reviewed.  Constitutional:      General: She is not in acute distress.    Appearance: Normal appearance.  HENT:     Head: Normocephalic.  Cardiovascular:     Rate and Rhythm: Normal rate and regular rhythm.  Pulmonary:     Effort: Pulmonary effort is normal.  Musculoskeletal:     Cervical back: Normal range of motion.  Skin:    General: Skin is warm and dry.  Neurological:     Mental Status: She is alert and oriented to person, place, and time.  Psychiatric:        Mood and Affect: Mood normal.     MAU Course  Procedures Orders Placed This Encounter  Procedures   Wet prep, genital   US OB LESS THAN 14 WEEKS WITH OB TRANSVAGINAL   Urinalysis, Routine w reflex microscopic -Urine, Clean Catch   CBC   hCG, quantitative, pregnancy   Diet NPO  time specified   Pregnancy, urine POC   ABO/Rh   Discharge patient  US OB LESS THAN 14 WEEKS WITH OB TRANSVAGINAL  Result Date: 11/18/2022 CLINICAL DATA:  Cramps and spotting, pregnant EXAM: OBSTETRIC <14 WK ULTRASOUND TECHNIQUE: Transabdominal ultrasound was performed for evaluation of the gestation as well as the maternal uterus and adnexal regions. COMPARISON:  None Available. FINDINGS: Intrauterine gestational sac: Single Yolk sac:  Visualized. Embryo:  Not Visualized. Cardiac Activity: Not Visualized. Heart Rate: Not visualized. MSD:  7.6 mm   5 w   4  d Subchorionic hemorrhage:  None visualized. Maternal uterus/adnexae: Unremarkable. IMPRESSION: Candidate early intrauterine gestation at sonographic gestational age of [redacted] weeks, 4 days. No fetal pole or fetal cardiac activity identified at this time. Early pregnancy of uncertain viability. Recommend serial beta hCG and follow-up ultrasound in 7-14 days to assess for continued development and viability. Electronically Signed   By: Jearld Lesch M.D.   On: 11/18/2022 13:28     MDM - UA moderate hgb but otherwise normal.;  - wet prep normal  - Hgb and Platelet count normal. Patient hemodynamically stable.  - Quant 11,594  - [place] results reviewed and noted to have a early IUP measuring ~[redacted]w[redacted]d gestation.  - Recommendation to follow up in 48 hours for repeat quant.  - Plan for discharge.    Assessment and Plan   1. Threatened miscarriage   2. Vaginal spotting   3. Abdominal pain, unspecified abdominal location   4. [redacted] weeks gestation of pregnancy    - Reviewed results of threatened miscarriage based on spotting. Discussed recommendation to repeat quant in 48 hours. Patient agreeable to plan of care.  - Appt. Scheduled for [facility] on Wednesday at 0830.  - Reviewed bleeding and return precautions.  - Worsening signs reviewed  - Patient discharged home in stable condition and may return to MAU as needed.   Claudette Head, MSN CNM  11/18/2022, 1:34  PM

## 2022-11-20 ENCOUNTER — Other Ambulatory Visit: Payer: Managed Care, Other (non HMO)

## 2022-11-20 DIAGNOSIS — O200 Threatened abortion: Secondary | ICD-10-CM

## 2022-11-21 ENCOUNTER — Telehealth: Payer: Self-pay | Admitting: *Deleted

## 2022-11-21 ENCOUNTER — Ambulatory Visit (INDEPENDENT_AMBULATORY_CARE_PROVIDER_SITE_OTHER): Payer: Managed Care, Other (non HMO)

## 2022-11-21 DIAGNOSIS — N939 Abnormal uterine and vaginal bleeding, unspecified: Secondary | ICD-10-CM

## 2022-11-21 LAB — BETA HCG QUANT (REF LAB): hCG Quant: 3455 m[IU]/mL

## 2022-11-21 NOTE — Progress Notes (Signed)
Patient presents to the office for UPT. Pt states she is pregnant and has been seen in MAU (see note). Pt states she has been experiencing vaginal bleeding last night but none currently. Pt reports cramping last night but denies cramping now. Hcg ordered but lab unable to collect blood. Pt plans to come back on 11/22/2022 for redraw.

## 2022-11-21 NOTE — Telephone Encounter (Addendum)
-----   Message from Warden Fillers sent at 11/21/2022  8:27 AM EDT ----- Decrease in quant noted, likely miscarriage, advise repeat bhcg in 1 week  1138  Called pt and informed her of BHCG results which are consistent with miscarriage and she needs repeat level done in one week.This level will need to be checked weekly until the level is <5. Pt stated that she has appt @ [facility] tomorrow for this lab test. I advised it will be too soon tomorrow and needs to be done on 8/21 or 8/22. I advised that she will be notified of lab only appt @ [facility] next week. She voiced understanding. Marland Kitchen

## 2022-11-22 ENCOUNTER — Other Ambulatory Visit: Payer: Managed Care, Other (non HMO)

## 2022-11-28 ENCOUNTER — Other Ambulatory Visit: Payer: Managed Care, Other (non HMO)

## 2022-12-26 ENCOUNTER — Ambulatory Visit
Admission: EM | Admit: 2022-12-26 | Discharge: 2022-12-26 | Disposition: A | Discharge status: Home / Self Care | Payer: Managed Care, Other (non HMO) | Attending: Internal Medicine | Admitting: Internal Medicine

## 2022-12-26 DIAGNOSIS — M549 Dorsalgia, unspecified: Secondary | ICD-10-CM

## 2022-12-26 MED ORDER — IBUPROFEN 800 MG PO TABS
800.0000 mg | ORAL_TABLET | Freq: Once | ORAL | Status: AC
  Administered 2022-12-26: 800 mg via ORAL

## 2022-12-26 MED ORDER — NAPROXEN 500 MG PO TABS: 500.0000 mg | ORAL_TABLET | Freq: Two times a day (BID) | ORAL | 0 refills | Status: DC

## 2022-12-26 MED ORDER — CYCLOBENZAPRINE HCL 5 MG PO TABS: 5.0000 mg | ORAL_TABLET | Freq: Three times a day (TID) | ORAL | 0 refills | Status: DC | PRN
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
## 2022-12-31 ENCOUNTER — Encounter: Payer: Self-pay | Admitting: Neurology

## 2022-12-31 ENCOUNTER — Ambulatory Visit (INDEPENDENT_AMBULATORY_CARE_PROVIDER_SITE_OTHER): Payer: Managed Care, Other (non HMO) | Admitting: Neurology

## 2022-12-31 VITALS — BP 110/77 | HR 81 | Ht 67.0 in | Wt 254.6 lb

## 2022-12-31 DIAGNOSIS — G40009 Localization-related (focal) (partial) idiopathic epilepsy and epileptic syndromes with seizures of localized onset, not intractable, without status epilepticus: Secondary | ICD-10-CM

## 2022-12-31 DIAGNOSIS — G35 Multiple sclerosis: Secondary | ICD-10-CM

## 2022-12-31 MED ORDER — VITAMIN D (ERGOCALCIFEROL) 1.25 MG (50000 UNIT) PO CAPS: ORAL_CAPSULE | ORAL | 0 refills | Status: AC
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
## 2023-01-06 ENCOUNTER — Ambulatory Visit (INDEPENDENT_AMBULATORY_CARE_PROVIDER_SITE_OTHER): Payer: Managed Care, Other (non HMO) | Admitting: Emergency Medicine

## 2023-01-06 ENCOUNTER — Other Ambulatory Visit (HOSPITAL_COMMUNITY)
Admission: RE | Admit: 2023-01-06 | Discharge: 2023-01-06 | Disposition: A | Discharge status: Home / Self Care | Payer: Managed Care, Other (non HMO) | Source: Ambulatory Visit | Attending: Obstetrics and Gynecology | Admitting: Obstetrics and Gynecology

## 2023-01-06 VITALS — BP 119/76 | HR 85 | Wt 252.0 lb

## 2023-01-06 DIAGNOSIS — N898 Other specified noninflammatory disorders of vagina: Secondary | ICD-10-CM | POA: Insufficient documentation

## 2023-01-06 NOTE — Progress Notes (Signed)
SUBJECTIVE:  22 y.o. female complains of creamy vaginal discharge for 1 week(s). Denies abnormal vaginal bleeding or significant pelvic pain or fever. No UTI symptoms. Denies history of known exposure to STD.  Patient's last menstrual period was 12/23/2022.  OBJECTIVE:  She appears well, afebrile. Urine dipstick: not done.  ASSESSMENT:  Vaginal Discharge  Vaginal Odor   PLAN:  GC, chlamydia, trichomonas, BVAG, CVAG probe sent to lab. Treatment: To be determined once lab results are received ROV prn if symptoms persist or worsen.

## 2023-01-07 ENCOUNTER — Other Ambulatory Visit: Payer: Self-pay

## 2023-01-07 DIAGNOSIS — B9689 Other specified bacterial agents as the cause of diseases classified elsewhere: Secondary | ICD-10-CM

## 2023-01-07 DIAGNOSIS — B379 Candidiasis, unspecified: Secondary | ICD-10-CM

## 2023-01-07 LAB — CERVICOVAGINAL ANCILLARY ONLY
Bacterial Vaginitis (gardnerella): POSITIVE — AB
Candida Glabrata: NEGATIVE
Candida Vaginitis: POSITIVE — AB
Chlamydia: NEGATIVE
Comment: NEGATIVE
Comment: NEGATIVE
Comment: NEGATIVE
Comment: NEGATIVE
Comment: NEGATIVE
Comment: NORMAL
Neisseria Gonorrhea: NEGATIVE
Trichomonas: NEGATIVE

## 2023-01-07 MED ORDER — FLUCONAZOLE 150 MG PO TABS
150.0000 mg | ORAL_TABLET | Freq: Once | ORAL | 0 refills | Status: AC
Start: 2023-01-07 — End: 2023-01-07

## 2023-01-07 MED ORDER — METRONIDAZOLE 500 MG PO TABS
500.0000 mg | ORAL_TABLET | Freq: Two times a day (BID) | ORAL | 0 refills | Status: DC
Start: 2023-01-07 — End: 2023-07-10

## 2023-01-11 ENCOUNTER — Encounter: Payer: Self-pay | Admitting: Neurology

## 2023-02-03 ENCOUNTER — Ambulatory Visit: Payer: Managed Care, Other (non HMO) | Admitting: Obstetrics and Gynecology

## 2023-02-09 ENCOUNTER — Inpatient Hospital Stay: Admission: RE | Admit: 2023-02-09 | Payer: Managed Care, Other (non HMO) | Source: Ambulatory Visit

## 2023-02-17 ENCOUNTER — Ambulatory Visit: Payer: Managed Care, Other (non HMO)

## 2023-02-21 ENCOUNTER — Encounter: Payer: Self-pay | Admitting: Neurology

## 2023-02-27 ENCOUNTER — Other Ambulatory Visit (HOSPITAL_COMMUNITY)
Admission: RE | Admit: 2023-02-27 | Discharge: 2023-02-27 | Disposition: A | Discharge status: Home / Self Care | Payer: Managed Care, Other (non HMO) | Source: Ambulatory Visit | Attending: Obstetrics and Gynecology | Admitting: Obstetrics and Gynecology

## 2023-02-27 ENCOUNTER — Ambulatory Visit (INDEPENDENT_AMBULATORY_CARE_PROVIDER_SITE_OTHER): Payer: Managed Care, Other (non HMO) | Admitting: *Deleted

## 2023-02-27 VITALS — BP 134/82 | HR 72 | Ht 67.0 in | Wt 256.0 lb

## 2023-02-27 DIAGNOSIS — O219 Vomiting of pregnancy, unspecified: Secondary | ICD-10-CM

## 2023-02-27 DIAGNOSIS — N898 Other specified noninflammatory disorders of vagina: Secondary | ICD-10-CM | POA: Insufficient documentation

## 2023-02-27 DIAGNOSIS — Z3480 Encounter for supervision of other normal pregnancy, unspecified trimester: Secondary | ICD-10-CM

## 2023-02-27 DIAGNOSIS — O26891 Other specified pregnancy related conditions, first trimester: Secondary | ICD-10-CM | POA: Diagnosis present

## 2023-02-27 DIAGNOSIS — Z3201 Encounter for pregnancy test, result positive: Secondary | ICD-10-CM

## 2023-02-27 LAB — POCT URINE PREGNANCY: Preg Test, Ur: POSITIVE — AB

## 2023-02-27 MED ORDER — DOXYLAMINE-PYRIDOXINE 10-10 MG PO TBEC
2.0000 | DELAYED_RELEASE_TABLET | Freq: Every day | ORAL | 5 refills | Status: DC
Start: 2023-02-27 — End: 2023-07-10

## 2023-02-27 MED ORDER — PROMETHAZINE HCL 25 MG PO TABS
25.0000 mg | ORAL_TABLET | Freq: Four times a day (QID) | ORAL | 2 refills | Status: DC | PRN
Start: 2023-02-27 — End: 2023-09-27

## 2023-02-27 MED ORDER — COMPLETENATE 29-1 MG PO CHEW: 1.0000 | CHEWABLE_TABLET | Freq: Every day | ORAL | 12 refills | Status: AC

## 2023-02-27 NOTE — Progress Notes (Signed)
Ms. Walshe presents today for UPT. She has no unusual complaints. LMP:    OBJECTIVE: Appears well, in no apparent distress.  OB History     Gravida  2   Para  1   Term  1   Preterm      AB      Living  1      SAB      IAB      Ectopic      Multiple  0   Live Births  1          Home UPT Result: Positive In-Office UPT result: Positive I have reviewed the patient's medical, obstetrical, social, and family histories, and medications.   ASSESSMENT: Positive pregnancy test  PLAN Prenatal care to be completed at: [facility], Ectopic, and extreme N/V precautions given.  SUBJECTIVE:  22 y.o. female complains of white and creamy vaginal discharge for 5 day(s). Denies abnormal vaginal bleeding or significant pelvic pain or fever. No UTI symptoms. Denies history of known exposure to STD.  Patient's last menstrual period was 12/23/2022.  OBJECTIVE:  She appears well, afebrile. Urine dipstick: not done.  ASSESSMENT:  Vaginal Discharge  Vaginal Odor   PLAN:  GC, chlamydia, trichomonas, BVAG, CVAG probe sent to lab. Treatment: To be determined once lab results are received ROV prn if symptoms persist or worsen.

## 2023-02-28 LAB — CERVICOVAGINAL ANCILLARY ONLY
Bacterial Vaginitis (gardnerella): NEGATIVE
Candida Glabrata: NEGATIVE
Candida Vaginitis: POSITIVE — AB
Chlamydia: NEGATIVE
Comment: NEGATIVE
Comment: NEGATIVE
Comment: NEGATIVE
Comment: NEGATIVE
Comment: NEGATIVE
Comment: NORMAL
Neisseria Gonorrhea: NEGATIVE
Trichomonas: NEGATIVE

## 2023-03-03 MED ORDER — FLUCONAZOLE 150 MG PO TABS: 150.0000 mg | ORAL_TABLET | Freq: Once | ORAL | 0 refills | Status: AC

## 2023-03-03 NOTE — Addendum Note (Signed)
Addended by: Adam Phenix on: 03/03/2023 09:40 AM   Modules accepted: Orders

## 2023-03-10 ENCOUNTER — Other Ambulatory Visit: Payer: Self-pay | Admitting: Neurology

## 2023-03-10 ENCOUNTER — Telehealth: Payer: Self-pay

## 2023-03-10 MED ORDER — SODIUM CHLORIDE 0.9 % IV SOLN: 600.0000 mg | Freq: Once | INTRAVENOUS | 1 refills | Status: AC

## 2023-03-10 NOTE — Telephone Encounter (Signed)
Rx printed

## 2023-03-10 NOTE — Telephone Encounter (Addendum)
Patient is scheduled to get treated on 12/6. Stephanie Cabrera needs a new prescription for her Ocrevus.  [facility] patient [facility]: Phone: [phone] Fax: [phone] Id: [id number]  Please let me know when the prescription has been sent so I can follow up with [facility] and see if the Ocrevus can be delivered by 12/6.  Thank you!

## 2023-03-10 NOTE — Addendum Note (Signed)
Addended by: Glendale Chard on: 03/10/2023 12:48 PM   Modules accepted: Orders

## 2023-03-13 ENCOUNTER — Other Ambulatory Visit (INDEPENDENT_AMBULATORY_CARE_PROVIDER_SITE_OTHER): Payer: Self-pay

## 2023-03-13 ENCOUNTER — Ambulatory Visit (INDEPENDENT_AMBULATORY_CARE_PROVIDER_SITE_OTHER): Payer: Managed Care, Other (non HMO) | Admitting: *Deleted

## 2023-03-13 VITALS — BP 122/74 | HR 73 | Wt 256.6 lb

## 2023-03-13 DIAGNOSIS — Z3A11 11 weeks gestation of pregnancy: Secondary | ICD-10-CM | POA: Diagnosis not present

## 2023-03-13 DIAGNOSIS — O0990 Supervision of high risk pregnancy, unspecified, unspecified trimester: Secondary | ICD-10-CM | POA: Insufficient documentation

## 2023-03-13 DIAGNOSIS — O0991 Supervision of high risk pregnancy, unspecified, first trimester: Secondary | ICD-10-CM

## 2023-03-13 DIAGNOSIS — O3680X0 Pregnancy with inconclusive fetal viability, not applicable or unspecified: Secondary | ICD-10-CM

## 2023-03-13 DIAGNOSIS — Z1339 Encounter for screening examination for other mental health and behavioral disorders: Secondary | ICD-10-CM | POA: Diagnosis not present

## 2023-03-13 NOTE — Progress Notes (Signed)
New OB Intake  I connected with Stephanie Cabrera  on 03/13/23 at  8:15 AM EST by In Person Visit and verified that I am speaking with the correct person using two identifiers. Nurse is located at [facility] and pt is located at [place].  I discussed the limitations, risks, security and privacy concerns of performing an evaluation and management service by telephone and the availability of in person appointments. I also discussed with the patient that there may be a patient responsible charge related to this service. The patient expressed understanding and agreed to proceed.  I explained I am completing New OB Intake today. We discussed EDD of 09/29/2023, by Last Menstrual Period. Pt is G3P1001. I reviewed her allergies, medications and Medical/Surgical/OB history.    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 Discussed the nature of our practice with multiple providers including residents and students. Due to the size of the practice, the delivering provider may not be the same as those providing prenatal care.   Patient interested in water birth. Offered upcoming OB visit with CNM to discuss further.  MyChart/Babyscripts MyChart access verified. I explained pt will have some visits in office and some virtually. Babyscripts instructions given and order placed. Patient verifies receipt of registration text/e-mail. Account successfully created and app downloaded.  Blood Pressure Cuff/Weight Scale Patient has private insurance; instructed to purchase blood pressure cuff and bring to first prenatal appt. Explained after first prenatal appt pt will check weekly and document in Babyscripts. Patient does not have weight scale; patient may purchase if they desire to track weight weekly in  Babyscripts.  Anatomy US Explained first scheduled [place] will be around 19 weeks. Anatomy US scheduled for TBD at TBD.  Interested in [place]? If yes, send referral and doula dot phrase.   Is patient a candidate for Babyscripts Optimization? No, due to High Risk   First visit review I reviewed new OB appt with patient. Explained pt will be seen by Dr. Jolayne Panther at first visit. Discussed Avelina Laine genetic screening with patient. Requests Panorama and Horizon.. Routine prenatal labs  collected at today's visit.    Last Pap Diagnosis  Date Value Ref Range Status  06/04/2021   Final   - Negative for intraepithelial lesion or malignancy (NILM)    Harrel Lemon, RN 03/13/2023  8:20 AM

## 2023-03-13 NOTE — Patient Instructions (Signed)
The [facility] [employer] has a partnership with the [employer] to provide prenatal navigation for the most needed resources in our community. In order to see how we can help connect you to these resources we need consent to contact you. Please complete the very short consent using the link below:   English Link: https://[place].tfaforms.net/283?site=16  Spanish Link: https://[place].tfaforms.net/287?site=16  Options for Doula Care in the [place] Area  As you review your birthing options, consider having a birth doula. A doula is trained to provide support before, during and just after you give birth. There are also postpartum doulas that help you adjust to new parenthood.  While doulas do not provide medical care, they do provide emotional, physical and educational support. A few months before your baby arrives, doulas can help answer questions, ease concerns and help you create and support your birthing plan.    Doulas can help reduce your stress and comfort you and your partner. They can help you cope with labor by helping you use breathing techniques, massage, creative labor positioning, essential oils and affirmations.   Studies show that the benefits of having a doula include:   A more positive birth experience  Fewer requests for pain-relief medication  Less likelihood of cesarean section, commonly called a c-section   Doulas are typically hired via a [employer] account planner between you and the doula. We are happy to provide a list of the most active doulas in the area, all of whom are credentialed by [facility] and will not count as a visitor at your birth.  There are several options for no-cost doula care at our hospital, including:  [facility] Volunteer Doula Program Every [employer] Program A Cure 4 Moms Doula Study (available only at [employer] for Women, [place], [place] and [employer] [facility] offices)  For more information on these programs or to receive  a list of doulas active in our area, please email [email or web address] [email or web address]  Our practice his participating in a study that provides no-cost doula care. ACURE4Moms is a study looking at how doula care can reduce birthing disparities for Black and brown birthing people. We like to refer patients as soon as possible, but definitely before 28 weeks so patients can get to know their doula.    A doula is trained to provide support before, during and just after you give birth. While doulas do not provide medical care, they do provide emotional, physical and educational support. Doulas can help reduce your stress and comfort you and your partner. They can help you cope with labor by helping you use breathing techniques, massage, creative labor positioning, essential oils and affirmations.   ACURE4Moms is a research study trying to reduce:   low birthweight babies  emergency department visits & hospitalizations for birthing persons and their babies  depression among birthing people  discrimination in pregnancy-related care ACURE4Moms is trying out 2 programs designed by  people who have given birth. These programs include: 1. Sharing patient data and warning alerts with clinic staff to keep them accountable for their patients' outcomes and providing tools to help them  reduce bias in care. 2. Matching eligible patients with doulas from the  same community as the patients.  If you would like to participate in this study, please visit:   http://carroll-castaneda.info/

## 2023-03-14 ENCOUNTER — Ambulatory Visit: Payer: Managed Care, Other (non HMO)

## 2023-03-14 LAB — CBC/D/PLT+RPR+RH+ABO+RUBIGG...
Antibody Screen: NEGATIVE
Basophils Absolute: 0 10*3/uL (ref 0.0–0.2)
Basos: 0 %
EOS (ABSOLUTE): 0.1 10*3/uL (ref 0.0–0.4)
Eos: 1 %
HCV Ab: NONREACTIVE
HIV Screen 4th Generation wRfx: NONREACTIVE
Hematocrit: 41.9 % (ref 34.0–46.6)
Hemoglobin: 13.5 g/dL (ref 11.1–15.9)
Hepatitis B Surface Ag: NEGATIVE
Immature Grans (Abs): 0 10*3/uL (ref 0.0–0.1)
Immature Granulocytes: 0 %
Lymphocytes Absolute: 1.9 10*3/uL (ref 0.7–3.1)
Lymphs: 21 %
MCH: 28.2 pg (ref 26.6–33.0)
MCHC: 32.2 g/dL (ref 31.5–35.7)
MCV: 88 fL (ref 79–97)
Monocytes Absolute: 0.7 10*3/uL (ref 0.1–0.9)
Monocytes: 7 %
Neutrophils Absolute: 6.4 10*3/uL (ref 1.4–7.0)
Neutrophils: 71 %
Platelets: 313 10*3/uL (ref 150–450)
RBC: 4.78 x10E6/uL (ref 3.77–5.28)
RDW: 12.8 % (ref 11.7–15.4)
RPR Ser Ql: NONREACTIVE
Rh Factor: POSITIVE
Rubella Antibodies, IGG: 8.68 {index} (ref >0.99)
WBC: 9.2 10*3/uL (ref 3.4–10.8)

## 2023-03-14 LAB — HEMOGLOBIN A1C
Est. average glucose Bld gHb Est-mCnc: 108 mg/dL
Hgb A1c MFr Bld: 5.4 % (ref 4.8–5.6)

## 2023-03-14 LAB — HCV INTERPRETATION

## 2023-03-15 LAB — CULTURE, OB URINE

## 2023-03-15 LAB — URINE CULTURE, OB REFLEX

## 2023-03-21 LAB — PANORAMA PRENATAL TEST FULL PANEL:PANORAMA TEST PLUS 5 ADDITIONAL MICRODELETIONS: FETAL FRACTION: 10.4

## 2023-03-21 LAB — HORIZON CUSTOM: REPORT SUMMARY: NEGATIVE

## 2023-04-03 ENCOUNTER — Inpatient Hospital Stay: Admission: RE | Admit: 2023-04-03 | Payer: Managed Care, Other (non HMO) | Source: Ambulatory Visit

## 2023-04-07 ENCOUNTER — Encounter: Payer: Self-pay | Admitting: Obstetrics and Gynecology

## 2023-04-07 ENCOUNTER — Ambulatory Visit (INDEPENDENT_AMBULATORY_CARE_PROVIDER_SITE_OTHER): Payer: Managed Care, Other (non HMO) | Admitting: Obstetrics and Gynecology

## 2023-04-07 VITALS — BP 119/80 | HR 79 | Wt 253.2 lb

## 2023-04-07 DIAGNOSIS — O0992 Supervision of high risk pregnancy, unspecified, second trimester: Secondary | ICD-10-CM | POA: Diagnosis not present

## 2023-04-07 DIAGNOSIS — G35 Multiple sclerosis: Secondary | ICD-10-CM | POA: Diagnosis not present

## 2023-04-07 DIAGNOSIS — Z3A15 15 weeks gestation of pregnancy: Secondary | ICD-10-CM | POA: Diagnosis not present

## 2023-04-07 DIAGNOSIS — O0990 Supervision of high risk pregnancy, unspecified, unspecified trimester: Secondary | ICD-10-CM

## 2023-04-07 NOTE — Progress Notes (Addendum)
  Subjective:    Stephanie Cabrera is a [id number] [redacted]w[redacted]d being seen today for her first obstetrical visit.  Her obstetrical history is significant for obesity and history of MS. Patient is followed by neurology and reports not needing any medications right now. She denies any recent flares. Her next appointment is in April. Patient does intend to breast feed. Pregnancy history fully reviewed.  Patient reports some nausea which is improving.  Vitals:   04/07/23 1013  BP: 119/80  Pulse: 79  Weight: 253 lb 3.2 oz (114.9 kg)    HISTORY: OB History  Gravida Para Term Preterm AB Living  3 1 1  1 1   SAB IAB Ectopic Multiple Live Births  1   0 1    # Outcome Date GA Lbr Len/2nd Weight Sex Type Anes PTL Lv  3 Current           2 SAB 01/2023 [redacted]w[redacted]d    SAB     1 Term 09/06/18 [redacted]w[redacted]d 14:15 / 01:30 7 lb 11.5 oz (3.5 kg) F Vag-Spont EPI  LIV   Past Medical History:  Diagnosis Date   Allergy    Multiple sclerosis (HCC) 2021   Seizures (HCC)    Past Surgical History:  Procedure Laterality Date   NO PAST SURGERIES     Family History  Problem Relation Age of Onset   Diabetes Paternal Grandmother    Diabetes Paternal Grandfather    Hypertension Paternal Grandfather      Exam    System: Breast:  normal appearance, no masses or tenderness   Skin: normal coloration and turgor, no rashes    Neurologic: oriented, no focal deficits   Extremities: normal strength, tone, and muscle mass   HEENT extra ocular movement intact   Mouth/Teeth mucous membranes moist, pharynx normal without lesions and dental hygiene good   Neck supple and no masses   Cardiovascular: regular rate and rhythm   Respiratory:  appears well, vitals normal, no respiratory distress, acyanotic, normal RR, chest clear, no wheezing, crepitations, rhonchi, normal symmetric air entry   Abdomen: soft, non-tender; bowel sounds normal; no masses,  no organomegaly   Urinary:       Assessment:    Pregnancy:  G3P1011 Patient Active Problem List   Diagnosis Date Noted   Supervision of high risk pregnancy, antepartum 03/13/2023   MS (multiple sclerosis) (HCC) 07/03/2020   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 Prenatal vitamins. Problem list reviewed and updated. Genetic Screening discussed : results reviewed.  Ultrasound discussed; fetal survey: ordered. AFP today  Follow up in 4 weeks. 50% of 30 min visit spent on counseling and coordination of care.     Jenney Brester 04/07/2023

## 2023-04-07 NOTE — Progress Notes (Signed)
Pt presents for ROB. No questions or concerns. 

## 2023-04-09 LAB — AFP, SERUM, OPEN SPINA BIFIDA
AFP MoM: 0.66
AFP Value: 15.4 ng/mL
Gest. Age on Collection Date: 15 wk
Maternal Age At EDD: 24.7 a
OSBR Risk 1 IN: 10000
Test Results:: NEGATIVE
Weight: 253 [lb_av]

## 2023-04-09 NOTE — L&D Delivery Note (Cosign Needed Addendum)
 OB/GYN Faculty Practice Delivery Note  Stephanie Cabrera is a 23 y.o. G3P1011 s/p VAVD at [redacted]w[redacted]d. She was admitted for IOL for gHTN.   ROM: 7h 37m with clear fluid at AROM to moderate meconium-stained fluid at delivery GBS Status: POSITIVE/-- (06/19 1725) Maximum Maternal Temperature: Temp (24hrs), Avg:97.8 F (36.6 C), Min:97.4 F (36.3 C), Max:98.5 F (36.9 C)   Labor Progress: Initial SVE: 2.5/40/-3. AROM and Pitocin  required. She then quickly progressed to complete.   Delivery Date/Time: 09/26/23 0600 Delivery: Called to room and patient was complete and feeling urge to push. Pushed with excellent effort, however after a few contractions baby decelerated to 70's. Did not return back to baseline despite position changes. At 5 minutes, patient had not delivered babe despite excellent effort. Found to be OA and +2 station. Recommended vacuum assistance for delivery. Verbal consent: obtained from patient.  Risks and benefits discussed.  Risks include, but are not limited to the risks of anesthesia, bleeding, infection, damage to maternal tissues, fetal cephalhematoma.  There is also the risk of inability to effect vaginal delivery of the head, or shoulder dystocia that cannot be resolved by established maneuvers, leading to the need for emergency cesarean section.  The MityVac was positioned over the sagittal suture 3 cm anterior to posterior fontanelle.  Pressure was then increased to 500 mmHg, and the patient was instructed to push.  Pulling was administered along the pelvic curve.  4 pulls were administered during 2 contractions. 2 popoffs. The infant was then delivered atraumatically. LOA. No nuchal cord. Cord clamped and cut at 30 seconds and handed to awaiting NICU team.  Cord gases obtained. Arterial ph 7.06. Cord blood drawn. Placenta delivered spontaneously with gentle cord traction. Fundus firm with massage and Pitocin . Labia, perineum, and vagina inspected with 2nd degree perineal  and bilateral labial tears. 2nd degree very shallow; repaired in a running fashion with 3-0 vicryl for hemostasis. Left labial tear required repair for hemostasis with 4-0 vicryl. [name] returned to mom prior to my departure from the room. Mom and baby to postpartum. Baby Weight: pending  Placenta: 3 vessel, intact. Sent to L&D Complications: Vacuum delivery for terminal bradycardia Lacerations: 2nd degree and bilateral labial EBL: 150 mL Anesthesia: epidural  Infant: baby boy [place]. APGAR (1 MIN): 5  APGAR (5 MINS): 8  APGAR (10 MINS):    Maud Sorenson, MD [facility] Family Medicine Fellow, [facility] for [facility], [facility] 09/26/2023, 6:57 AM

## 2023-04-29 ENCOUNTER — Encounter: Payer: Self-pay | Admitting: Obstetrics and Gynecology

## 2023-05-05 ENCOUNTER — Encounter: Payer: Self-pay | Admitting: Nurse Practitioner

## 2023-05-05 ENCOUNTER — Ambulatory Visit (INDEPENDENT_AMBULATORY_CARE_PROVIDER_SITE_OTHER): Payer: Managed Care, Other (non HMO) | Admitting: Nurse Practitioner

## 2023-05-05 ENCOUNTER — Other Ambulatory Visit (HOSPITAL_COMMUNITY)
Admission: RE | Admit: 2023-05-05 | Discharge: 2023-05-05 | Disposition: A | Discharge status: Home / Self Care | Payer: Managed Care, Other (non HMO) | Source: Ambulatory Visit | Attending: Obstetrics and Gynecology | Admitting: Obstetrics and Gynecology

## 2023-05-05 VITALS — BP 130/78 | HR 89 | Wt 262.8 lb

## 2023-05-05 DIAGNOSIS — O0990 Supervision of high risk pregnancy, unspecified, unspecified trimester: Secondary | ICD-10-CM

## 2023-05-05 DIAGNOSIS — O23592 Infection of other part of genital tract in pregnancy, second trimester: Secondary | ICD-10-CM | POA: Diagnosis present

## 2023-05-05 DIAGNOSIS — O99212 Obesity complicating pregnancy, second trimester: Secondary | ICD-10-CM

## 2023-05-05 DIAGNOSIS — O99210 Obesity complicating pregnancy, unspecified trimester: Secondary | ICD-10-CM | POA: Insufficient documentation

## 2023-05-05 DIAGNOSIS — O219 Vomiting of pregnancy, unspecified: Secondary | ICD-10-CM

## 2023-05-05 DIAGNOSIS — Z3A19 19 weeks gestation of pregnancy: Secondary | ICD-10-CM

## 2023-05-05 MED ORDER — MICONAZOLE NITRATE 2 % VA CREA: 1.0000 | TOPICAL_CREAM | Freq: Every day | VAGINAL | 0 refills | Status: AC

## 2023-05-05 NOTE — Progress Notes (Signed)
   PRENATAL VISIT NOTE  Subjective:  Stephanie Cabrera is a 23 y.o. G3P1011 at [redacted]w[redacted]d being seen today for ongoing prenatal care.  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  Patient reports vomiting.  Contractions: Not present. Vag. Bleeding: None.   . Denies leaking of fluid.   The following portions of the patient's history were reviewed and updated as appropriate: allergies, current medications, past family history, past medical history, past social history, past surgical history and problem list.   Objective:   Vitals:   05/05/23 1114  BP: 130/78  Pulse: 89  Weight: 262 lb 12.8 oz (119.2 kg)    Fetal Status: Fetal Heart Rate (bpm): 154 Fundal Height: 20 cm       General:  Alert, oriented and cooperative. Patient is in no acute distress.  Skin: Skin is warm and dry. No rash noted.   Cardiovascular: Normal heart rate noted  Respiratory: Normal respiratory effort, no problems with respiration noted  Abdomen: Soft, gravid, appropriate for gestational age.  Pain/Pressure: Absent     Pelvic: Cervical exam deferred        Extremities: Normal range of motion.  Edema: None  Mental Status: Normal mood and affect. Normal behavior. Normal judgment and thought content.   Assessment and Plan:  Pregnancy: G3P1011 at [redacted]w[redacted]d  1. Vaginitis affecting pregnancy in second trimester, antepartum (Primary) - patient declined pelvic exam , states "it's a yeast infection" - RX sent for ppx at patient's request - Cervicovaginal ancillary only( [place]) - Culture, OB Urine  2. Supervision of high risk pregnancy, antepartum - pt has MS - followed by [facility] - Neurology  3. [redacted] weeks gestation of pregnancy -05/13/23 scheduled Anatomy Sono w/ MFM  4. Obesity affecting pregnancy in  second trimester, unspecified obesity type BMI >40    Preterm labor symptoms and general obstetric precautions including but not limited to vaginal bleeding, contractions, leaking of fluid and fetal movement were reviewed in detail with the patient.  Please refer to After Visit Summary for other counseling recommendations.   Return in about 4 weeks (around 06/02/2023) for [facility].  Future Appointments  Date Time Provider Department Center  05/13/2023 10:15 AM [facility] NURSE [facility]  05/13/2023 10:30 AM [facility] US1 [facility]  06/02/2023 10:35 AM Constant, Gigi Gin, MD [facility] None  07/10/2023 11:30 AM Van Clines, MD [facility] None   Marcell Barlow, MSN, [facility], Center for [facility]

## 2023-05-05 NOTE — Progress Notes (Signed)
Pt presents for ROB. Complaints of vaginal itching, cloudy discharge.

## 2023-05-06 ENCOUNTER — Encounter: Payer: Self-pay | Admitting: Nurse Practitioner

## 2023-05-06 LAB — CERVICOVAGINAL ANCILLARY ONLY
Bacterial Vaginitis (gardnerella): NEGATIVE
Candida Glabrata: NEGATIVE
Candida Vaginitis: POSITIVE — AB
Chlamydia: NEGATIVE
Comment: NEGATIVE
Comment: NEGATIVE
Comment: NEGATIVE
Comment: NEGATIVE
Comment: NEGATIVE
Comment: NORMAL
Neisseria Gonorrhea: NEGATIVE
Trichomonas: NEGATIVE

## 2023-05-06 NOTE — Progress Notes (Signed)
Patient TX at visit appropriately

## 2023-05-08 LAB — URINE CULTURE, OB REFLEX

## 2023-05-08 LAB — CULTURE, OB URINE

## 2023-05-13 ENCOUNTER — Other Ambulatory Visit: Payer: Self-pay | Admitting: *Deleted

## 2023-05-13 ENCOUNTER — Ambulatory Visit (HOSPITAL_BASED_OUTPATIENT_CLINIC_OR_DEPARTMENT_OTHER): Payer: Managed Care, Other (non HMO) | Admitting: Maternal & Fetal Medicine

## 2023-05-13 ENCOUNTER — Encounter: Payer: Self-pay | Admitting: Obstetrics and Gynecology

## 2023-05-13 ENCOUNTER — Other Ambulatory Visit: Payer: Self-pay

## 2023-05-13 ENCOUNTER — Encounter: Payer: Self-pay | Admitting: *Deleted

## 2023-05-13 ENCOUNTER — Ambulatory Visit: Payer: Managed Care, Other (non HMO) | Admitting: *Deleted

## 2023-05-13 ENCOUNTER — Ambulatory Visit: Payer: Managed Care, Other (non HMO) | Attending: Obstetrics & Gynecology

## 2023-05-13 VITALS — BP 130/64 | HR 96

## 2023-05-13 DIAGNOSIS — O0990 Supervision of high risk pregnancy, unspecified, unspecified trimester: Secondary | ICD-10-CM | POA: Insufficient documentation

## 2023-05-13 DIAGNOSIS — O99210 Obesity complicating pregnancy, unspecified trimester: Secondary | ICD-10-CM

## 2023-05-13 DIAGNOSIS — Z362 Encounter for other antenatal screening follow-up: Secondary | ICD-10-CM

## 2023-05-13 DIAGNOSIS — Z3A20 20 weeks gestation of pregnancy: Secondary | ICD-10-CM | POA: Insufficient documentation

## 2023-05-13 DIAGNOSIS — G35 Multiple sclerosis: Secondary | ICD-10-CM | POA: Insufficient documentation

## 2023-05-13 DIAGNOSIS — O99352 Diseases of the nervous system complicating pregnancy, second trimester: Secondary | ICD-10-CM | POA: Diagnosis not present

## 2023-05-13 DIAGNOSIS — E669 Obesity, unspecified: Secondary | ICD-10-CM

## 2023-05-13 DIAGNOSIS — O99212 Obesity complicating pregnancy, second trimester: Secondary | ICD-10-CM

## 2023-05-13 NOTE — Progress Notes (Signed)
 Patient information  Patient Name: Stephanie Cabrera  Patient MRN:   [id number]  Referring practice: MFM Referring Provider: [facility])  MFM CONSULT  MICHAEL VENTRESCA is a 23 y.o. G3P1011 at [redacted]w[redacted]d here for ultrasound and consultation. 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 She denies contractions, bleeding, or loss of fluid and reports good fetal movement.   RE multiple sclerosis: Currently doing well with no new concerning symptoms.  She is managed on a Ocrevus  outside of pregnancy but has not required medication during the pregnancy.  I discussed that pregnancy typically has a minimizing effect on the disease process.  It does not have any increased risk of long-term deterioration.  There is a risk for postpartum flare in the first 6 months after delivery.  Some medications are not compatible with breast-feeding and can be assessed on an individual basis.  Flares are treated acutely with steroids under the guidance of neurology.  There are no strong associations with growth restriction or preterm birth and therefore clinical management is typically per other obstetric indications.  RE elevated BMI: This complicates nearly every aspect of pregnancy care but I reassured the patient and the patient seems to do well with obesity in pregnancy.  This date increases the risk of gestational hypertension and diabetes.  Also increases the risk of needing an operative delivery as well as the ability to detect birth defects.  Recommend weight gain to no more than 20 pounds.  Due to the increased risk of preeclampsia 81 mg of aspirin  is recommended  throughout the pregnancy.  Sonographic findings Single intrauterine pregnancy at 20w 1d  Fetal cardiac activity:  Observed and appears normal. Presentation: Transverse, head to maternal right. The anatomic structures that were well seen appear normal without evidence of soft markers. Due to poor acoustic windows some structures remain suboptimally visualized. Fetal biometry shows the estimated fetal weight at the 78 percentile.  Amniotic fluid: Within normal limits.  MVP: 3.9 cm. Placenta: Posterior Fundal. Adnexa: Not well visualized. Cervical length: 3.1 cm.  There are limitations of prenatal ultrasound such as the inability to detect certain abnormalities due to poor visualization. Various factors such as fetal position, gestational age and maternal body habitus may increase the difficulty in visualizing the fetal anatomy.    Recommendations -EDD is Estimated Date of Delivery: 09/29/23. -Detailed ultrasound was done today without abnormalities but much of the fetal anatomy remains poorly visualized. -Baseline preeclampsia labs: CMP, CBC and urine protein/creatinine ratio if not previously completed.  -Early glucose screening due to multiple risk factors. -Continue Aspirin  81 mg for preeclampsia prophylaxis -Follow-up anatomy and fetal growth in 4 to 6 weeks -Serial growth ultrasounds starting around 28 weeks to monitor for fetal growth restriction -Antenatal testing is not currently indicated. -Delivery timing pending clinical course but likely around 39-40 weeks gestion -Continue routine prenatal care with referring OB provider  Review of Systems: A review of systems was performed and was negative except per HPI   Vitals and Physical Exam    05/13/2023   10:27 AM 05/05/2023   11:14 AM 04/07/2023   10:13 AM  Vitals with BMI  Weight  262 lbs 13 oz  253 lbs 3 oz  Systolic 130 130 880  Diastolic 64 78 80  Pulse 96 89 79    Sitting comfortably on the sonogram table Nonlabored  breathing Normal rate and rhythm Abdomen is nontender  Past pregnancies OB History  Gravida Para Term Preterm AB Living  3 1 1  1 1   SAB IAB Ectopic Multiple Live Births  1   0 1    # Outcome Date GA Lbr Len/2nd Weight Sex Type Anes PTL Lv  3 Current           2 SAB 01/2023 [redacted]w[redacted]d    SAB     1 Term 09/06/18 [redacted]w[redacted]d 14:15 / 01:30 7 lb 11.5 oz (3.5 kg) F Vag-Spont EPI  LIV    I spent 30 minutes reviewing the patients chart, including labs and images as well as counseling the patient about her medical conditions. Greater than 50% of the time was spent in direct face-to-face patient counseling.  Stephanie Cabrera  MFM, [facility]   05/13/2023  11:19 AM

## 2023-06-02 ENCOUNTER — Encounter: Payer: Self-pay | Admitting: Obstetrics and Gynecology

## 2023-06-02 ENCOUNTER — Ambulatory Visit (INDEPENDENT_AMBULATORY_CARE_PROVIDER_SITE_OTHER): Payer: Managed Care, Other (non HMO) | Admitting: Obstetrics and Gynecology

## 2023-06-02 VITALS — BP 114/79 | HR 96 | Wt 251.0 lb

## 2023-06-02 DIAGNOSIS — O0990 Supervision of high risk pregnancy, unspecified, unspecified trimester: Secondary | ICD-10-CM | POA: Diagnosis not present

## 2023-06-02 DIAGNOSIS — G35 Multiple sclerosis: Secondary | ICD-10-CM

## 2023-06-02 DIAGNOSIS — O99210 Obesity complicating pregnancy, unspecified trimester: Secondary | ICD-10-CM | POA: Diagnosis not present

## 2023-06-02 MED ORDER — ASPIRIN 81 MG PO TBEC: 81.0000 mg | DELAYED_RELEASE_TABLET | Freq: Every day | ORAL | 2 refills | Status: DC

## 2023-06-02 NOTE — Progress Notes (Signed)
   PRENATAL VISIT NOTE  Subjective:  Stephanie Cabrera is a 23 y.o. G3P1011 at [redacted]w[redacted]d being seen today for ongoing prenatal care.  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  Patient reports no complaints.  Contractions: Not present. Vag. Bleeding: None.  Movement: Present. Denies leaking of fluid.   The following portions of the patient's history were reviewed and updated as appropriate: allergies, current medications, past family history, past medical history, past social history, past surgical history and problem list.   Objective:   Vitals:   06/02/23 1041  BP: 114/79  Pulse: 96  Weight: 251 lb (113.9 kg)    Fetal Status:     Movement: Present     General:  Alert, oriented and cooperative. Patient is in no acute distress.  Skin: Skin is warm and dry. No rash noted.   Cardiovascular: Normal heart rate noted  Respiratory: Normal respiratory effort, no problems with respiration noted  Abdomen: Soft, gravid, appropriate for gestational age.  Pain/Pressure: Present     Pelvic: Cervical exam deferred        Extremities: Normal range of motion.  Edema: None  Mental Status: Normal mood and affect. Normal behavior. Normal judgment and thought content.   Assessment and Plan:  Pregnancy: G3P1011 at [redacted]w[redacted]d 1. Supervision of high risk pregnancy, antepartum (Primary) Patient is doing well without complaints Follow up anatomy ultrasound on 3/6 Third trimester labs and glucola next visit  2. Obesity affecting pregnancy, antepartum, unspecified obesity type Rx for ASA provided  3. MS (multiple sclerosis) (HCC) Stable  Preterm labor symptoms and general obstetric precautions including but not limited to vaginal bleeding,  contractions, leaking of fluid and fetal movement were reviewed in detail with the patient. Please refer to After Visit Summary for other counseling recommendations.   Return in about 4 weeks (around 06/30/2023) for in person, ROB, High risk, 2 hr glucola next visit.  Future Appointments  Date Time Provider Department Center  06/12/2023  9:15 AM [facility] NURSE [facility]  06/12/2023  9:30 AM [facility] US5 [facility]  07/10/2023 11:30 AM Van Clines, MD [facility] None    Catalina Antigua, MD

## 2023-06-12 ENCOUNTER — Other Ambulatory Visit: Payer: Self-pay

## 2023-06-12 ENCOUNTER — Ambulatory Visit: Payer: Managed Care, Other (non HMO) | Admitting: *Deleted

## 2023-06-12 ENCOUNTER — Ambulatory Visit: Payer: Managed Care, Other (non HMO) | Attending: Maternal & Fetal Medicine

## 2023-06-12 ENCOUNTER — Encounter: Payer: Self-pay | Admitting: Neurology

## 2023-06-12 VITALS — BP 131/65 | HR 78

## 2023-06-12 DIAGNOSIS — O99210 Obesity complicating pregnancy, unspecified trimester: Secondary | ICD-10-CM | POA: Insufficient documentation

## 2023-06-12 DIAGNOSIS — G35 Multiple sclerosis: Secondary | ICD-10-CM | POA: Insufficient documentation

## 2023-06-12 DIAGNOSIS — E669 Obesity, unspecified: Secondary | ICD-10-CM | POA: Diagnosis not present

## 2023-06-12 DIAGNOSIS — O0990 Supervision of high risk pregnancy, unspecified, unspecified trimester: Secondary | ICD-10-CM | POA: Diagnosis present

## 2023-06-12 DIAGNOSIS — O99352 Diseases of the nervous system complicating pregnancy, second trimester: Secondary | ICD-10-CM

## 2023-06-12 DIAGNOSIS — Z362 Encounter for other antenatal screening follow-up: Secondary | ICD-10-CM | POA: Insufficient documentation

## 2023-06-12 DIAGNOSIS — Z3A24 24 weeks gestation of pregnancy: Secondary | ICD-10-CM

## 2023-06-12 DIAGNOSIS — O99212 Obesity complicating pregnancy, second trimester: Secondary | ICD-10-CM

## 2023-06-23 ENCOUNTER — Encounter: Payer: Self-pay | Admitting: Neurology

## 2023-06-27 ENCOUNTER — Telehealth: Payer: Self-pay | Admitting: Pharmacy Technician

## 2023-06-27 NOTE — Telephone Encounter (Addendum)
 Stephanie Cabrera must be submitted through [facility]. Pending  Rep: Carleene Overlie ext: [phone] Fax: [phone] Phone: [phone]

## 2023-06-30 ENCOUNTER — Ambulatory Visit (INDEPENDENT_AMBULATORY_CARE_PROVIDER_SITE_OTHER): Payer: Managed Care, Other (non HMO) | Admitting: Obstetrics and Gynecology

## 2023-06-30 ENCOUNTER — Other Ambulatory Visit: Payer: Managed Care, Other (non HMO)

## 2023-06-30 ENCOUNTER — Encounter: Payer: Self-pay | Admitting: Obstetrics and Gynecology

## 2023-06-30 VITALS — BP 132/80 | HR 81 | Wt 256.0 lb

## 2023-06-30 DIAGNOSIS — G35 Multiple sclerosis: Secondary | ICD-10-CM

## 2023-06-30 DIAGNOSIS — O0990 Supervision of high risk pregnancy, unspecified, unspecified trimester: Secondary | ICD-10-CM

## 2023-06-30 DIAGNOSIS — O99210 Obesity complicating pregnancy, unspecified trimester: Secondary | ICD-10-CM | POA: Diagnosis not present

## 2023-06-30 NOTE — Progress Notes (Signed)
   PRENATAL VISIT NOTE  Subjective:  Stephanie Cabrera is a 23 y.o. G3P1011 at [redacted]w[redacted]d being seen today for ongoing prenatal care.  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  Patient reports no complaints.  Contractions: Not present. Vag. Bleeding: None.  Movement: Present. Denies leaking of fluid.   The following portions of the patient's history were reviewed and updated as appropriate: allergies, current medications, past family history, past medical history, past social history, past surgical history and problem list.   Objective:   Vitals:   06/30/23 0933  BP: 132/80  Pulse: 81  Weight: 256 lb (116.1 kg)    Fetal Status: Fetal Heart Rate (bpm): 140 Fundal Height: 28 cm Movement: Present     General:  Alert, oriented and cooperative. Patient is in no acute distress.  Skin: Skin is warm and dry. No rash noted.   Cardiovascular: Normal heart rate noted  Respiratory: Normal respiratory effort, no problems with respiration noted  Abdomen: Soft, gravid, appropriate for gestational age.  Pain/Pressure: Absent     Pelvic: Cervical exam deferred        Extremities: Normal range of motion.     Mental Status: Normal mood and affect. Normal behavior. Normal judgment and thought content.   Assessment and Plan:  Pregnancy: G3P1011 at [redacted]w[redacted]d 1. Supervision of high risk pregnancy, antepartum (Primary) Patient is doing well without complaints Third trimester labs rescheduled for Friday  2. Obesity affecting pregnancy, antepartum, unspecified obesity type Patient has not started ASA  3. MS (multiple sclerosis) (HCC) Stable without complaints  Preterm labor symptoms and general obstetric precautions including but not limited to  vaginal bleeding, contractions, leaking of fluid and fetal movement were reviewed in detail with the patient. Please refer to After Visit Summary for other counseling recommendations.   Return in about 2 weeks (around 07/14/2023) for in person, ROB, High risk.  Future Appointments  Date Time Provider Department Center  07/04/2023  8:30 AM [facility] None  07/10/2023 11:30 AM Van Clines, MD [facility] None    Catalina Antigua, MD

## 2023-07-01 NOTE — Telephone Encounter (Signed)
 Joni Reining from [employer] called and stated that she spoke to the patient and was told that the patient is pregnant and plans to breastfeed. Joni Reining will follow up with the patient and with [place] again in 6 months.  Nicoles phone number - [phone] Ext. [phone]

## 2023-07-04 ENCOUNTER — Other Ambulatory Visit

## 2023-07-04 DIAGNOSIS — Z3A27 27 weeks gestation of pregnancy: Secondary | ICD-10-CM

## 2023-07-04 DIAGNOSIS — O0990 Supervision of high risk pregnancy, unspecified, unspecified trimester: Secondary | ICD-10-CM

## 2023-07-05 LAB — CBC
Hematocrit: 38.2 % (ref 34.0–46.6)
Hemoglobin: 12.6 g/dL (ref 11.1–15.9)
MCH: 28.8 pg (ref 26.6–33.0)
MCHC: 33 g/dL (ref 31.5–35.7)
MCV: 87 fL (ref 79–97)
Platelets: 275 10*3/uL (ref 150–450)
RBC: 4.37 x10E6/uL (ref 3.77–5.28)
RDW: 12.9 % (ref 11.7–15.4)
WBC: 10.4 10*3/uL (ref 3.4–10.8)

## 2023-07-05 LAB — GLUCOSE TOLERANCE, 2 HOURS W/ 1HR
Glucose, 1 hour: 178 mg/dL (ref 70–179)
Glucose, 2 hour: 133 mg/dL (ref 70–152)
Glucose, Fasting: 71 mg/dL (ref 70–91)

## 2023-07-05 LAB — RPR: RPR Ser Ql: NONREACTIVE

## 2023-07-05 LAB — HIV ANTIBODY (ROUTINE TESTING W REFLEX): HIV Screen 4th Generation wRfx: NONREACTIVE

## 2023-07-10 ENCOUNTER — Ambulatory Visit (INDEPENDENT_AMBULATORY_CARE_PROVIDER_SITE_OTHER): Payer: Managed Care, Other (non HMO) | Admitting: Neurology

## 2023-07-10 ENCOUNTER — Encounter: Payer: Self-pay | Admitting: Neurology

## 2023-07-10 VITALS — BP 107/73 | HR 88 | Ht 67.0 in | Wt 256.6 lb

## 2023-07-10 DIAGNOSIS — G35 Multiple sclerosis: Secondary | ICD-10-CM

## 2023-07-10 DIAGNOSIS — G40009 Localization-related (focal) (partial) idiopathic epilepsy and epileptic syndromes with seizures of localized onset, not intractable, without status epilepticus: Secondary | ICD-10-CM

## 2023-07-10 NOTE — Patient Instructions (Signed)
 Always good to see you. Have you vitamin D level checked with your OB on next blood draw. Follow-up in 3-4 months, call for any changes.    Seizure Precautions: 1. If medication has been prescribed for you to prevent seizures, take it exactly as directed.  Do not stop taking the medicine without talking to your doctor first, even if you have not had a seizure in a long time.   2. Avoid activities in which a seizure would cause danger to yourself or to others.  Don't operate dangerous machinery, swim alone, or climb in high or dangerous places, such as on ladders, roofs, or girders.  Do not drive unless your doctor says you may.  3. If you have any warning that you may have a seizure, lay down in a safe place where you can't hurt yourself.    4.  No driving for 6 months from last seizure, as per [facility].   Please refer to the following link on the Epilepsy Foundation of America's website for more information: http://www.epilepsyfoundation.org/answerplace/Social/driving/drivingu.cfm   5.  Maintain good sleep hygiene.  6.  Notify your neurology if you are planning pregnancy or if you become pregnant.  7.  Contact your doctor if you have any problems that may be related to the medicine you are taking.  8.  Call 911 and bring the patient back to the ED if:        A.  The seizure lasts longer than 5 minutes.       B.  The patient doesn't awaken shortly after the seizure  C.  The patient has new problems such as difficulty seeing, speaking or moving  D.  The patient was injured during the seizure  E.  The patient has a temperature over 102 F (39C)  F.  The patient vomited and now is having trouble breathing

## 2023-07-10 NOTE — Progress Notes (Signed)
 NEUROLOGY FOLLOW UP OFFICE NOTE  Stephanie Cabrera [id number] 2000-07-01  HISTORY OF PRESENT ILLNESS: I had the pleasure of seeing Stephanie Cabrera in follow-up in the neurology clinic on 07/10/2023.  The patient was last seen 6 months ago for MS and seizures. She is alone in the office today. Since her last visit, she contacted our office that she was pregnant and Ocrevus infusion for 02/2023 was cancelled. Last infusion was in 08/2022. She is doing well and denies any symptoms concerning for MS flare. She has chronic intermittent right hand tingling which is stable. She denies any new focal numbness/tingling/weakness, vision changes, no headaches, dizziness. No falls. No seizures since 2021, she has been off oxcarbazepine since 2022. She is currently [redacted] weeks pregnant with a baby boy, Stephanie Cabrera. She plans to breastfeed. Her vitamin D level was low in 06/2022, she did not take replacement therapy at that time and was given another course in September, however she is not sure if she completed the course.   Last brain MRI in 04/2021 did not show any new lesions, many had regressed in conspicuity with resolution of enhancing lesions.  Vitamin D level in 06/2022 17.09   History on Initial Assessment 11/02/2019: This is a pleasant 23 year old right-handed woman with a history of anxiety presenting for evaluation of seizure-like activity that occurred on 09/02/2019. She was in her usual state of health until 09/02/19 when she recalls sitting on the commode then waking up in the ambulance. Her mother reports that she was tired and took 4 tablets of melatonin 3mg  which her mother thought was too much. She increased her water intake and was in the bathroom when she called her mother saying her right hand was going numb, then she started losing consciousness and fell off the toilet. Her mother laid her on the ground and saw her whole body stiffening and shaking for 3 minutes. She bit her tongue. When EMS  arrived, she could say her name and follow instructions, no focal weakness. She left the ER AMA. She saw her PCP on 09/15/19 and reported increassed stress worse after her baby was born in 08/2018. A week later, she was brought to the ER on 6/17 for IVC because she started destroying her TV and lamp in her bedroom. Family walked into the room and she was naked on her dresser with items around her. Family calmed her down and she indicated no memory of the event. According to IVC paperwork, this has happened 2 times previously, she had told them she has been speaking to God. She had not been sleeping well. Head CT no acute changes. 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 Per notes, she had been having auditory hallucinations for several weeks to months, however her mother states she started hallucinating after the seizure in May. She went back to [facility] on 7/8 due to recurrence of auditory hallucinations. Dose increased to 4mg  qhs yesterday. She denies any further hallucinations. She states her mood is good, she denies any depression or anxiety. Her mother reports that when the seizure occurred, her anxiety was bad. 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 She is now sleeping better.   She has had constant numbness in her hands since she gave birth a year ago, R>L. She denies any headaches, dizziness, diplopia, neck/back pain, bowel/bladder dysfunction. She  has occasional episodes of a metallic taste in her mouth for a few minutes that occur a couple of times a month. She has occasional jerks in her back. Her mother denies any staring/unresponsive episodes. She lives with her mother and 31 year old daughter Stephanie Cabrera. Her mother feels she is back to baseline and is mostly worried about her memory. Since the seizure, she cannot remember where she puts things. Her mother administers her  medication. She has not been driving. She denies any alcohol or illicit drug use.   Epilepsy Risk Factors:  Her paternal grandfather had seizures. Otherwise she had a normal birth and early development.  There is no history of febrile convulsions, CNS infections such as meningitis/encephalitis, significant traumatic brain injury, neurosurgical procedures.  Update 02/04/20: She had another seizure that occurred in her sleep at 3am last 01/12/20. Her mother heard her yell out and found her having a GTC that lasted less than a minute. She was confused after, no focal weakness. She was started on oxcarbazepine 300mg  BID. She has since also finished a 3-day course of IV Solumedrol. She presents to discuss MRI cervical and thoracic spine with and without contrast done 02/02/20. The cervical cord appears mildly expanded with diffuse heterogeneous T2 signal from C1 through C5, no abnormal enhancement. There is minimal heterogeneity in the proximal thoracic cord with inferior extension to the T3-4 disc space level, no abnormal enhancement.   Routine and 24-hour EEG in 11/2019 which were abnormal due to focal slowing over the bilateral temporal regions, right greater than left. There was also note of frontal intermittent rhythmic delta activity (FIRDA). No epileptiform discharges seen, typical events not captured.  MRI brain with and without contrast in 01/2020 showed numerous white matter lesions, many are periventricular, some are subcortical and deep white matter. There is a 10x10mm lesion in the left brachium pontis with additional smaller lesions in the brachium pontis bilaterally. There was some restricted diffusion in the left mid-frontal lobe, multiple enhancing lesions in the anterior frontal lobes bilaterally, right middle frontal lobe, right parietal periventricular white matter. Findings felt compatible with multiple sclerosis. MRI cervical and thoracic spine with and without contrast done 02/02/20. The  cervical cord appears mildly expanded with diffuse heterogeneous T2 signal from C1 through C5, no abnormal enhancement. There is minimal heterogeneity in the proximal thoracic cord with inferior extension to the T3-4 disc space level, no abnormal enhancement.    PAST MEDICAL HISTORY: Past Medical History:  Diagnosis Date   Allergy    Multiple sclerosis (HCC) 2021   Seizures (HCC)     MEDICATIONS: Current Outpatient Medications on File Prior to Visit  Medication Sig Dispense Refill   prenatal vitamin w/FE, FA (NATACHEW) 29-1 MG CHEW chewable tablet Chew 1 tablet by mouth daily at 12 noon. 30 tablet 12   aspirin EC 81 MG tablet Take 1 tablet (81 mg total) by mouth daily. Take after 12 weeks for prevention of preeclampsia later in pregnancy (Patient not taking: Reported on 07/10/2023) 300 tablet 2   promethazine (PHENERGAN) 25 MG tablet Take 1 tablet (25 mg total) by mouth every 6 (six) hours as needed for nausea or vomiting. (Patient not taking: Reported on 07/10/2023) 30 tablet 2   Vitamin D, Ergocalciferol, (DRISDOL) 1.25 MG (50000 UNIT) CAPS capsule Take 1 capsule once a week for 8 weeks (Patient not taking: Reported on 03/13/2023) 8 capsule 0   Current Facility-Administered Medications on File Prior to Visit  Medication Dose Route Frequency Provider Last Rate Last  Admin   acetaminophen (TYLENOL) tablet 650 mg  650 mg Oral Once Van Clines, MD       diphenhydrAMINE (BENADRYL) capsule 50 mg  50 mg Oral Once Van Clines, MD       methylPREDNISolone sodium succinate (SOLU-MEDROL) 125 mg/2 mL injection 125 mg  125 mg Intravenous Once Van Clines, MD       ocrelizumab (OCREVUS) 600 mg in sodium chloride 0.9 % 500 mL  600 mg Intravenous Once Van Clines, MD        ALLERGIES: No Known Allergies  FAMILY HISTORY: Family History  Problem Relation Age of Onset   Diabetes Paternal Grandmother    Diabetes Paternal Grandfather    Hypertension Paternal Grandfather     SOCIAL  HISTORY: Social History   Socioeconomic History   Marital status: Single    Spouse name: Not on file   Number of children: 1   Years of education: Not on file   Highest education level: GED or equivalent  Occupational History   Not on file  Tobacco Use   Smoking status: Never   Smokeless tobacco: Never  Vaping Use   Vaping status: Never Used  Substance and Sexual Activity   Alcohol use: Not Currently   Drug use: Never   Sexual activity: Yes    Partners: Male    Birth control/protection: None  Other Topics Concern   Not on file  Social History Narrative   Right Handed   One Story    Lives with mom and daughter   No Caffeine   Social Drivers of Corporate investment banker Strain: Low Risk  (11/09/2021)   Overall Financial Resource Strain (CARDIA)    Difficulty of Paying Living Expenses: Not hard at all  Food Insecurity: No Food Insecurity (11/09/2021)   Hunger Vital Sign    Worried About Running Out of Food in the Last Year: Never true    Ran Out of Food in the Last Year: Never true  Transportation Needs: No Transportation Needs (11/09/2021)   PRAPARE - Administrator, Civil Service (Medical): No    Lack of Transportation (Non-Medical): No  Physical Activity: Sufficiently Active (11/09/2021)   Exercise Vital Sign    Days of Exercise per Week: 5 days    Minutes of Exercise per Session: 30 min  Stress: Not on file (02/13/2023)  Social Connections: Unknown (09/06/2022)   Received from [facility]   Social Network    Social Network: Not on file  Intimate Partner Violence: Unknown (09/06/2022)   Received from [facility], [facility]   HITS    Physically Hurt: Not on file    Insult or Talk Down To: Not on file    Threaten Physical Harm: Not on file    Scream or Curse: Not on file     PHYSICAL EXAM: Vitals:   07/10/23 1135  BP: 107/73  Pulse: 88  SpO2: 99%   General: No acute distress Head:  Normocephalic/atraumatic Skin/Extremities:  No rash, no edema Neurological Exam: alert and awake. No aphasia or dysarthria. Fund of knowledge is appropriate.  Attention and concentration are normal.   Cranial nerves: Pupils equal, round. Extraocular movements intact with no nystagmus. Visual fields full.  No facial asymmetry.  Motor: Bulk and tone normal, muscle strength 5/5 throughout with no pronator drift. Sensation intact to temperature. Reflexes +1 throughout. Finger to nose testing intact.  Gait narrow-based and steady, able to tandem walk adequately.  Romberg negative.   IMPRESSION: This is a pleasant 23 yo RH woman with a history of anxiety, new onset seizures in 2021, MRI brain at that time showed changes consistent with MS, confirmed by lumbar puncture. She was having auditory hallucinations, these have resolved. She self-discontinued oxcarbazepine in 2022 with no seizure recurrence since 2021. No MS flares, Ocrevus on hold due to current pregnancy. Last infusion was in 08/2022. We discussed that MS activity typically decreases during pregnancy.After childbirth, the risk of MS relapses tends to increase. She plans to breastfeed. Continue to monitor symptoms. She has not done interval MRI, last MRI in 04/2021 stable. Vitamin D level has been low, she wants to hold off on rechecking and wants to have it done with next OB blood draw. She is aware of [place] driving laws to stop driving after a seizure until 6 months seizure-free. Follow-up in 3-4 months, call for any changes.   Thank you for allowing me to participate in her care.  Please do not hesitate to call for any questions or concerns.    Patrcia Dolly, M.D.   CC: Dr. [place]

## 2023-07-14 ENCOUNTER — Encounter: Admitting: Obstetrics and Gynecology

## 2023-07-17 ENCOUNTER — Ambulatory Visit (INDEPENDENT_AMBULATORY_CARE_PROVIDER_SITE_OTHER): Admitting: Obstetrics and Gynecology

## 2023-07-17 ENCOUNTER — Encounter: Payer: Self-pay | Admitting: Obstetrics and Gynecology

## 2023-07-17 VITALS — BP 121/76 | HR 93 | Wt 255.4 lb

## 2023-07-17 DIAGNOSIS — O0990 Supervision of high risk pregnancy, unspecified, unspecified trimester: Secondary | ICD-10-CM

## 2023-07-17 DIAGNOSIS — G35 Multiple sclerosis: Secondary | ICD-10-CM

## 2023-07-17 DIAGNOSIS — O99210 Obesity complicating pregnancy, unspecified trimester: Secondary | ICD-10-CM | POA: Diagnosis not present

## 2023-07-17 NOTE — Progress Notes (Signed)
   PRENATAL VISIT NOTE  Subjective:  Stephanie Cabrera is a 23 y.o. G3P1011 at [redacted]w[redacted]d being seen today for ongoing prenatal care.  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  Patient reports no complaints.  Contractions: Not present. Vag. Bleeding: None.  Movement: Present. Denies leaking of fluid.   The following portions of the patient's history were reviewed and updated as appropriate: allergies, current medications, past family history, past medical history, past social history, past surgical history and problem list.   Objective:   Vitals:   07/17/23 1531  BP: 121/76  Pulse: 93  Weight: 255 lb 6.4 oz (115.8 kg)    Fetal Status: Fetal Heart Rate (bpm): 141   Movement: Present     General:  Alert, oriented and cooperative. Patient is in no acute distress.  Skin: Skin is warm and dry. No rash noted.   Cardiovascular: Normal heart rate noted  Respiratory: Normal respiratory effort, no problems with respiration noted  Abdomen: Soft, gravid, appropriate for gestational age.  Pain/Pressure: Absent     Pelvic: Cervical exam deferred        Extremities: Normal range of motion.  Edema: None  Mental Status: Normal mood and affect. Normal behavior. Normal judgment and thought content.   Assessment and Plan:  Pregnancy: G3P1011 at [redacted]w[redacted]d 1. Supervision of high risk pregnancy, antepartum (Primary) Patient is doing well without complaints Reviewed ultrasound report  2. Obesity affecting pregnancy, antepartum, unspecified obesity type Patient non complaint with ASA Follow up growth to be scheduled  3. MS (multiple sclerosis) (HCC) Stable  Preterm labor symptoms and general obstetric precautions including but not limited to  vaginal bleeding, contractions, leaking of fluid and fetal movement were reviewed in detail with the patient. Please refer to After Visit Summary for other counseling recommendations.   Return in about 2 weeks (around 07/31/2023) for in person, ROB, High risk.  Future Appointments  Date Time Provider Department Center  10/21/2023 11:30 AM Van Clines, MD [facility] None    Catalina Antigua, MD

## 2023-07-17 NOTE — Progress Notes (Signed)
 Pt presents for ROB visit. No concerns

## 2023-07-24 ENCOUNTER — Ambulatory Visit: Payer: Self-pay | Admitting: Neurology

## 2023-08-01 ENCOUNTER — Encounter: Admitting: Obstetrics

## 2023-08-11 ENCOUNTER — Ambulatory Visit (INDEPENDENT_AMBULATORY_CARE_PROVIDER_SITE_OTHER): Admitting: Obstetrics

## 2023-08-11 VITALS — BP 113/73 | HR 87 | Wt 250.8 lb

## 2023-08-11 DIAGNOSIS — G35 Multiple sclerosis: Secondary | ICD-10-CM | POA: Diagnosis not present

## 2023-08-11 DIAGNOSIS — O26843 Uterine size-date discrepancy, third trimester: Secondary | ICD-10-CM

## 2023-08-11 DIAGNOSIS — J301 Allergic rhinitis due to pollen: Secondary | ICD-10-CM

## 2023-08-11 DIAGNOSIS — O0990 Supervision of high risk pregnancy, unspecified, unspecified trimester: Secondary | ICD-10-CM

## 2023-08-11 DIAGNOSIS — O99210 Obesity complicating pregnancy, unspecified trimester: Secondary | ICD-10-CM

## 2023-08-11 MED ORDER — LORATADINE 10 MG PO TABS: 10.0000 mg | ORAL_TABLET | Freq: Every day | ORAL | 11 refills | Status: AC

## 2023-08-11 NOTE — Progress Notes (Signed)
 Pt presents for rob. Pt has no questions or concerns at this time.

## 2023-08-11 NOTE — Progress Notes (Signed)
 Subjective:  Stephanie Cabrera is a 23 y.o. G3P1011 at [redacted]w[redacted]d being seen today for ongoing prenatal care.  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  Patient reports no complaints.  Contractions: Irritability. Vag. Bleeding: None.  Movement: Present. Denies leaking of fluid.   The following portions of the patient's history were reviewed and updated as appropriate: allergies, current medications, past family history, past medical history, past social history, past surgical history and problem list. Problem list updated.  Objective:   Vitals:   08/11/23 0831  BP: 113/73  Pulse: 87  Weight: 250 lb 12.8 oz (113.8 kg)    Fetal Status: Fetal Heart Rate (bpm): 153   Movement: Present     General:  Alert, oriented and cooperative. Patient is in no acute distress.  Skin: Skin is warm and dry. No rash noted.   Cardiovascular: Normal heart rate noted  Respiratory: Normal respiratory effort, no problems with respiration noted  Abdomen: Soft, gravid, appropriate for gestational age. Pain/Pressure: Absent     Pelvic:  Cervical exam deferred        Extremities: Normal range of motion.  Edema: None  Mental Status: Normal mood and affect. Normal behavior. Normal judgment and thought content.   Urinalysis:      Assessment and Plan:  Pregnancy: G3P1011 at [redacted]w[redacted]d  1. Supervision of high risk pregnancy, antepartum (Primary)  2. MS (multiple sclerosis) (HCC) - clinically stable  3. Obesity affecting pregnancy, antepartum, unspecified obesity type  4. Size of fetus inconsistent with dates in third trimester Rx: - US  MFM OB FOLLOW UP; Future  5. Seasonal allergic rhinitis due to pollen Rx: - loratadine (CLARITIN) 10 MG tablet; Take 1  tablet (10 mg total) by mouth daily.  Dispense: 30 tablet; Refill: 11    Preterm labor symptoms and general obstetric precautions including but not limited to vaginal bleeding, contractions, leaking of fluid and fetal movement were reviewed in detail with the patient. Please refer to After Visit Summary for other counseling recommendations.   Return in about 2 weeks (around 08/25/2023) for ROB.   Gabrielle Joiner, MD 08/11/2023

## 2023-08-14 ENCOUNTER — Ambulatory Visit (HOSPITAL_BASED_OUTPATIENT_CLINIC_OR_DEPARTMENT_OTHER): Admitting: Obstetrics

## 2023-08-14 ENCOUNTER — Ambulatory Visit: Attending: Obstetrics

## 2023-08-14 VITALS — BP 123/71 | HR 88

## 2023-08-14 DIAGNOSIS — G35 Multiple sclerosis: Secondary | ICD-10-CM

## 2023-08-14 DIAGNOSIS — O99210 Obesity complicating pregnancy, unspecified trimester: Secondary | ICD-10-CM

## 2023-08-14 DIAGNOSIS — Z3A33 33 weeks gestation of pregnancy: Secondary | ICD-10-CM

## 2023-08-14 DIAGNOSIS — E669 Obesity, unspecified: Secondary | ICD-10-CM

## 2023-08-14 DIAGNOSIS — O26843 Uterine size-date discrepancy, third trimester: Secondary | ICD-10-CM | POA: Diagnosis present

## 2023-08-14 DIAGNOSIS — O99213 Obesity complicating pregnancy, third trimester: Secondary | ICD-10-CM | POA: Insufficient documentation

## 2023-08-14 DIAGNOSIS — O0990 Supervision of high risk pregnancy, unspecified, unspecified trimester: Secondary | ICD-10-CM | POA: Insufficient documentation

## 2023-08-14 DIAGNOSIS — O99353 Diseases of the nervous system complicating pregnancy, third trimester: Secondary | ICD-10-CM | POA: Diagnosis not present

## 2023-08-14 NOTE — Progress Notes (Signed)
 MFM Consult Note  Stephanie Cabrera. [name] is currently at 33 weeks and 3 days.  She has been followed due to maternal obesity with a BMI of 40 and history of multiple sclerosis.    She denies any problems since her last exam.  On today's exam, the overall EFW of 5 pounds 8 ounces measures at the 79th percentile for her gestational age.    There was normal amniotic fluid noted with a total AFI of 13.39 cm.    The patient was advised that based on the EFW obtained today, her baby will most likely weigh between 8 to 8-1/2 pounds should she deliver at between 39 to 40 weeks.    As her BMI is greater than 40, she should start weekly fetal testing in your office at 34 weeks ( next week).    Delivery may be considered at any time at 39 weeks or greater.    No further exams were scheduled in our office.    The patient stated that all of her questions were answered today.  A total of 20 minutes was spent counseling and coordinating the care for this patient.  Greater than 50% of the time was spent in direct face-to-face contact.

## 2023-08-18 ENCOUNTER — Encounter: Admitting: Obstetrics and Gynecology

## 2023-08-22 ENCOUNTER — Ambulatory Visit

## 2023-08-22 VITALS — BP 124/77 | HR 79

## 2023-08-22 DIAGNOSIS — O99353 Diseases of the nervous system complicating pregnancy, third trimester: Secondary | ICD-10-CM | POA: Diagnosis not present

## 2023-08-22 DIAGNOSIS — O26843 Uterine size-date discrepancy, third trimester: Secondary | ICD-10-CM | POA: Diagnosis not present

## 2023-08-22 DIAGNOSIS — Z3A33 33 weeks gestation of pregnancy: Secondary | ICD-10-CM

## 2023-08-22 DIAGNOSIS — Z3689 Encounter for other specified antenatal screening: Secondary | ICD-10-CM

## 2023-08-22 NOTE — Progress Notes (Signed)
 Pt presents for weekly NST per MFM recs. NST charted, reviewed by Dr. Willey Harrier. Reactive and reassuring for GA.

## 2023-08-25 ENCOUNTER — Encounter: Admitting: Obstetrics and Gynecology

## 2023-08-25 DIAGNOSIS — O0990 Supervision of high risk pregnancy, unspecified, unspecified trimester: Secondary | ICD-10-CM

## 2023-08-25 DIAGNOSIS — Z3A35 35 weeks gestation of pregnancy: Secondary | ICD-10-CM

## 2023-09-09 ENCOUNTER — Encounter: Payer: Self-pay | Admitting: Neurology

## 2023-09-09 ENCOUNTER — Encounter: Payer: Self-pay | Admitting: Obstetrics and Gynecology

## 2023-09-15 ENCOUNTER — Encounter: Admitting: Obstetrics

## 2023-09-16 ENCOUNTER — Encounter: Payer: Self-pay | Admitting: Advanced Practice Midwife

## 2023-09-16 ENCOUNTER — Ambulatory Visit: Admitting: Advanced Practice Midwife

## 2023-09-16 VITALS — BP 128/82 | HR 88 | Wt 265.6 lb

## 2023-09-16 DIAGNOSIS — Z3A38 38 weeks gestation of pregnancy: Secondary | ICD-10-CM | POA: Diagnosis not present

## 2023-09-16 DIAGNOSIS — O0990 Supervision of high risk pregnancy, unspecified, unspecified trimester: Secondary | ICD-10-CM | POA: Diagnosis not present

## 2023-09-16 DIAGNOSIS — O99210 Obesity complicating pregnancy, unspecified trimester: Secondary | ICD-10-CM | POA: Diagnosis not present

## 2023-09-16 DIAGNOSIS — G35 Multiple sclerosis: Secondary | ICD-10-CM | POA: Diagnosis not present

## 2023-09-16 NOTE — Progress Notes (Signed)
   PRENATAL VISIT NOTE  Subjective:  Stephanie Cabrera is a 23 y.o. G3P1011 at [redacted]w[redacted]d being seen today for ongoing prenatal care.  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  Patient reports occasional contractions.  Contractions: Not present.  .  Movement: Present. Denies leaking of fluid.   The following portions of the patient's history were reviewed and updated as appropriate: allergies, current medications, past family history, past medical history, past social history, past surgical history and problem list.   Objective:    Vitals:   09/16/23 1005  BP: 128/82  Pulse: 88  Weight: 265 lb 9.6 oz (120.5 kg)    Fetal Status:  Fetal Heart Rate (bpm): 120   Movement: Present    General: Alert, oriented and cooperative. Patient is in no acute distress.  Skin: Skin is warm and dry. No rash noted.   Cardiovascular: Normal heart rate noted  Respiratory: Normal respiratory effort, no problems with respiration noted  Abdomen: Soft, gravid, appropriate for gestational age.  Pain/Pressure: Absent     Pelvic: Cervical exam deferred        Extremities: Normal range of motion.  Edema: Trace  Mental Status: Normal mood and affect. Normal behavior. Normal judgment and thought content.   Assessment and Plan:  Pregnancy: G3P1011 at [redacted]w[redacted]d  1. Supervision of high risk pregnancy, antepartum (Primary) --Anticipatory guidance about next visits/weeks of pregnancy given.  --Reviewed labor readiness with patient including the [facility], evening primrose oil, and raspberry leaf tea.    2. Obesity affecting pregnancy, antepartum, unspecified obesity type --Weekly BPPs ordered, pt has missed due to third shift work.  --Pt willing to schedule,  if possible, and have BPPs until delivery --Delivery between 39.0 and 40.6 per MFM guidelines. Pt prefers to wait and let labor begin on its own. Ok with scheduling [redacted]w[redacted]d IOL if needed.  --Reactive NST today, pt with good fetal movement  3. [redacted] weeks gestation of pregnancy   4. MS (multiple sclerosis) (HCC)  --No current complications, f/u scheduled  Term labor symptoms and general obstetric precautions including but not limited to vaginal bleeding, contractions, leaking of fluid and fetal movement were reviewed in detail with the patient. Please refer to After Visit Summary for other counseling recommendations.   Return in about 1 week (around 09/23/2023) for LOB.  Future Appointments  Date Time Provider Department Center  09/24/2023 10:55 AM Gabrielle Joiner, MD [facility] None  10/21/2023 11:30 AM Jhonny Moss, MD [facility] None    Arlester Bence, CNM

## 2023-09-16 NOTE — Progress Notes (Signed)
 HR ROB 38.1 wks Weekly NST requested by MFM Pt has not been seen since 08/22/23

## 2023-09-24 ENCOUNTER — Encounter: Admitting: Obstetrics

## 2023-09-25 ENCOUNTER — Inpatient Hospital Stay (HOSPITAL_COMMUNITY): Admitting: Anesthesiology

## 2023-09-25 ENCOUNTER — Inpatient Hospital Stay (HOSPITAL_BASED_OUTPATIENT_CLINIC_OR_DEPARTMENT_OTHER)

## 2023-09-25 ENCOUNTER — Ambulatory Visit: Admitting: Obstetrics

## 2023-09-25 ENCOUNTER — Encounter (HOSPITAL_COMMUNITY): Payer: Self-pay | Admitting: Obstetrics and Gynecology

## 2023-09-25 ENCOUNTER — Inpatient Hospital Stay (HOSPITAL_COMMUNITY)
Admission: AD | Admit: 2023-09-25 | Discharge: 2023-09-27 | DRG: 806 | Disposition: A | Discharge status: Home / Self Care | Attending: Obstetrics & Gynecology | Admitting: Obstetrics & Gynecology

## 2023-09-25 ENCOUNTER — Other Ambulatory Visit: Payer: Self-pay

## 2023-09-25 VITALS — BP 132/94 | HR 74 | Wt 264.0 lb

## 2023-09-25 DIAGNOSIS — O99354 Diseases of the nervous system complicating childbirth: Secondary | ICD-10-CM | POA: Diagnosis present

## 2023-09-25 DIAGNOSIS — O99824 Streptococcus B carrier state complicating childbirth: Secondary | ICD-10-CM | POA: Diagnosis present

## 2023-09-25 DIAGNOSIS — G35 Multiple sclerosis: Secondary | ICD-10-CM | POA: Diagnosis present

## 2023-09-25 DIAGNOSIS — Z833 Family history of diabetes mellitus: Secondary | ICD-10-CM

## 2023-09-25 DIAGNOSIS — E66813 Obesity, class 3: Secondary | ICD-10-CM | POA: Diagnosis present

## 2023-09-25 DIAGNOSIS — O0990 Supervision of high risk pregnancy, unspecified, unspecified trimester: Secondary | ICD-10-CM

## 2023-09-25 DIAGNOSIS — O99353 Diseases of the nervous system complicating pregnancy, third trimester: Secondary | ICD-10-CM

## 2023-09-25 DIAGNOSIS — O99210 Obesity complicating pregnancy, unspecified trimester: Secondary | ICD-10-CM | POA: Diagnosis not present

## 2023-09-25 DIAGNOSIS — O99214 Obesity complicating childbirth: Secondary | ICD-10-CM | POA: Diagnosis present

## 2023-09-25 DIAGNOSIS — Z3A39 39 weeks gestation of pregnancy: Secondary | ICD-10-CM

## 2023-09-25 DIAGNOSIS — O288 Other abnormal findings on antenatal screening of mother: Secondary | ICD-10-CM

## 2023-09-25 DIAGNOSIS — O289 Unspecified abnormal findings on antenatal screening of mother: Secondary | ICD-10-CM | POA: Diagnosis not present

## 2023-09-25 DIAGNOSIS — O99213 Obesity complicating pregnancy, third trimester: Secondary | ICD-10-CM

## 2023-09-25 DIAGNOSIS — E669 Obesity, unspecified: Secondary | ICD-10-CM

## 2023-09-25 DIAGNOSIS — O9902 Anemia complicating childbirth: Secondary | ICD-10-CM | POA: Diagnosis present

## 2023-09-25 DIAGNOSIS — O134 Gestational [pregnancy-induced] hypertension without significant proteinuria, complicating childbirth: Principal | ICD-10-CM | POA: Diagnosis present

## 2023-09-25 DIAGNOSIS — O99344 Other mental disorders complicating childbirth: Secondary | ICD-10-CM | POA: Diagnosis not present

## 2023-09-25 DIAGNOSIS — O26843 Uterine size-date discrepancy, third trimester: Secondary | ICD-10-CM

## 2023-09-25 DIAGNOSIS — Z8249 Family history of ischemic heart disease and other diseases of the circulatory system: Secondary | ICD-10-CM | POA: Diagnosis not present

## 2023-09-25 DIAGNOSIS — R030 Elevated blood-pressure reading, without diagnosis of hypertension: Secondary | ICD-10-CM

## 2023-09-25 DIAGNOSIS — K219 Gastro-esophageal reflux disease without esophagitis: Secondary | ICD-10-CM | POA: Diagnosis present

## 2023-09-25 DIAGNOSIS — O368330 Maternal care for abnormalities of the fetal heart rate or rhythm, third trimester, not applicable or unspecified: Secondary | ICD-10-CM | POA: Diagnosis not present

## 2023-09-25 DIAGNOSIS — O139 Gestational [pregnancy-induced] hypertension without significant proteinuria, unspecified trimester: Secondary | ICD-10-CM | POA: Diagnosis present

## 2023-09-25 DIAGNOSIS — O9962 Diseases of the digestive system complicating childbirth: Secondary | ICD-10-CM | POA: Diagnosis present

## 2023-09-25 DIAGNOSIS — O99820 Streptococcus B carrier state complicating pregnancy: Secondary | ICD-10-CM | POA: Diagnosis not present

## 2023-09-25 DIAGNOSIS — F060 Psychotic disorder with hallucinations due to known physiological condition: Secondary | ICD-10-CM | POA: Diagnosis present

## 2023-09-25 LAB — CBC WITH DIFFERENTIAL/PLATELET
Abs Immature Granulocytes: 0.03 10*3/uL (ref 0.00–0.07)
Basophils Absolute: 0 10*3/uL (ref 0.0–0.1)
Basophils Relative: 0 %
Eosinophils Absolute: 0.1 10*3/uL (ref 0.0–0.5)
Eosinophils Relative: 1 %
HCT: 35.5 % — ABNORMAL LOW (ref 36.0–46.0)
Hemoglobin: 11.7 g/dL — ABNORMAL LOW (ref 12.0–15.0)
Immature Granulocytes: 0 %
Lymphocytes Relative: 19 %
Lymphs Abs: 1.8 10*3/uL (ref 0.7–4.0)
MCH: 27 pg (ref 26.0–34.0)
MCHC: 33 g/dL (ref 30.0–36.0)
MCV: 82 fL (ref 80.0–100.0)
Monocytes Absolute: 0.7 10*3/uL (ref 0.1–1.0)
Monocytes Relative: 8 %
Neutro Abs: 6.9 10*3/uL (ref 1.7–7.7)
Neutrophils Relative %: 72 %
Platelets: 274 10*3/uL (ref 150–400)
RBC: 4.33 MIL/uL (ref 3.87–5.11)
RDW: 14.2 % (ref 11.5–15.5)
WBC: 9.6 10*3/uL (ref 4.0–10.5)
nRBC: 0 % (ref 0.0–0.2)

## 2023-09-25 LAB — PROTEIN / CREATININE RATIO, URINE
Creatinine, Urine: 225 mg/dL
Protein Creatinine Ratio: 0.09 mg/mg{creat} (ref 0.00–0.15)
Total Protein, Urine: 21 mg/dL

## 2023-09-25 LAB — CBC
HCT: 36.8 % (ref 36.0–46.0)
Hemoglobin: 12 g/dL (ref 12.0–15.0)
MCH: 26.5 pg (ref 26.0–34.0)
MCHC: 32.6 g/dL (ref 30.0–36.0)
MCV: 81.4 fL (ref 80.0–100.0)
Platelets: 276 10*3/uL (ref 150–400)
RBC: 4.52 MIL/uL (ref 3.87–5.11)
RDW: 14.1 % (ref 11.5–15.5)
WBC: 10 10*3/uL (ref 4.0–10.5)
nRBC: 0 % (ref 0.0–0.2)

## 2023-09-25 LAB — COMPREHENSIVE METABOLIC PANEL WITH GFR
ALT: 19 U/L (ref 0–44)
AST: 17 U/L (ref 15–41)
Albumin: 2.5 g/dL — ABNORMAL LOW (ref 3.5–5.0)
Alkaline Phosphatase: 105 U/L (ref 38–126)
Anion gap: 8 (ref 5–15)
BUN: <5 mg/dL — ABNORMAL LOW (ref 6–20)
CO2: 20 mmol/L — ABNORMAL LOW (ref 22–32)
Calcium: 8.8 mg/dL — ABNORMAL LOW (ref 8.9–10.3)
Chloride: 107 mmol/L (ref 98–111)
Creatinine, Ser: 0.66 mg/dL (ref 0.44–1.00)
GFR, Estimated: >60 mL/min (ref >60)
Glucose, Bld: 86 mg/dL (ref 70–99)
Potassium: 3.9 mmol/L (ref 3.5–5.1)
Sodium: 135 mmol/L (ref 135–145)
Total Bilirubin: 0.5 mg/dL (ref 0.0–1.2)
Total Protein: 6.4 g/dL — ABNORMAL LOW (ref 6.5–8.1)

## 2023-09-25 LAB — TYPE AND SCREEN
ABO/RH(D): B POS
Antibody Screen: NEGATIVE

## 2023-09-25 LAB — GROUP B STREP BY PCR: Group B strep by PCR: POSITIVE — AB

## 2023-09-25 MED ORDER — OXYTOCIN-SODIUM CHLORIDE 30-0.9 UT/500ML-% IV SOLN
2.5000 [IU]/h | INTRAVENOUS | Status: DC
  Filled 2023-09-25: qty 500

## 2023-09-25 MED ORDER — LABETALOL HCL 5 MG/ML IV SOLN: 20.0000 mg | INTRAVENOUS | Status: DC | PRN

## 2023-09-25 MED ORDER — OXYTOCIN BOLUS FROM INFUSION
333.0000 mL | Freq: Once | INTRAVENOUS | Status: AC
Start: 2023-09-25 — End: 2023-09-26
  Administered 2023-09-26: 333 mL via INTRAVENOUS

## 2023-09-25 MED ORDER — ONDANSETRON HCL 4 MG/2ML IJ SOLN: 4.0000 mg | Freq: Four times a day (QID) | INTRAMUSCULAR | Status: DC | PRN

## 2023-09-25 MED ORDER — PHENYLEPHRINE 80 MCG/ML (10ML) SYRINGE FOR IV PUSH (FOR BLOOD PRESSURE SUPPORT)
80.0000 ug | PREFILLED_SYRINGE | INTRAVENOUS | Status: DC | PRN
  Filled 2023-09-25 (×2): qty 10

## 2023-09-25 MED ORDER — PENICILLIN G POT IN DEXTROSE 60000 UNIT/ML IV SOLN
3.0000 10*6.[IU] | INTRAVENOUS | Status: DC
  Administered 2023-09-25 – 2023-09-26 (×3): 3 10*6.[IU] via INTRAVENOUS
  Filled 2023-09-25 (×4): qty 50

## 2023-09-25 MED ORDER — LIDOCAINE HCL (PF) 1 % IJ SOLN: 30.0000 mL | INTRAMUSCULAR | Status: DC | PRN

## 2023-09-25 MED ORDER — SODIUM CHLORIDE 0.9 % IV SOLN
5.0000 10*6.[IU] | Freq: Once | INTRAVENOUS | Status: AC
  Administered 2023-09-25: 5 10*6.[IU] via INTRAVENOUS
  Filled 2023-09-25: qty 5

## 2023-09-25 MED ORDER — LABETALOL HCL 5 MG/ML IV SOLN: 40.0000 mg | INTRAVENOUS | Status: DC | PRN

## 2023-09-25 MED ORDER — EPHEDRINE 5 MG/ML INJ: 10.0000 mg | INTRAVENOUS | Status: DC | PRN

## 2023-09-25 MED ORDER — LACTATED RINGERS IV SOLN
500.0000 mL | Freq: Once | INTRAVENOUS | Status: DC
Start: 2023-09-25 — End: 2023-09-26

## 2023-09-25 MED ORDER — OXYTOCIN-SODIUM CHLORIDE 30-0.9 UT/500ML-% IV SOLN
1.0000 m[IU]/min | INTRAVENOUS | Status: DC
  Administered 2023-09-25: 2 m[IU]/min via INTRAVENOUS

## 2023-09-25 MED ORDER — OXYCODONE-ACETAMINOPHEN 5-325 MG PO TABS: 1.0000 | ORAL_TABLET | ORAL | Status: DC | PRN

## 2023-09-25 MED ORDER — ACETAMINOPHEN 325 MG PO TABS: 650.0000 mg | ORAL_TABLET | ORAL | Status: DC | PRN

## 2023-09-25 MED ORDER — LABETALOL HCL 5 MG/ML IV SOLN: 80.0000 mg | INTRAVENOUS | Status: DC | PRN

## 2023-09-25 MED ORDER — HYDRALAZINE HCL 20 MG/ML IJ SOLN: 10.0000 mg | INTRAMUSCULAR | Status: DC | PRN

## 2023-09-25 MED ORDER — LACTATED RINGERS IV SOLN
500.0000 mL | INTRAVENOUS | Status: DC | PRN
  Administered 2023-09-25 – 2023-09-26 (×5): 500 mL via INTRAVENOUS

## 2023-09-25 MED ORDER — OXYCODONE-ACETAMINOPHEN 5-325 MG PO TABS: 2.0000 | ORAL_TABLET | ORAL | Status: DC | PRN

## 2023-09-25 MED ORDER — PHENYLEPHRINE 80 MCG/ML (10ML) SYRINGE FOR IV PUSH (FOR BLOOD PRESSURE SUPPORT): 80.0000 ug | PREFILLED_SYRINGE | INTRAVENOUS | Status: DC | PRN

## 2023-09-25 MED ORDER — FENTANYL-BUPIVACAINE-NACL 0.5-0.125-0.9 MG/250ML-% EP SOLN
12.0000 mL/h | EPIDURAL | Status: DC | PRN
  Filled 2023-09-25: qty 250

## 2023-09-25 MED ORDER — SOD CITRATE-CITRIC ACID 500-334 MG/5ML PO SOLN: 30.0000 mL | ORAL | Status: DC | PRN

## 2023-09-25 MED ORDER — FENTANYL CITRATE (PF) 100 MCG/2ML IJ SOLN: 50.0000 ug | INTRAMUSCULAR | Status: DC | PRN

## 2023-09-25 MED ORDER — EPHEDRINE 5 MG/ML INJ
10.0000 mg | INTRAVENOUS | Status: DC | PRN
  Administered 2023-09-26: 10 mg via INTRAVENOUS
  Filled 2023-09-25: qty 5

## 2023-09-25 MED ORDER — LACTATED RINGERS IV SOLN: INTRAVENOUS | Status: DC

## 2023-09-25 MED ORDER — TERBUTALINE SULFATE 1 MG/ML IJ SOLN: 0.2500 mg | Freq: Once | INTRAMUSCULAR | Status: DC | PRN

## 2023-09-25 MED ORDER — DIPHENHYDRAMINE HCL 50 MG/ML IJ SOLN: 12.5000 mg | INTRAMUSCULAR | Status: DC | PRN

## 2023-09-25 NOTE — Anesthesia Procedure Notes (Addendum)
 Epidural Patient location during procedure: OB Start time: 09/25/2023 9:48 PM End time: 09/25/2023 9:57 PM  Staffing Anesthesiologist: Tura Gaines, MD Performed: anesthesiologist   Preanesthetic Checklist Completed: patient identified, IV checked, site marked, risks and benefits discussed, surgical consent, monitors and equipment checked, pre-op evaluation and timeout performed  Epidural Patient position: sitting Prep: DuraPrep and site prepped and draped Patient monitoring: continuous pulse ox and blood pressure Approach: midline Location: L3-L4 Injection technique: LOR air  Needle:  Needle type: Tuohy  Needle gauge: 17 G Needle length: 9 cm and 9 Needle insertion depth: 9 cm Catheter type: closed end flexible Catheter size: 19 Gauge Catheter at skin depth: 15 cm Test dose: negative and Other  Assessment Events: blood not aspirated, no cerebrospinal fluid, injection not painful, no injection resistance, no paresthesia and negative IV test  Additional Notes Patient identified. Risks and benefits discussed including failed block, incomplete  Pain control, post dural puncture headache, nerve damage, paralysis, blood pressure Changes, nausea, vomiting, reactions to medications-both toxic and allergic and post Partum back pain. All questions were answered. Patient expressed understanding and wished to proceed. Sterile technique was used throughout procedure. Epidural site was Dressed with sterile barrier dressing. No paresthesias, signs of intravascular injection Or signs of intrathecal spread were encountered.  Patient was more comfortable after the epidural was dosed. Please see RN's note for documentation of vital signs and FHR which are stable.

## 2023-09-25 NOTE — Progress Notes (Signed)
 Subjective:  Stephanie Cabrera is a 23 y.o. G3P1011 at [redacted]w[redacted]d being seen today for ongoing prenatal care.  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  Patient reports no complaints.  Contractions: Irregular. Vag. Bleeding: None.  Movement: Present. Denies leaking of fluid.   The following portions of the patient's history were reviewed and updated as appropriate: allergies, current medications, past family history, past medical history, past social history, past surgical history and problem list. Problem list updated.  Objective:   Vitals:   09/25/23 1048  BP: (!) 132/94  Pulse: 74  Weight: 264 lb (119.7 kg)    Fetal Status: Fetal Heart Rate (bpm): 140   Movement: Present     General:  Alert, oriented and cooperative. Patient is in no acute distress.  Skin: Skin is warm and dry. No rash noted.   Cardiovascular: Normal heart rate noted  Respiratory: Normal respiratory effort, no problems with respiration noted  Abdomen: Soft, gravid, appropriate for gestational age. Pain/Pressure: Present     Pelvic:  Cervical exam deferred        Extremities: Normal range of motion.  Edema: None  Mental Status: Normal mood and affect. Normal behavior. Normal judgment and thought content.   Urinalysis:      NST:  non-reactive, FHR 140-150, fair variability, no accels, decels down to 15 beat decels, irregular UC,s, mild  Assessment and Plan:  Pregnancy: G3P1011 at [redacted]w[redacted]d  1. Supervision of high risk pregnancy, antepartum (Primary)  2. MS (multiple sclerosis) (HCC) - clinically stable.  Managed by PCP  3. Obesity affecting pregnancy, antepartum, unspecified obesity type  4. Non-Reactive NST - patient sent to MAU for further  evaluation   Term labor symptoms and general obstetric precautions including but not limited to vaginal bleeding, contractions, leaking of fluid and fetal movement were reviewed in detail with the patient. Please refer to After Visit Summary for other counseling recommendations.      Gabrielle Joiner, MD 09/25/2023

## 2023-09-25 NOTE — Progress Notes (Signed)
 LABOR PROGRESS NOTE  Patient Name: Stephanie Cabrera, female   DOB: Nov 02, 2000, 22 y.o.  MRN: [id number]  Patient now comfortable s/p epidural and amenable to re-attempt at AROM. Cervix still 3/50/-3. Posterior but also markedly tilted. R/B/A of AROM discussed with patient, and verbal consent obtained. [name]'s head found to be well-applied. Obtained small clear fluid. Mom and babe tolerated well. Cat I. Titrate pitocin  as needed for adequate contractions.  Maud Sorenson, MD

## 2023-09-25 NOTE — Progress Notes (Signed)
 Labor progress note:  In to meet patient. No questions at present. Discussed IOL process w pt, recommended AROM once adequate on PCN (GBS status unknown, but positive in prior pregnancy, so treating while waiting for PCR to return) vs starting Pitocin  now. She is considering her options and will let us  know. Cat I tracing. Having some UI on monitor. Cephalic on BSUS confirmed, appears OA.   PEC w/up negative, BP have been mild to mod range. No sxs.  Has known hx MS, on Ocrevus  and f/b neuro.  ?Hx seizures: Not on antiepileptics, previously had been. Close monitoring.   Hx psychosis: Approx 16 months postpartum, required inpatient admission. Close monitoring for postpartum mood disorders.    Melanie Spires, MD OB Fellow, Faculty Practice [facility], Center for [facility]

## 2023-09-25 NOTE — Progress Notes (Signed)
 LABOR PROGRESS NOTE  Patient Name: Stephanie Cabrera, female   DOB: 19-Sep-2000, 23 y.o.  MRN: [id number]  Getting second PCN dose. In to meet patient. Discussed indication for AROM, and she is amenable. Tried AROM; patient requested to stop 2/2 discomfort. Will continue to uptitrate pitocin  and plan for AROM after epidural placement. Cat I.  Maud Sorenson, MD

## 2023-09-25 NOTE — Progress Notes (Signed)
 No swelling in feet, only in hands.

## 2023-09-25 NOTE — MAU Provider Note (Signed)
 MAU Provider Note  Chief Complaint: Fetal Monitoring  SUBJECTIVE HPI: Angelic Schnelle is a 23 y.o. G3P1011 at [redacted]w[redacted]d by early ultrasound who presents to maternity admissions reporting from office after non-reactive NST for proloned monitoring, given desire to avoid induction of labor if possible.  Ctx every few minutes this morning, that have since subsided. Denies LOF, VB, h/a, RUQ, epigastric pain, visual changes, or other concerns/complaints  Of note: in office BP today 132/94 @1048   Per last office visit: Delivery recommended between 39.0 and 40.6 per MFM guidelines. Pt prefers to wait and let labor begin on its own. Ok with scheduling [redacted]w[redacted]d IOL if needed.   Pregnancy c/b psychotic disorder due to medical condition with hallucinations, multiple sclerosis, obesity(PG BMI 39).   Receives [facility] with [facility].   HPI  Past Medical History:  Diagnosis Date   Allergy    Multiple sclerosis (HCC) 2021   Seizures (HCC)    Past Surgical History:  Procedure Laterality Date   NO PAST SURGERIES     Social History   Socioeconomic History   Marital status: Single    Spouse name: Not on file   Number of children: 1   Years of education: Not on file   Highest education level: GED or equivalent  Occupational History   Not on file  Tobacco Use   Smoking status: Never   Smokeless tobacco: Never  Vaping Use   Vaping status: Never Used  Substance and Sexual Activity   Alcohol use: Not Currently   Drug use: Never   Sexual activity: Yes    Partners: Male    Birth control/protection: None  Other Topics Concern   Not on file  Social History Narrative   Right Handed   One Story    Lives with mom and daughter   No Caffeine   Social Drivers of Corporate investment banker Strain: Low Risk  (11/09/2021)   Overall Financial Resource Strain (CARDIA)    Difficulty of Paying Living Expenses: Not hard at all  Food Insecurity: No Food Insecurity (11/09/2021)   Hunger Vital Sign     Worried About Running Out of Food in the Last Year: Never true    Ran Out of Food in the Last Year: Never true  Transportation Needs: No Transportation Needs (11/09/2021)   PRAPARE - Administrator, Civil Service (Medical): No    Lack of Transportation (Non-Medical): No  Physical Activity: Sufficiently Active (11/09/2021)   Exercise Vital Sign    Days of Exercise per Week: 5 days    Minutes of Exercise per Session: 30 min  Stress: Not on file (02/13/2023)  Social Connections: Unknown (09/06/2022)   Received from [facility]   Social Network    Social Network: Not on file  Intimate Partner Violence: Unknown (09/06/2022)   Received from [facility]   HITS    Physically Hurt: Not on file    Insult or Talk Down To: Not on file    Threaten Physical Harm: Not on file    Scream or Curse: Not on file   Current Facility-Administered Medications on File Prior to Encounter  Medication Dose Route Frequency Provider Last Rate Last Admin   acetaminophen  (TYLENOL ) tablet 650 mg  650 mg Oral Once Jhonny Moss, MD       diphenhydrAMINE  (BENADRYL ) capsule 50 mg  50 mg Oral Once Jhonny Moss, MD       methylPREDNISolone  sodium succinate (SOLU-MEDROL ) 125 mg/2 mL injection 125 mg  125 mg Intravenous Once Jhonny Moss, MD       ocrelizumab  (OCREVUS ) 600 mg in sodium chloride  0.9 % 500 mL  600 mg Intravenous Once Jhonny Moss, MD       Current Outpatient Medications on File Prior to Encounter  Medication Sig Dispense Refill   aspirin  EC 81 MG tablet Take 1 tablet (81 mg total) by mouth daily. Take after 12 weeks for prevention of preeclampsia later in pregnancy 300 tablet 2   prenatal vitamin w/FE, FA (NATACHEW) 29-1 MG CHEW chewable tablet Chew 1 tablet by mouth daily at 12 noon. 30 tablet 12   loratadine  (CLARITIN ) 10 MG tablet Take 1 tablet (10 mg total) by mouth daily. (Patient not taking: Reported on 09/16/2023) 30 tablet 11   promethazine  (PHENERGAN ) 25 MG tablet Take 1 tablet  (25 mg total) by mouth every 6 (six) hours as needed for nausea or vomiting. (Patient not taking: Reported on 04/07/2023) 30 tablet 2   Vitamin D , Ergocalciferol , (DRISDOL ) 1.25 MG (50000 UNIT) CAPS capsule Take 1 capsule once a week for 8 weeks (Patient not taking: Reported on 03/13/2023) 8 capsule 0   No Known Allergies  ROS:  Pertinent positives/negatives listed above.  I have reviewed patient's Past Medical Hx, Surgical Hx, Family Hx, Social Hx, medications and allergies.   Physical Exam  Patient Vitals for the past 24 hrs:  BP Temp Temp src Pulse Resp SpO2 Height Weight  09/25/23 1600 (!) 159/94 -- -- 74 -- -- -- --  09/25/23 1545 (!) 144/86 -- -- 71 -- -- -- --  09/25/23 1530 133/80 -- -- 76 -- -- -- --  09/25/23 1515 (!) 133/91 -- -- 86 -- -- -- --  09/25/23 1500 135/86 -- -- 84 -- -- -- --  09/25/23 1445 138/76 -- -- 80 -- -- -- --  09/25/23 1435 135/89 -- -- 89 -- -- -- --  09/25/23 1401 (!) 141/90 -- -- 91 -- -- -- --  09/25/23 1346 136/86 -- -- 73 -- -- -- --  09/25/23 1335 -- -- -- -- -- 99 % -- --  09/25/23 1327 (!) 147/86 -- -- 74 -- 99 % -- --  09/25/23 1312 (!) 140/82 98.5 F (36.9 C) Oral 76 18 99 % -- --  09/25/23 1255 -- -- -- -- -- -- 5' 7 (1.702 m) 120.6 kg   Constitutional: Well-developed, well-nourished female in no acute distress  Cardiovascular: normal rate Respiratory: normal effort GI: Abd soft, non-tender MS: Extremities nontender, no edema, normal ROM Neurologic: Alert and oriented x 4  GU: Neg CVAT.  FHT:  Baseline 135, moderate variability, accelerations present, no decelerations Contractions: q 3-4 mins  LAB RESULTS Results for orders placed or performed during the hospital encounter of 09/25/23 (from the past 24 hours)  Protein / creatinine ratio, urine     Status: None   Collection Time: 09/25/23  1:33 PM  Result Value Ref Range   Creatinine, Urine 225 mg/dL   Total Protein, Urine 21 mg/dL   Protein Creatinine Ratio 0.09 0.00 - 0.15  mg/mg[Cre]  CBC     Status: None   Collection Time: 09/25/23  2:11 PM  Result Value Ref Range   WBC 10.0 4.0 - 10.5 K/uL   RBC 4.52 3.87 - 5.11 MIL/uL   Hemoglobin 12.0 12.0 - 15.0 g/dL   HCT 09.8 11.9 - 14.7 %   MCV 81.4 80.0 - 100.0 fL   MCH 26.5 26.0 - 34.0 pg  MCHC 32.6 30.0 - 36.0 g/dL   RDW 54.0 98.1 - 19.1 %   Platelets 276 150 - 400 K/uL   nRBC 0.0 0.0 - 0.2 %  Comprehensive metabolic panel     Status: Abnormal   Collection Time: 09/25/23  2:11 PM  Result Value Ref Range   Sodium 135 135 - 145 mmol/L   Potassium 3.9 3.5 - 5.1 mmol/L   Chloride 107 98 - 111 mmol/L   CO2 20 (L) 22 - 32 mmol/L   Glucose, Bld 86 70 - 99 mg/dL   BUN <5 (L) 6 - 20 mg/dL   Creatinine, Ser 4.78 0.44 - 1.00 mg/dL   Calcium 8.8 (L) 8.9 - 10.3 mg/dL   Total Protein 6.4 (L) 6.5 - 8.1 g/dL   Albumin 2.5 (L) 3.5 - 5.0 g/dL   AST 17 15 - 41 U/L   ALT 19 0 - 44 U/L   Alkaline Phosphatase 105 38 - 126 U/L   Total Bilirubin 0.5 0.0 - 1.2 mg/dL   GFR, Estimated >29 >56 mL/min   Anion gap 8 5 - 15  Type and screen [facility]     Status: None   Collection Time: 09/25/23  2:11 PM  Result Value Ref Range   ABO/RH(D) B POS    Antibody Screen NEG    Sample Expiration      09/28/2023,2359 Performed at [facility], [address] [place], [place] [address]     --/--/B POS (06/19 1411)  IMAGING US  MFM FETAL BPP WO NON STRESS Result Date: 09/25/2023 ----------------------------------------------------------------------  OBSTETRICS REPORT                       (Signed Final 09/25/2023 03:29 pm) ---------------------------------------------------------------------- Patient Info  ID #:       [id number]                          D.O.B.:  03-23-2001 (22 yrs)(F)  Name:       Arlean Bellow                 Visit Date: 09/25/2023 01:08 pm              [place] ---------------------------------------------------------------------- Performed By  Attending:        Penney Bowling DO       Ref.  Address:     [address]                                                             [place], [place]  [address]  Performed By:     Levonne Rear        [facility]  Referred By:      [facility] ---------------------------------------------------------------------- Orders  #  Description                           Code        Ordered By  1  US  MFM FETAL BPP WO NON               76819.01    Jordy Verba     STRESS ----------------------------------------------------------------------  #  Order #                     Accession #                Episode #  1  [id number]                   [id number]                 [id number] ---------------------------------------------------------------------- Indications  Non-reactive NST                               O28.9  Uterine size-date discrepancy, third trimester O26.843  Multiple sclerosis                             O99.350 G35  Obesity complicating pregnancy, third          O99.213  trimester (BMI 39)  Low Risk NIPS,Negative Horizon, Negative  AFP  Encounter for other antenatal screening        Z36.2  follow-up  [redacted] weeks gestation of pregnancy                Z3A.39 ---------------------------------------------------------------------- Fetal Evaluation  Num Of Fetuses:         1  Fetal Heart Rate(bpm):  130  Cardiac Activity:       Observed  Presentation:           Cephalic  Placenta:               Posterior Fundal  P. Cord Insertion:      Previously seen  Amniotic Fluid  AFI FV:      Within normal limits  AFI Sum(cm)     %Tile       Largest Pocket(cm)  20              85          6.9  RUQ(cm)       RLQ(cm)       LUQ(cm)        LLQ(cm)  6.9           3.7           5.4            4 ----------------------------------------------------------------------  Biophysical Evaluation  Amniotic F.V:   Pocket => 2 cm  F. Tone:        Observed  F. Movement:    Observed                   Score:          8/8  F. Breathing:   Observed ---------------------------------------------------------------------- OB History  Gravidity:    3         Term:   1        Prem:   0        SAB:   1  TOP:          0       Ectopic:  0        Living: 1 ---------------------------------------------------------------------- Gestational Age  LMP:           39w 3d        Date:  12/23/22                  EDD:   09/29/23  Best:          39w 3d     Det. By:  U/S C R L  (03/13/23)    EDD:   09/29/23 ---------------------------------------------------------------------- Anatomy  Cranium:               Appears normal         Kidneys:                Appear normal  Diaphragm:             Appears normal         Bladder:                Appears normal  Stomach:               Appears normal, left                         sided ---------------------------------------------------------------------- Comments  Hospital Ultrasound  The patient presented to the MAU for nonreactive NST.  Sonographic findings  Single intrauterine pregnancy at 39w 3d  Fetal cardiac activity: Observed and appears normal.  Presentation: Cephalic.  Limited fetal anatomy appears normal.  Amniotic fluid volume: Within normal limits. MVP: 6.9 cm.  Placenta: Posterior Fundal.  BPP 8/8.  Recommendations  - Due to elevated BP at term delivery should be  recommended if she rules in fora  hypertensive disorder of  pregnancy  This was a limited ultrasound with a remote read. If an official  MFM consult is requested for any reason please call/place an  order in Epic. ----------------------------------------------------------------------                  Penney Bowling, DO Electronically Signed Final Report   09/25/2023 03:29 pm ----------------------------------------------------------------------      MAU Management/MDM: Orders Placed This  Encounter  Procedures   US  MFM FETAL BPP WO NON STRESS   Protein / creatinine ratio, urine   CBC   Comprehensive metabolic panel   RPR   Notify physician (specify) Confirmatory reading of BP> 160/110 15 minutes later   Apply Hypertensive Disorders of Pregnancy Care Plan   Apply Hypertensive Disorders of Pregnancy Care Plan   Measure blood pressure   Type and screen [facility]    Meds ordered this encounter  Medications   DISCONTD: labetalol (NORMODYNE) injection 20 mg   DISCONTD: labetalol (NORMODYNE) injection 40 mg   DISCONTD: labetalol (NORMODYNE) injection 80  mg   DISCONTD: hydrALAZINE (APRESOLINE) injection 10 mg   AND Linked Order Group    labetalol (NORMODYNE) injection 20 mg    labetalol (NORMODYNE) injection 40 mg    labetalol (NORMODYNE) injection 80 mg    hydrALAZINE (APRESOLINE) injection 10 mg    Available prenatal records reviewed.  ASSESSMENT 1. Supervision of high risk pregnancy, antepartum   2. [redacted] weeks gestation of pregnancy   3. NST (non-stress test) nonreactive   4. Elevated blood pressure reading in office without diagnosis of hypertension   Reactive NST. 8/8 BPP.   New gestational hypertension - Asymptomatic, normal labs  PLAN Admit to L&D for IOL for newly diagnosed GHTN after 39 weeks  [place], MD FMOB Fellow, Faculty practice [facility], Center for [facility]  09/25/2023  4:10 PM

## 2023-09-25 NOTE — MAU Note (Signed)
 Stephanie Cabrera is a 23 y.o. at [redacted]w[redacted]d here in MAU reporting: sent from [facility] office for monitoring.  States when being monitored at office today the fetal heart rate dropped with every ctx.  Denies VB or LOF.  Endorses +FM.  LMP: 12/23/2022 Onset of complaint: today Pain score: 0 Vitals:   09/25/23 1312  BP: (!) 140/82  Pulse: 76  Resp: 18  Temp: 98.5 F (36.9 C)  SpO2: 99%     FHT: 152 bpm  Lab orders placed from triage: NA

## 2023-09-25 NOTE — H&P (Signed)
 OBSTETRIC ADMISSION HISTORY AND PHYSICAL  Stephanie Cabrera is a 23 y.o. female G3P1011 with IUP at [redacted]w[redacted]d by 11 week US  presenting for nonreactive NST in the office today but found to meet criteria for gestational hypertension. She reports +FMs, No LOF, no VB, no blurry vision, headaches or peripheral edema, and RUQ pain.  She plans on breast feeding. She request condoms or POPs for birth control. 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 - multiple sclerosis - obesity - GBS unknown - positive in 2020 pregnancy  Past Medical History: Past Medical History:  Diagnosis Date   Allergy    Multiple sclerosis (HCC) 2021   Seizures (HCC)     Past Surgical History: Past Surgical History:  Procedure Laterality Date   NO PAST SURGERIES      Obstetrical History: OB History     Gravida  3   Para  1   Term  1   Preterm      AB  1   Living  1      SAB  1   IAB      Ectopic      Multiple  0   Live Births  1           Social History Social History   Socioeconomic History   Marital status: Single    Spouse name: Not on file   Number of children: 1   Years of education: Not on file   Highest education level: GED or equivalent  Occupational History   Not on file  Tobacco Use   Smoking status: Never   Smokeless tobacco: Never  Vaping Use   Vaping status: Never Used  Substance and Sexual Activity   Alcohol use: Not Currently   Drug use: Never   Sexual activity: Yes    Partners: Male    Birth control/protection: None  Other Topics Concern   Not on file  Social History Narrative   Right Handed   One Story    Lives with  mom and daughter   No Caffeine   Social Drivers of Corporate investment banker Strain: Low Risk  (11/09/2021)   Overall Financial Resource Strain (CARDIA)    Difficulty of Paying Living Expenses: Not hard at all  Food Insecurity: No Food Insecurity (11/09/2021)   Hunger Vital Sign    Worried About Running Out of Food in the Last Year: Never true    Ran Out of Food in the Last Year: Never true  Transportation Needs: No Transportation Needs (11/09/2021)   PRAPARE - Administrator, Civil Service (Medical): No    Lack of Transportation (Non-Medical): No  Physical Activity: Sufficiently Active (11/09/2021)   Exercise Vital Sign    Days of Exercise per Week: 5 days    Minutes of Exercise per Session: 30 min  Stress: Not on file (02/13/2023)  Social Connections: Unknown (09/06/2022)   Received from [facility]   Social Network    Social Network: Not on file    Family History: Family History  Problem Relation Age of Onset   Healthy Mother    Healthy Father    Diabetes  Paternal Grandmother    Diabetes Paternal Grandfather    Hypertension Paternal Grandfather     Allergies: No Known Allergies  Medications Prior to Admission  Medication Sig Dispense Refill Last Dose/Taking   aspirin  EC 81 MG tablet Take 1 tablet (81 mg total) by mouth daily. Take after 12 weeks for prevention of preeclampsia later in pregnancy 300 tablet 2 09/24/2023   prenatal vitamin w/FE, FA (NATACHEW) 29-1 MG CHEW chewable tablet Chew 1 tablet by mouth daily at 12 noon. 30 tablet 12 09/24/2023   loratadine  (CLARITIN ) 10 MG tablet Take 1 tablet (10 mg total) by mouth daily. (Patient not taking: Reported on 09/16/2023) 30 tablet 11    promethazine  (PHENERGAN ) 25 MG tablet Take 1 tablet (25 mg total) by mouth every 6 (six) hours as needed for nausea or vomiting. (Patient not taking: Reported on 04/07/2023) 30 tablet 2    Vitamin D , Ergocalciferol , (DRISDOL ) 1.25 MG (50000 UNIT) CAPS capsule Take 1 capsule once a  week for 8 weeks (Patient not taking: Reported on 03/13/2023) 8 capsule 0      Review of Systems   All systems reviewed and negative except as stated in HPI  Blood pressure (!) 159/94, pulse 74, temperature 98.5 F (36.9 C), temperature source Oral, resp. rate 18, height 5' 7 (1.702 m), weight 120.6 kg, last menstrual period 12/23/2022, SpO2 99%. General appearance: alert, cooperative, appears stated age, and no distress Lungs: clear to auscultation bilaterally Heart: regular rate and rhythm Abdomen: soft, non-tender; bowel sounds normal Pelvic: adequate, proven to 3500g Extremities: Homans sign is negative, no sign of DVT DTR's 2+ Presentation: cephalic Fetal monitoringBaseline: 135 bpm, Variability: Good {> 6 bpm), Accelerations: Reactive, and Decelerations: Absent Uterine activityFrequency: Every 3-5 minutes Dilation: 4 Effacement (%): 50 Station: -2 Exam by:: Dr. Cooper Denver   Prenatal labs: ABO, Rh: --/--/B POS (06/19 1411) Antibody: NEG (06/19 1411) Rubella: 8.68 (12/05 0934) RPR: Non Reactive (03/28 0819)  HBsAg: Negative (12/05 0934)  HIV: Non Reactive (03/28 0819)  GBS:      Lab Results  Component Value Date   GBS Positive (A) 08/11/2018   GTT normal Genetic screening  negative, low risk female Anatomy US  normal female  Immunization History  Administered Date(s) Administered   DTaP 07/06/1999, 10/05/2000, 06/16/2003, 12/19/2003   DTaP / HiB 07/06/1999, 09/08/2000   Hepatitis B, PED/ADOLESCENT 1999[birth year]01[birth year]28, 01/22/1999, 07/06/1999   IPV 07/06/1999, 09/08/2000, 06/16/2003   MMR 09/08/2000, 06/16/2003   Tdap 07/07/2018   Varicella 06/16/2003    Prenatal Transfer Tool  Maternal Diabetes: No Genetic Screening: Normal Maternal Ultrasounds/Referrals: Normal Fetal Ultrasounds or other Referrals:  None Maternal Substance Abuse:  No Significant Maternal Medications:  None Significant Maternal Lab Results: Other:  GBS unknown; positive in 2020 pregnancy Number of  Prenatal Visits:greater than 3 verified prenatal visits Maternal Vaccinations:Covid Other Comments:  None   Results for orders placed or performed during the hospital encounter of 09/25/23 (from the past 24 hours)  Protein / creatinine ratio, urine   Collection Time: 09/25/23  1:33 PM  Result Value Ref Range   Creatinine, Urine 225 mg/dL   Total Protein, Urine 21 mg/dL   Protein Creatinine Ratio 0.09 0.00 - 0.15 mg/mg[Cre]  CBC   Collection Time: 09/25/23  2:11 PM  Result Value Ref Range   WBC 10.0 4.0 - 10.5 K/uL   RBC 4.52 3.87 - 5.11 MIL/uL   Hemoglobin 12.0 12.0 - 15.0 g/dL   HCT 78.2 95.6 - 21.3 %   MCV 81.4 80.0 - 100.0  fL   MCH 26.5 26.0 - 34.0 pg   MCHC 32.6 30.0 - 36.0 g/dL   RDW 16.1 09.6 - 04.5 %   Platelets 276 150 - 400 K/uL   nRBC 0.0 0.0 - 0.2 %  Comprehensive metabolic panel   Collection Time: 09/25/23  2:11 PM  Result Value Ref Range   Sodium 135 135 - 145 mmol/L   Potassium 3.9 3.5 - 5.1 mmol/L   Chloride 107 98 - 111 mmol/L   CO2 20 (L) 22 - 32 mmol/L   Glucose, Bld 86 70 - 99 mg/dL   BUN <5 (L) 6 - 20 mg/dL   Creatinine, Ser 4.09 0.44 - 1.00 mg/dL   Calcium 8.8 (L) 8.9 - 10.3 mg/dL   Total Protein 6.4 (L) 6.5 - 8.1 g/dL   Albumin 2.5 (L) 3.5 - 5.0 g/dL   AST 17 15 - 41 U/L   ALT 19 0 - 44 U/L   Alkaline Phosphatase 105 38 - 126 U/L   Total Bilirubin 0.5 0.0 - 1.2 mg/dL   GFR, Estimated >81 >19 mL/min   Anion gap 8 5 - 15  Type and screen [facility]   Collection Time: 09/25/23  2:11 PM  Result Value Ref Range   ABO/RH(D) B POS    Antibody Screen NEG    Sample Expiration      09/28/2023,2359 Performed at [facility], [address] [place], [place] [address]     Patient Active Problem List   Diagnosis Date Noted   Obesity affecting pregnancy, antepartum 05/05/2023   Supervision of high risk pregnancy, antepartum 03/13/2023   MS (multiple sclerosis) (HCC) 07/03/2020   Psychotic disorder due to medical condition  with hallucinations 09/24/2019    Assessment/Plan:  Antanisha Mohs is a 23 y.o. G3P1011 at [redacted]w[redacted]d here for IOL for newly diagnosed GHTN  #Labor:AROM +/- Pitocin  augmentation #Pain: Per pt request #FWB: Cat I #GBS status:  Unknown; positive with 2020 pregnancy #Feeding: Breastmilk  #Reproductive Life planning: Progesterone only pills and Condoms #Circ:  yes  #GHTN - Preeclampsia wnl. Asymptomatic. Mild range BP  Darrow End, MD  09/25/2023, 4:12 PM

## 2023-09-25 NOTE — Anesthesia Preprocedure Evaluation (Addendum)
 Anesthesia Evaluation  Patient identified by MRN, date of birth, ID band Patient awake    Reviewed: Allergy & Precautions, NPO status , Patient's Chart, lab work & pertinent test results  Airway Mallampati: II  TM Distance: >3 FB Neck ROM: Full    Dental no notable dental hx.    Pulmonary neg pulmonary ROS   Pulmonary exam normal breath sounds clear to auscultation       Cardiovascular hypertension, Normal cardiovascular exam Rhythm:Regular Rate:Normal  Gestational HTN on no Rx   Neuro/Psych Seizures -, Well Controlled,  PSYCHIATRIC DISORDERS      MS    GI/Hepatic Neg liver ROS,GERD  ,,  Endo/Other    Class 3 obesity  Renal/GU negative Renal ROS  negative genitourinary   Musculoskeletal negative musculoskeletal ROS (+)    Abdominal   Peds negative pediatric ROS (+)  Hematology  (+) Blood dyscrasia, anemia   Anesthesia Other Findings   Reproductive/Obstetrics (+) Pregnancy                              Anesthesia Physical Anesthesia Plan  ASA: II  Anesthesia Plan: Epidural   Post-op Pain Management:    Induction:   PONV Risk Score and Plan: Treatment may vary due to age or medical condition  Airway Management Planned: Natural Airway  Additional Equipment:   Intra-op Plan:   Post-operative Plan:   Informed Consent: I have reviewed the patients History and Physical, chart, labs and discussed the procedure including the risks, benefits and alternatives for the proposed anesthesia with the patient or authorized representative who has indicated his/her understanding and acceptance.       Plan Discussed with: Anesthesiologist  Anesthesia Plan Comments: (Patient identified. Risks, benefits, options discussed with patient including but not limited to bleeding, infection, nerve damage, paralysis, failed block, incomplete pain control, headache, blood pressure changes, nausea,  vomiting, reactions to medication, itching, and post partum back pain. Confirmed with bedside nurse the patient's most recent platelet count. Confirmed with the patient that they are not taking any anticoagulation, have any bleeding history or any family history of bleeding disorders. Patient expressed understanding and wishes to proceed. All questions were answered. )         Anesthesia Quick Evaluation

## 2023-09-26 ENCOUNTER — Encounter (HOSPITAL_COMMUNITY): Payer: Self-pay | Admitting: Family Medicine

## 2023-09-26 DIAGNOSIS — O99820 Streptococcus B carrier state complicating pregnancy: Secondary | ICD-10-CM

## 2023-09-26 DIAGNOSIS — O134 Gestational [pregnancy-induced] hypertension without significant proteinuria, complicating childbirth: Secondary | ICD-10-CM

## 2023-09-26 DIAGNOSIS — O368330 Maternal care for abnormalities of the fetal heart rate or rhythm, third trimester, not applicable or unspecified: Secondary | ICD-10-CM

## 2023-09-26 DIAGNOSIS — O99344 Other mental disorders complicating childbirth: Secondary | ICD-10-CM

## 2023-09-26 DIAGNOSIS — Z3A39 39 weeks gestation of pregnancy: Secondary | ICD-10-CM

## 2023-09-26 DIAGNOSIS — O99214 Obesity complicating childbirth: Secondary | ICD-10-CM

## 2023-09-26 LAB — RPR: RPR Ser Ql: NONREACTIVE

## 2023-09-26 MED ORDER — PRENATAL MULTIVITAMIN CH
1.0000 | ORAL_TABLET | Freq: Every day | ORAL | Status: DC
Start: 2023-09-26 — End: 2023-09-27
  Administered 2023-09-26: 1 via ORAL
  Filled 2023-09-26: qty 1

## 2023-09-26 MED ORDER — WITCH HAZEL-GLYCERIN EX PADS: 1.0000 | MEDICATED_PAD | CUTANEOUS | Status: DC | PRN

## 2023-09-26 MED ORDER — FUROSEMIDE 20 MG PO TABS
40.0000 mg | ORAL_TABLET | Freq: Every day | ORAL | Status: DC
  Administered 2023-09-26 – 2023-09-27 (×2): 40 mg via ORAL
  Filled 2023-09-26 (×2): qty 2

## 2023-09-26 MED ORDER — ONDANSETRON HCL 4 MG/2ML IJ SOLN: 4.0000 mg | INTRAMUSCULAR | Status: DC | PRN

## 2023-09-26 MED ORDER — ONDANSETRON HCL 4 MG PO TABS: 4.0000 mg | ORAL_TABLET | ORAL | Status: DC | PRN

## 2023-09-26 MED ORDER — DIPHENHYDRAMINE HCL 25 MG PO CAPS: 25.0000 mg | ORAL_CAPSULE | Freq: Four times a day (QID) | ORAL | Status: DC | PRN

## 2023-09-26 MED ORDER — MEDROXYPROGESTERONE ACETATE 150 MG/ML IM SUSP: 150.0000 mg | INTRAMUSCULAR | Status: DC | PRN

## 2023-09-26 MED ORDER — TRANEXAMIC ACID-NACL 1000-0.7 MG/100ML-% IV SOLN: 1000.0000 mg | Freq: Once | INTRAVENOUS | Status: AC

## 2023-09-26 MED ORDER — OXYCODONE HCL 5 MG PO TABS: 5.0000 mg | ORAL_TABLET | Freq: Four times a day (QID) | ORAL | Status: DC | PRN

## 2023-09-26 MED ORDER — ZOLPIDEM TARTRATE 5 MG PO TABS
5.0000 mg | ORAL_TABLET | Freq: Every evening | ORAL | Status: DC | PRN
Start: 2023-09-26 — End: 2023-09-27

## 2023-09-26 MED ORDER — COCONUT OIL OIL: 1.0000 | TOPICAL_OIL | Status: DC | PRN

## 2023-09-26 MED ORDER — TRANEXAMIC ACID-NACL 1000-0.7 MG/100ML-% IV SOLN
INTRAVENOUS | Status: AC
  Administered 2023-09-26: 1000 mg via INTRAVENOUS
  Filled 2023-09-26: qty 100

## 2023-09-26 MED ORDER — DIBUCAINE (PERIANAL) 1 % EX OINT: 1.0000 | TOPICAL_OINTMENT | CUTANEOUS | Status: DC | PRN

## 2023-09-26 MED ORDER — POTASSIUM CHLORIDE CRYS ER 20 MEQ PO TBCR
20.0000 meq | EXTENDED_RELEASE_TABLET | Freq: Every day | ORAL | Status: DC
  Administered 2023-09-26 – 2023-09-27 (×2): 20 meq via ORAL
  Filled 2023-09-26 (×2): qty 1

## 2023-09-26 MED ORDER — SENNOSIDES-DOCUSATE SODIUM 8.6-50 MG PO TABS: 2.0000 | ORAL_TABLET | Freq: Every day | ORAL | Status: DC

## 2023-09-26 MED ORDER — IBUPROFEN 800 MG PO TABS
800.0000 mg | ORAL_TABLET | Freq: Three times a day (TID) | ORAL | Status: DC
  Administered 2023-09-26 – 2023-09-27 (×4): 800 mg via ORAL
  Filled 2023-09-26 (×4): qty 1

## 2023-09-26 MED ORDER — OXYCODONE HCL 5 MG PO TABS: 10.0000 mg | ORAL_TABLET | Freq: Four times a day (QID) | ORAL | Status: DC | PRN

## 2023-09-26 MED ORDER — SIMETHICONE 80 MG PO CHEW: 80.0000 mg | CHEWABLE_TABLET | ORAL | Status: DC | PRN

## 2023-09-26 MED ORDER — ACETAMINOPHEN 500 MG PO TABS
1000.0000 mg | ORAL_TABLET | Freq: Three times a day (TID) | ORAL | Status: DC
Start: 2023-09-26 — End: 2023-09-27
  Administered 2023-09-26 – 2023-09-27 (×4): 1000 mg via ORAL
  Filled 2023-09-26 (×4): qty 2

## 2023-09-26 MED ORDER — MISOPROSTOL 200 MCG PO TABS
800.0000 ug | ORAL_TABLET | Freq: Once | ORAL | Status: AC
  Administered 2023-09-26: 800 ug via BUCCAL
  Filled 2023-09-26: qty 4

## 2023-09-26 MED ORDER — BENZOCAINE-MENTHOL 20-0.5 % EX AERO
1.0000 | INHALATION_SPRAY | CUTANEOUS | Status: DC | PRN
  Administered 2023-09-26: 1 via TOPICAL
  Filled 2023-09-26: qty 56

## 2023-09-26 NOTE — Progress Notes (Signed)
 Maternal Pt with history of seizures. Pt did not take medication during this pregnancy. This RN spoke with Pt about continuing her seizure medicine post partum and Pt told this RN that she didn't want to take seizure medicine as she hasn't had a seizure in years.

## 2023-09-26 NOTE — Progress Notes (Signed)
 LABOR PROGRESS NOTE  Patient Name: Stephanie Cabrera, female   DOB: [birth year], 23 y.o.  MRN: [id number]  Cervix now 9.5/100/0. Intermittent Cat II strip. Overall continues to be reassuring with moderate variability and accels. Anticipate SVD soon.  Maud Sorenson, MD

## 2023-09-26 NOTE — Anesthesia Postprocedure Evaluation (Signed)
 Anesthesia Post Note  Patient: Stephanie Cabrera  Procedure(s) Performed: AN AD HOC LABOR EPIDURAL     Patient location during evaluation: Mother Baby Anesthesia Type: Epidural Level of consciousness: awake and alert and oriented Pain management: satisfactory to patient Vital Signs Assessment: post-procedure vital signs reviewed and stable Respiratory status: respiratory function stable Cardiovascular status: stable Postop Assessment: no headache, no backache, epidural receding, patient able to bend at knees, no signs of nausea or vomiting, adequate PO intake and able to ambulate Anesthetic complications: no   No notable events documented.  Last Vitals:  Vitals:   09/26/23 1058 09/26/23 1416  BP: (!) 147/71 126/67  Pulse: 84 71  Resp:  18  Temp:  36.5 C  SpO2:  96%    Last Pain:  Vitals:   09/26/23 1416  TempSrc: Oral  PainSc: 2    Pain Goal:                   Stephanie Cabrera

## 2023-09-26 NOTE — Discharge Summary (Addendum)
 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 yo [id number] at [redacted]w[redacted]d was admitted to the hospital 09/25/2023 for induction of labor.  Indication for induction: Gestational hypertension.  Patient had an labor course complicated by none. Delivery complicated by terminal bradycardia requiring vacuum assistance for delivery. Membrane Rupture Time/Date: 10:52 PM,09/25/2023  Delivery Method:Vaginal, Vacuum (Extractor) Operative Delivery:Device used:Mityvac Indication: Fetal indications Episiotomy: None Lacerations:  2nd degree Details of delivery can be found in separate delivery note.  Patient had a postpartum course complicated by receiving Lasix  & K+ for BP ppx; morning of PPD#1 she had a BP value of 129/95- reports BP taken with cramping. Will recheck prior to d/c. Patient is discharged home 09/27/23 per her request for early d/c as long as the baby can go.  Newborn Data: Birth date:09/26/2023 Birth time:6:00 AM Gender:Female Living status:Living Apgars:5 ,8  Weight:3880 g (8lb 8.9oz)  Magnesium  Sulfate  received: No BMZ received: No Rhophylac:N/A MMR:N/A T-DaP:Given prenatally Flu: No RSV Vaccine received: No Transfusion:No  Immunizations received: Immunization History  Administered Date(s) Administered   DTaP 07/06/1999, 10/05/2000, 06/16/2003, 12/19/2003   DTaP / HiB 07/06/1999, 09/08/2000   Hepatitis B, PED/ADOLESCENT 10-04-98, 01/22/1999, 07/06/1999   IPV 07/06/1999, 09/08/2000, 06/16/2003   MMR 09/08/2000, 06/16/2003   Tdap 07/07/2018   Varicella 06/16/2003    Physical exam  Vitals:   09/26/23 1416 09/26/23 1757 09/26/23 2131 09/27/23 0517  BP: 126/67 115/86 121/69 (!) 129/95  Pulse: 71 70 76 74  Resp: 18 17 18 18   Temp: 97.7 F (36.5 C) 98.2 F (36.8 C) (!) 97.4 F (36.3 C) 97.8 F (36.6 C)  TempSrc: Oral Oral Oral Oral  SpO2: 96% 97% 99%   Weight:      Height:       General: alert, cooperative, and no distress Lochia: appropriate Uterine Fundus: firm Incision: N/A DVT Evaluation: No evidence of DVT seen on physical exam. Labs: Lab Results  Component Value Date   WBC 9.6 09/25/2023   HGB 11.7 (L) 09/25/2023   HCT 35.5 (L) 09/25/2023   MCV 82.0 09/25/2023   PLT 274 09/25/2023      Latest Ref Rng & Units 09/25/2023    2:11 PM  CMP  Glucose 70 - 99 mg/dL 86   BUN 6 - 20 mg/dL <5   Creatinine 9.55 - 1.00 mg/dL 9.33   Sodium 864 - 854 mmol/L 135   Potassium 3.5 - 5.1 mmol/L 3.9   Chloride 98 - 111 mmol/L 107   CO2 22 - 32 mmol/L 20  Calcium 8.9 - 10.3 mg/dL 8.8   Total Protein 6.5 - 8.1 g/dL 6.4   Total Bilirubin 0.0 - 1.2 mg/dL 0.5   Alkaline Phos 38 - 126 U/L 105   AST 15 - 41 U/L 17   ALT 0 - 44 U/L 19    Edinburgh Score:    09/26/2023    9:30 PM  Edinburgh Postnatal Depression Scale Screening Tool  I have been able to laugh and see the funny side of things. 0  I have looked forward with enjoyment to things. 0  I have blamed myself unnecessarily when things went wrong. 0  I have been anxious or worried for no good reason. 0  I have  felt scared or panicky for no good reason. 0  Things have been getting on top of me. 0  I have been so unhappy that I have had difficulty sleeping. 0  I have felt sad or miserable. 0  I have been so unhappy that I have been crying. 1  The thought of harming myself has occurred to me. 0  Edinburgh Postnatal Depression Scale Total 1   Edinburgh Postnatal Depression Scale Total: 1   After visit meds:  Allergies as of 09/27/2023   No Known Allergies      Medication List     STOP taking these medications    aspirin  EC 81 MG tablet   promethazine  25 MG tablet Commonly known as: PHENERGAN        TAKE these medications    furosemide  40 MG tablet Commonly known as: LASIX  Take 1 tablet (40 mg total) by mouth daily for 3 days. Start taking on: September 28, 2023   ibuprofen  800 MG tablet Commonly known as: ADVIL  Take 1 tablet (800 mg total) by mouth every 8 (eight) hours.   loratadine  10 MG tablet Commonly known as: Claritin  Take 1 tablet (10 mg total) by mouth daily.   potassium chloride  SA 20 MEQ tablet Commonly known as: KLOR-CON  M Take 1 tablet (20 mEq total) by mouth daily for 3 days. Start taking on: September 28, 2023   prenatal vitamin w/FE, FA 29-1 MG Chew chewable tablet Chew 1 tablet by mouth daily at 12 noon.   Vitamin D  (Ergocalciferol ) 1.25 MG (50000 UNIT) Caps capsule Commonly known as: DRISDOL  Take 1 capsule once a week for 8 weeks         Discharge home in stable condition Infant Feeding: Breast Infant Disposition:home with mother Discharge instruction: per After Visit Summary and Postpartum booklet. Activity: Advance as tolerated. Pelvic rest for 6 weeks.  Diet: routine diet Future Appointments: Future Appointments  Date Time Provider Department Center  09/30/2023 10:00 AM Stephanie Cabrera LABOR, MD [facility] None  10/21/2023 11:30 AM Stephanie Darice HERO, MD [facility] None   Follow up Visit:  Message sent to [facility] 6/20  Please schedule this patient for a In  person postpartum visit in 6 weeks with the following provider: Any provider. Additional Postpartum F/U: Postpartum Depression checkup and BP check 1 week  High risk pregnancy complicated by: MS, gHTN Delivery mode:  Vaginal, Vacuum (Extractor) Anticipated Birth Control:  POPs vs condoms   09/27/2023 Stephanie Cassis, MD  CNM attestation I have seen and examined this patient and agree with above documentation in the resident's note.   Stephanie Cabrera is a 63 y.o. [phone] s/p VAVD after IOL for gHTN.   Pain is well controlled.  Plan for birth control is POPs v condoms.  Method of Feeding: breast  PE:  BP (!) 129/95   Pulse 74   Temp 97.8 F (36.6 C) (Oral)   Resp 18   Ht 5' 7 (1.702 m)   Wt 119.7 kg   LMP 12/23/2022 Comment: ?12/23/22  SpO2 99%   Breastfeeding Unknown   BMI 41.35 kg/m  Fundus firm  Recent Labs    09/25/23 1411 09/25/23 1957  HGB 12.0 11.7*  HCT 36.8 35.5*     Plan: discharge today - postpartum care discussed, including Lasix  & K+ - f/u clinic in 1wk for BP check, then 4-6 weeks for postpartum visit   Stephanie Cabrera, CNM 12:23 PM 09/27/2023

## 2023-09-26 NOTE — Lactation Note (Signed)
 This note was copied from a baby's chart. Lactation Consultation Note  Patient Name: Stephanie Cabrera [id number] Date: 09/26/2023 Age:23 years Reason for consult: Initial assessment;Term  P2- MOB reports that infant has been latching well so far. MOB plans to exclusively breast feed and offer pumped milk as needed. MOB denies experiencing any pain or pinching when infant latches. MOB denies having any questions or concerns at this time. LC was unable to see a latch because infant had just fed. Infant previously had a latch score of 7. MOB has a hands free pump, but qualifies for a STORK pump. LC sent referral today. LC flanged sized her at an 18 mm flange and reviewed where to purchase inserts.  LC reviewed the first 24 hr birthday nap, day 2 cluster feeding, feeding infant on cue 8-12x in 24 hrs, not allowing infant to go over 3 hrs without a feeding, CDC milk storage guidelines, LC services handout and engorgement/breast care. LC encouraged MOB to call for further assistance as needed.  Maternal Data Has patient been taught Hand Expression?: Yes Does the patient have breastfeeding experience prior to this delivery?: Yes How long did the patient breastfeed?: 1 year  Feeding Mother's Current Feeding Choice: Breast Milk  Lactation Tools Discussed/Used Tools: Pump;Flanges Flange Size: 18 (sized her for home pump) Pump Education: Milk Storage  Interventions Interventions: Breast feeding basics reviewed;Education;LC Services brochure  Discharge Discharge Education: Engorgement and breast care;Warning signs for feeding baby Pump: Hands Free;Personal;Referral sent for [facility] Pump  Consult Status Consult Status: Complete Date: 09/26/23    Vernette Goo BS, IBCLC 09/26/2023, 4:39 PM

## 2023-09-26 NOTE — Progress Notes (Signed)
 LABOR PROGRESS NOTE  Patient Name: Stephanie Cabrera, female   DOB: 2000-04-18, 23 y.o.  MRN: [id number]  Cervix 4.5/60/-2 anterior per RN check. Tracing remains Cat I-Cat II with normal baseline with a mix of early, variable, and late decelerations intermittently. Responds well to position changes. Overall moderate variability and accels present throughout. Continue pitocin . Anticipate transition to active labor soon.  Maud Sorenson, MD

## 2023-09-27 ENCOUNTER — Encounter: Payer: Self-pay | Admitting: Neurology

## 2023-09-27 ENCOUNTER — Other Ambulatory Visit (HOSPITAL_COMMUNITY): Payer: Self-pay

## 2023-09-27 MED ORDER — LIDOCAINE 1% INJECTION FOR CIRCUMCISION: 0.8000 mL | INJECTION | Freq: Once | INTRAVENOUS | Status: DC

## 2023-09-27 MED ORDER — SUCROSE 24% NICU/PEDS ORAL SOLUTION: 0.5000 mL | OROMUCOSAL | Status: DC | PRN

## 2023-09-27 MED ORDER — WHITE PETROLATUM EX OINT: 1.0000 | TOPICAL_OINTMENT | CUTANEOUS | Status: DC | PRN

## 2023-09-27 MED ORDER — FUROSEMIDE 40 MG PO TABS
40.0000 mg | ORAL_TABLET | Freq: Every day | ORAL | 0 refills | Status: AC
  Filled 2023-09-27: qty 3, 3d supply, fill #0

## 2023-09-27 MED ORDER — GELATIN ABSORBABLE 12-7 MM EX MISC: 1.0000 | Freq: Once | CUTANEOUS | Status: DC | PRN

## 2023-09-27 MED ORDER — IBUPROFEN 800 MG PO TABS
800.0000 mg | ORAL_TABLET | Freq: Three times a day (TID) | ORAL | 0 refills | Status: AC
Start: 2023-09-27 — End: ?
  Filled 2023-09-27: qty 30, 10d supply, fill #0

## 2023-09-27 MED ORDER — EPINEPHRINE TOPICAL FOR CIRCUMCISION 0.1 MG/ML: 1.0000 [drp] | TOPICAL | Status: DC | PRN

## 2023-09-27 MED ORDER — POTASSIUM CHLORIDE CRYS ER 20 MEQ PO TBCR
20.0000 meq | EXTENDED_RELEASE_TABLET | Freq: Every day | ORAL | 0 refills | Status: AC
  Filled 2023-09-27: qty 3, 3d supply, fill #0

## 2023-09-27 MED ORDER — FERROUS SULFATE 325 (65 FE) MG PO TABS: 325.0000 mg | ORAL_TABLET | ORAL | Status: DC

## 2023-09-27 NOTE — Clinical Social Work Maternal (Addendum)
 CLINICAL SOCIAL WORK MATERNAL/CHILD NOTE  Patient Details  Name: Stephanie Cabrera MRN: [id number] Date of Birth: Aug 18, 2000  Date:  09/27/2023  Clinical Social Worker Initiating Note:  Sharyne Roulette, LCSWA Date/Time: Initiated:  09/27/23/1416     Child's Name:  Stephanie Cabrera   Biological Parents:  Mother, Father (FOB: Stephanie Cabrera, DOB: 09/05/1994)   Need for Interpreter:  None   Reason for Referral:  Behavioral Health Concerns   Address:  [address] [place] [address]    Phone number:  [phone] (home)     Additional phone number:   Household Members/Support Persons (HM/SP):   Household Member/Support Person 1, Household Member/Support Person 2   HM/SP Name Relationship DOB or Age  HM/SP -1 Stephanie Cabrera FOB 09/05/1994  HM/SP -2 Stephanie Cabrera Daughter 09/06/2018  HM/SP -3        HM/SP -4        HM/SP -5        HM/SP -6        HM/SP -7        HM/SP -8          Natural Supports (not living in the home):  Church, Immediate Family, Extended Family, Friends   Radiographer, therapeutic Supports: None   Employment: Environmental education officer   Type of Work: Inventory   Education:  [employer] school graduate   Homebound arranged:    Surveyor, quantity Resources:  [employer], Media planner    Other Resources:   (WIC referral placed during consult)   Cultural/Religious Considerations Which May Impact Care:  Per [employer], MOB identifies as Curator.  Strengths:  Ability to meet basic needs  , Home prepared for child  , Pediatrician chosen   Psychotropic Medications:         Pediatrician:    [place]  Pediatrician List:   [place]  [facility]  [facility]      Pediatrician Fax Number:    Risk Factors/Current Problems:  Mental Health Concerns     Cognitive State:  Linear Thinking  , Able to Concentrate  , Goal Oriented  , Alert     Mood/Affect:   Comfortable  , Calm  , Interested  , Euthymic     CSW Assessment: CSW was consulted due to psychotic disorder and hallucinations and MOB dropped baby at 7 hours of life. CSW met with MOB at bedside to complete assessment. When CSW entered room, MOB was observed laying in hospital bed with infant beside her. FOB was present sitting nearby. CSW introduced self and requested to speak with MOB alone. MOB provided verbal consent for CSW to complete consult with FOB present. CSW explained reason for consult. MOB presented as calm, was agreeable to consult and remained engaged throughout encounter. During consult MOB did not display any acute mental health signs/symptoms.   MOB confirmed demographic information listed in chart as correct. MOB reports she lives with FOB and her 43 year-old daughter.   CSW inquired about MOB's mental health history. 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 MOB denied a history of command hallucinations. MOB reports her symptoms of psychosis occurred between 2021-2022 and she has not endorsed symptoms of delusional thinking, auditory or visual hallucinations (AVH) since 2022. Per chart review, MOB had an inpatient psychiatric admission at [facility] 10/2019 due to abnormal behavior and auditory  hallucinations and also presented to [facility] 12/2019 due to endorsing AVH. MOB shared she feels her history of psychosis is unrelated to her medical diagnosis of Multiple Sclerosis. MOB reports she is followed by [facility] for management of MS and has an upcoming appointment scheduled next month (July, 2025).  CSW inqiured about MOB's mental health during pregnancy. MOB denied endorsing symptoms of depression, anxiety, or psychosis during pregnancy. MOB reports she is not currently prescribed psychotropic medication or in therapy. CSW inquired if MOB experienced symptoms of  postpartum depression/anxiety or postpartum psychosis after the birth of her daughter in 23. MOB recalled endorsing symptoms of postpartum depression, marked by not eating, isolating herself from others, and staying at home. MOB reports she did not receive mental health treatment until she began endorsing symptoms of psychosis, which occurred 1 year following the birth of her daughter. MOB reports she was connected with a psychiatrist and therapist in 2021 and was prescribed Risperdal . MOB was unable to recall the name or agency of her psychiatrist or therapist but states that she tapered off of Risperdal  under the guidance of her psychiatrist at the time due to not feeling that she needed to be on the medication anymore. MOB declined outpatient mental health resources at this time, stating she knows how to get in touch with her prior therapist if in need of additional support. CSW provided review of crisis resources. MOB reports she is aware of [facility]. CSW also reviewed integrated behavioral health support available through [facility]. MOB states her family was involved in her care when she began experiencing psychosis in 2021, they know signs to be aware of, and how to get her help if warranted. MOB identified FOB, FOB's dad, and her side of the family as supports. MOB denied current SI/HI and denied endorsing symptoms of AVH since infant's birth.  CSW provided education regarding the baby blues period vs. perinatal mood disorders, discussed treatment and gave resources for mental health follow up if concerns arise.  CSW recommends self-evaluation during the postpartum time period using the New Mom Checklist from Postpartum Progress and encouraged MOB to contact a medical professional if symptoms are noted at any time.    MOB reports she has all needed items for infant, including a car seat and bassinet. MOB has chosen [facility] in [place] for infant's follow up care.  Per chart review, MOB fell asleep while breastfeeding infant at 7 hours of life and infant fell out of bed. CSW provided review of Sudden Infant Death Syndrome (SIDS) precautions and safe sleep guidelines. MOB expressed understanding. CSW noted bed rail was raised during assessment.  CSW identifies no further need for intervention and no barriers to discharge at this time.  CSW Plan/Description:  No Further Intervention Required/No Barriers to Discharge, Sudden Infant Death Syndrome (SIDS) Education, Other Information/Referral to [facility], Perinatal Mood and Anxiety Disorder (PMADs) Education    Sharyne MARLA Roulette, LCSWA 09/27/2023, 2:22 PM

## 2023-09-27 NOTE — Progress Notes (Signed)
 The night shift RN called [place] Certified Nurse Midwife to discuss possible  meds for the patient. At this time, the provider had talked to the patient earlier and had verified that the patient had not had a seizure in years. The provider had verified with the patient that she did not wish to take seizure medications.   An injectable for the patient's MS will not be given during her stay.   Per the provider, the patient was being followed by a neurologist.  The Rn did informed the MD that there were seizure pads on the patient's bed. In addition,the Rn informed the provider that the patient had dropped the baby at 7 hours post birth. The FOB had left the room when the patient had fallen asleep and the baby fell to the floor.  No further orders were given.   An ultrasound of the baby's head was ordered by the peds provider.

## 2023-09-27 NOTE — Progress Notes (Shared)
 POSTPARTUM PROGRESS NOTE  Subjective: Stephanie Cabrera is a 23 y.o. H6E7987 s/p SVD at [redacted]w[redacted]d.  She reports she doing well. No acute events overnight. She denies any problems with ambulating, voiding or po intake. Denies nausea or vomiting. She has passed BM. Pain is well controlled.  Lochia is appropriate, about a period. No HA, VC, RUQ pain, extremity swelling.  Objective: Vitals:   09/26/23 2131 09/27/23 0517  BP: 121/69 (!) 129/95  Pulse: 76 74  Resp: 18 18  Temp: (!) 97.4 F (36.3 C) 97.8 F (36.6 C)  SpO2: 99%     Physical Exam:  General: alert, cooperative and no distress Chest: no respiratory distress Abdomen: soft Uterine Fundus: firm and at level of umbilicus Extremities: BLE without edema, erythema, tenderness. Normal DP pulses bilaterally.   Recent Labs    09/25/23 1411 09/25/23 1957  HGB 12.0 11.7*  HCT 36.8 35.5*    Assessment/Plan: Stephanie Cabrera is a 23 y.o. H6E7987 s/p SVD at [redacted]w[redacted]d.  Routine Postpartum Care: Doing well, pain well-controlled.  -- Continue routine care, lactation support  -- Contraception: POCP, condoms -- Feeding: breast  #gHTN -Elevated to 129/95 this morning, asymptomatic -Cont lasix , K   Dispo: Plan for discharge today if infant meeting goals.  Diona Perkins, MD 09/27/2023 7:36 AM

## 2023-09-27 NOTE — Plan of Care (Signed)
  Problem: Education: °Goal: Knowledge of disease or condition will improve °Outcome: Completed/Met °  °

## 2023-09-30 ENCOUNTER — Encounter: Admitting: Obstetrics

## 2023-10-02 ENCOUNTER — Ambulatory Visit

## 2023-10-06 ENCOUNTER — Ambulatory Visit

## 2023-10-06 VITALS — BP 126/82 | HR 86 | Wt 237.9 lb

## 2023-10-06 DIAGNOSIS — O135 Gestational [pregnancy-induced] hypertension without significant proteinuria, complicating the puerperium: Secondary | ICD-10-CM

## 2023-10-06 DIAGNOSIS — O139 Gestational [pregnancy-induced] hypertension without significant proteinuria, unspecified trimester: Secondary | ICD-10-CM

## 2023-10-06 NOTE — Progress Notes (Signed)
 Stephanie Cabrera  H6E7987 here for postpartum depression screen. Pt is currently 0 weeks postpartum. Pt reports doing well and happy.      Discussed with provider results of [place] 0.  Pt to follow up at scheduled [facility] .     Subjective:  Stephanie Cabrera is a H6E7987 here for BP check.  She is 1 week postpartum following a vacuum-assisted vaginal delivery.    Hypertension ROS: Patient denies headache, visual changes, backache, and dysuria   Objective:  LMP 12/23/2022 Comment: ?12/23/22  Appearance alert, well appearing, and in no distress.  Assessment:   Blood Pressure well controlled.   Plan:  Follow up at [facility] as scheduled .   Waddell Renie Baller, CMA

## 2023-10-21 ENCOUNTER — Telehealth (INDEPENDENT_AMBULATORY_CARE_PROVIDER_SITE_OTHER): Admitting: Neurology

## 2023-10-21 ENCOUNTER — Encounter: Payer: Self-pay | Admitting: Neurology

## 2023-10-21 ENCOUNTER — Other Ambulatory Visit: Payer: Self-pay | Admitting: *Deleted

## 2023-10-21 DIAGNOSIS — R7989 Other specified abnormal findings of blood chemistry: Secondary | ICD-10-CM

## 2023-10-21 DIAGNOSIS — Z87898 Personal history of other specified conditions: Secondary | ICD-10-CM | POA: Diagnosis not present

## 2023-10-21 DIAGNOSIS — G35 Multiple sclerosis: Secondary | ICD-10-CM | POA: Diagnosis not present

## 2023-10-21 NOTE — Progress Notes (Signed)
 Virtual Visit via Video Note The purpose of this virtual visit is to provide medical care while limiting exposure to the novel coronavirus.    Consent was obtained for video visit:  Yes.   Answered questions that patient had about telehealth interaction:  Yes.   I discussed the limitations, risks, security and privacy concerns of performing an evaluation and management service by telemedicine. I also discussed with the patient that there may be a patient responsible charge related to this service. The patient expressed understanding and agreed to proceed.  Pt location: Home Physician Location: office Name of referring provider:  [place], Stephanie Cabrera I connected with Stephanie Cabrera at patients initiation/request on 10/21/2023 at 11:30 AM EDT by video enabled telemedicine application and verified that I am speaking with the correct person using two identifiers. Pt MRN:  [id number] Pt DOB:  16-Sep-2000 Video Participants:  Stephanie Cabrera   History of Present Illness:  The patient had a virtual video visit on 10/21/2023. She was last seen in the neurology clinic 3 months ago for MS and seizures. Records were reviewed. She delivered a healthy baby boy, Stephanie Cabrera, last month. She is doing well. No symptoms concerning for MS flare. She has chronic intermittent right hand tingling that is stable. She denies any vision changes, new focal symptoms, no headaches, dizziness, no falls. She has not had any seizures since 2021, off oxcarbazepine  since 2022. She is getting better sleep now, last night she got 7 hours. She plans to breastfeed for a year.   Her last Ocrevus  infusion was in 08/2022. None since pregnancy. Her last brain MRI was in 04/2021 with no new lesions, many had regressed in conspicuity with resolution of enhancing lesions.  Vitamin D  level in 06/2022 17.09   History on Initial Assessment 11/02/2019: This is a pleasant 23 year old right-handed woman with a history of anxiety  presenting for evaluation of seizure-like activity that occurred on 09/02/2019. She was in her usual state of health until 09/02/19 when she recalls sitting on the commode then waking up in the ambulance. Her mother reports that she was tired and took 4 tablets of melatonin 3mg  which her mother thought was too much. She increased her water intake and was in the bathroom when she called her mother saying her right hand was going numb, then she started losing consciousness and fell off the toilet. Her mother laid her on the ground and saw her whole body stiffening and shaking for 3 minutes. She bit her tongue. When EMS arrived, she could say her name and follow instructions, no focal weakness. She left the ER AMA. She saw her PCP on 09/15/19 and reported increassed stress worse after her baby was born in 08/2018. A week later, she was brought to the ER on 6/17 for IVC because she started destroying her TV and lamp in her bedroom. Family walked into the room and she was naked on her dresser with items around her. Family calmed her down and she indicated no memory of the event. According to IVC paperwork, this has happened 2 times previously, she had told them she has been speaking to God. She had not been sleeping well. Head CT no acute changes. 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 Per notes, she had been having auditory hallucinations for several weeks to months, however her mother states she started hallucinating after the seizure in May. She went  back to [facility] on 7/8 due to recurrence of auditory hallucinations. Dose increased to 4mg  qhs yesterday. She denies any further hallucinations. She states her mood is good, she denies any depression or anxiety. Her mother reports that when the seizure occurred, her anxiety was bad. 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 She is now sleeping  better.   She has had constant numbness in her hands since she gave birth a year ago, R>L. She denies any headaches, dizziness, diplopia, neck/back pain, bowel/bladder dysfunction. She has occasional episodes of a metallic taste in her mouth for a few minutes that occur a couple of times a month. She has occasional jerks in her back. Her mother denies any staring/unresponsive episodes. She lives with her mother and 14 year old daughter Stephanie Cabrera. Her mother feels she is back to baseline and is mostly worried about her memory. Since the seizure, she cannot remember where she puts things. Her mother administers her medication. She has not been driving. She denies any alcohol or illicit drug use.   Epilepsy Risk Factors:  Her paternal grandfather had seizures. Otherwise she had a normal birth and early development.  There is no history of febrile convulsions, CNS infections such as meningitis/encephalitis, significant traumatic brain injury, neurosurgical procedures.  Update 02/04/20: She had another seizure that occurred in her sleep at 3am last 01/12/20. Her mother heard her yell out and found her having a GTC that lasted less than a minute. She was confused after, no focal weakness. She was started on oxcarbazepine  300mg  BID. She has since also finished a 3-day course of IV Solumedrol. She presents to discuss MRI cervical and thoracic spine with and without contrast done 02/02/20. The cervical cord appears mildly expanded with diffuse heterogeneous T2 signal from C1 through C5, no abnormal enhancement. There is minimal heterogeneity in the proximal thoracic cord with inferior extension to the T3-4 disc space level, no abnormal enhancement.   Routine and 24-hour EEG in 11/2019 which were abnormal due to focal slowing over the bilateral temporal regions, right greater than left. There was also note of frontal intermittent rhythmic delta activity (FIRDA). No epileptiform discharges seen, typical events not  captured.  MRI brain with and without contrast in 01/2020 showed numerous white matter lesions, many are periventricular, some are subcortical and deep white matter. There is a 10x58mm lesion in the left brachium pontis with additional smaller lesions in the brachium pontis bilaterally. There was some restricted diffusion in the left mid-frontal lobe, multiple enhancing lesions in the anterior frontal lobes bilaterally, right middle frontal lobe, right parietal periventricular white matter. Findings felt compatible with multiple sclerosis. MRI cervical and thoracic spine with and without contrast done 02/02/20. The cervical cord appears mildly expanded with diffuse heterogeneous T2 signal from C1 through C5, no abnormal enhancement. There is minimal heterogeneity in the proximal thoracic cord with inferior extension to the T3-4 disc space level, no abnormal enhancement.     Current Outpatient Medications on File Prior to Visit  Medication Sig Dispense Refill   prenatal vitamin w/FE, FA (NATACHEW) 29-1 MG CHEW chewable tablet Chew 1 tablet by mouth daily at 12 noon. 30 tablet 12   furosemide  (LASIX ) 40 MG tablet Take 1 tablet (40 mg total) by mouth daily for 3 days. 3 tablet 0   ibuprofen  (ADVIL ) 800 MG tablet Take 1 tablet (800 mg total) by mouth every 8 (eight) hours. 30 tablet 0  loratadine  (CLARITIN ) 10 MG tablet Take 1 tablet (10 mg total) by mouth daily. (Patient not taking: Reported on 09/16/2023) 30 tablet 11   potassium chloride  SA (KLOR-CON  M) 20 MEQ tablet Take 1 tablet (20 mEq total) by mouth daily for 3 days. 3 tablet 0   Vitamin D , Ergocalciferol , (DRISDOL ) 1.25 MG (50000 UNIT) CAPS capsule Take 1 capsule once a week for 8 weeks (Patient not taking: Reported on 03/13/2023) 8 capsule 0   Current Facility-Administered Medications on File Prior to Visit  Medication Dose Route Frequency Provider Last Rate Last Admin   acetaminophen  (TYLENOL ) tablet 650 mg  650 mg Oral Once Georjean Darice HERO,  Cabrera       diphenhydrAMINE  (BENADRYL ) capsule 50 mg  50 mg Oral Once Emmy Keng M, Cabrera       methylPREDNISolone  sodium succinate (SOLU-MEDROL ) 125 mg/2 mL injection 125 mg  125 mg Intravenous Once Georjean Darice HERO, Cabrera       ocrelizumab  (OCREVUS ) 600 mg in sodium chloride  0.9 % 500 mL  600 mg Intravenous Once Georjean Darice HERO, Cabrera         Observations/Objective:   GEN:  The patient appears stated age and is in NAD. Neurological examination: Patient is awake, alert. No aphasia or dysarthria. Intact fluency and comprehension.Cranial nerves: Extraocular movements intact. No facial asymmetry. Motor: moves all extremities symmetrically, at least anti-gravity x 4.    Assessment and Plan:   This is a pleasant 23 yo RH woman with a history of anxiety, new onset seizures in 2021, MRI brain at that time showed changes consistent with MS, confirmed by lumbar puncture. She was having auditory hallucinations, these have resolved. She self-discontinued oxcarbazepine  in 2022 with no seizure recurrence since 2021. No MS flares, Ocrevus  has been on hold due to pregnancy, she is now breastfeeding. She has not had any MS flares, we discussed doing interval brain MRI with and without contrast for follow-up. Vitamin D  has been historically low, recommend rechecking level. On last visit, she planned to ask OB to add to her routine bloodwork but I do not see results. She is aware of [place] driving laws to stop driving after a seizure until 6 months seizure-free. Follow-up in 6 months, call for any changes.    Follow Up Instructions:   -I discussed the assessment and treatment plan with the patient. The patient was provided an opportunity to ask questions and all were answered. The patient agreed with the plan and demonstrated an understanding of the instructions.   The patient was advised to call back or seek an in-person evaluation if the symptoms worsen or if the condition fails to improve as anticipated.    Darice HERO Georjean, Cabrera

## 2023-10-21 NOTE — Patient Instructions (Addendum)
 Congratulations! It was good to see you.  Have bloodwork done for vitamin D  level  2. Schedule MRI brain and cervical spine with and without contrast  3. We can continue to monitor symptoms, however if the MRI shows any changes, we will discuss other options aside from Ocrevus   4. Follow-up in 6 months, call for any changes

## 2023-10-31 ENCOUNTER — Ambulatory Visit: Admitting: Obstetrics

## 2023-10-31 ENCOUNTER — Encounter: Payer: Self-pay | Admitting: Obstetrics

## 2023-10-31 DIAGNOSIS — Z3009 Encounter for other general counseling and advice on contraception: Secondary | ICD-10-CM | POA: Diagnosis not present

## 2023-10-31 NOTE — Progress Notes (Signed)
 Pt presents for PP visit. Provided form for clearance to return to work on 11/03/23. No other questions or concerns.

## 2023-10-31 NOTE — Progress Notes (Signed)
 Post Partum Visit Note  Stephanie Cabrera is a 23 y.o. [phone] female who presents for a postpartum visit. She is 5 weeks postpartum following a vacuum-assisted vaginal delivery.  I have fully reviewed the prenatal and intrapartum course. The delivery was at 39 gestational weeks.  Anesthesia: epidural. Postpartum course has been good. Baby is doing well. Baby is feeding by both breast and bottle - Similac 360. Bleeding no bleeding. Bowel function is normal. Bladder function is normal. Patient is not sexually active. Contraception method is Undecided. Postpartum depression screening: negative.   The pregnancy intention screening data noted above was reviewed. Potential methods of contraception were discussed. The patient elected to proceed with No data recorded.   Edinburgh Postnatal Depression Scale - 10/31/23 0927       Edinburgh Postnatal Depression Scale:  In the Past 7 Days   I have been able to laugh and see the funny side of things. 0    I have looked forward with enjoyment to things. 0    I have blamed myself unnecessarily when things went wrong. 0    I have been anxious or worried for no good reason. 0    I have felt scared or panicky for no good reason. 0    Things have been getting on top of me. 0    I have been so unhappy that I have had difficulty sleeping. 0    I have felt sad or miserable. 0    I have been so unhappy that I have been crying. 0    The thought of harming myself has occurred to me. 0    Edinburgh Postnatal Depression Scale Total 0          Health Maintenance Due  Topic Date Due   HPV VACCINES (1 - 3-dose series) Never done   COVID-19 Vaccine (1 - 2024-25 season) Never done    The following portions of the patient's history were reviewed and updated as appropriate: allergies, current medications, past family history, past medical history, past social history, past surgical history, and problem list.  Review of Systems A comprehensive review  of systems was negative.  Objective:  BP 122/76   Pulse 74   Ht 5' 7 (1.702 m)   Wt 247 lb 9.6 oz (112.3 kg)   LMP 12/23/2022 Comment: ?12/23/22  Breastfeeding Yes   BMI 38.78 kg/m    General:  alert and no distress   Breasts:  normal  Lungs: clear to auscultation bilaterally  Heart:  regular rate and rhythm, S1, S2 normal, no murmur, click, rub or gallop  Abdomen: soft, non-tender; bowel sounds normal; no masses,  no organomegaly   Wound none  GU exam:  not indicated       Assessment:    1. Postpartum care following vaginal delivery (Primary)  2. Encounter for other general counseling and advice on contraception - undecided    Plan:   Follow up in 4 weeks  Essential components of care per ACOG recommendations:  1.  Mood and well being: Patient with negative depression screening today. Reviewed local resources for support.  - Patient tobacco use? No.   - hx of drug use? No.    2. Infant care and feeding:  -Patient currently breastmilk feeding? Yes. Discussed returning to work and pumping. Reviewed importance of draining breast regularly to support lactation.  Needs note to return to work on 11/03/2023. -Social determinants of health (SDOH) reviewed in EPIC. No concerns.  3. Sexuality,  contraception and birth spacing - Patient does not want a pregnancy in the next year.  Desired family size is 2 children.  - Reviewed reproductive life planning. Reviewed contraceptive methods based on pt preferences and effectiveness.  Patient desired Abstinence today.   - Discussed birth spacing of 18 months  4. Sleep and fatigue -Encouraged family/partner/community support of 4 hrs of uninterrupted sleep to help with mood and fatigue  5. Physical Recovery  - Discussed patients delivery and complications. She describes her labor as good. - Patient had a Vaginal, no problems at delivery. Patient had a 2nd degree laceration. Perineal healing reviewed. Patient expressed understanding -  Patient has urinary incontinence? No. - Patient is not safe to resume physical and sexual activity  6.  Health Maintenance - HM due items addressed Yes - Last pap smear  Diagnosis  Date Value Ref Range Status  06/04/2021   Final   - Negative for intraepithelial lesion or malignancy (NILM)   Pap smear not done at today's visit.  -Breast Cancer screening indicated? No.   7. Chronic Disease/Pregnancy Condition follow up: None   [facility], MD [facility], [facility], [place] 10/31/2023

## 2023-11-28 ENCOUNTER — Ambulatory Visit: Admitting: Family Medicine

## 2024-02-18 ENCOUNTER — Encounter: Payer: Self-pay | Admitting: Neurology

## 2024-05-26 ENCOUNTER — Ambulatory Visit: Admitting: Neurology
